# Patient Record
Sex: Female | Born: 1960 | Race: White | Hispanic: No | Marital: Married | Smoking: Never smoker
Health system: Southern US, Community
[De-identification: ages and names within clinical notes are randomized; demographics above are authoritative.]

## PROBLEM LIST (undated history)

## (undated) DIAGNOSIS — G8929 Other chronic pain: Secondary | ICD-10-CM

## (undated) DIAGNOSIS — J189 Pneumonia, unspecified organism: Secondary | ICD-10-CM

## (undated) DIAGNOSIS — R51 Headache: Secondary | ICD-10-CM

## (undated) DIAGNOSIS — M12812 Other specific arthropathies, not elsewhere classified, left shoulder: Secondary | ICD-10-CM

## (undated) DIAGNOSIS — R002 Palpitations: Secondary | ICD-10-CM

## (undated) DIAGNOSIS — J45909 Unspecified asthma, uncomplicated: Secondary | ICD-10-CM

## (undated) DIAGNOSIS — G35 Multiple sclerosis: Secondary | ICD-10-CM

## (undated) DIAGNOSIS — R519 Headache, unspecified: Secondary | ICD-10-CM

## (undated) DIAGNOSIS — F419 Anxiety disorder, unspecified: Secondary | ICD-10-CM

## (undated) DIAGNOSIS — K589 Irritable bowel syndrome without diarrhea: Secondary | ICD-10-CM

## (undated) HISTORY — PX: ROTATOR CUFF REPAIR: SHX139

## (undated) HISTORY — PX: CHOLECYSTECTOMY: SHX55

## (undated) HISTORY — PX: OTHER SURGICAL HISTORY: SHX169

## (undated) HISTORY — PX: ABDOMINAL HYSTERECTOMY: SHX81

## (undated) HISTORY — PX: APPENDECTOMY: SHX54

---

## 2001-03-21 ENCOUNTER — Ambulatory Visit (HOSPITAL_COMMUNITY): Admission: AD | Admit: 2001-03-21 | Discharge: 2001-03-21 | Payer: Self-pay | Admitting: *Deleted

## 2001-04-09 ENCOUNTER — Inpatient Hospital Stay (HOSPITAL_COMMUNITY): Admission: RE | Admit: 2001-04-09 | Discharge: 2001-04-12 | Payer: Self-pay | Admitting: Obstetrics and Gynecology

## 2001-10-01 ENCOUNTER — Emergency Department (HOSPITAL_COMMUNITY): Admission: EM | Admit: 2001-10-01 | Discharge: 2001-10-01 | Payer: Self-pay | Admitting: Emergency Medicine

## 2002-03-27 ENCOUNTER — Encounter: Payer: Self-pay | Admitting: Internal Medicine

## 2002-03-27 ENCOUNTER — Inpatient Hospital Stay (HOSPITAL_COMMUNITY): Admission: EM | Admit: 2002-03-27 | Discharge: 2002-04-01 | Payer: Self-pay | Admitting: Internal Medicine

## 2002-03-31 ENCOUNTER — Encounter: Payer: Self-pay | Admitting: Internal Medicine

## 2002-05-13 ENCOUNTER — Ambulatory Visit (HOSPITAL_COMMUNITY): Admission: RE | Admit: 2002-05-13 | Discharge: 2002-05-13 | Payer: Self-pay | Admitting: Internal Medicine

## 2002-05-16 ENCOUNTER — Emergency Department (HOSPITAL_COMMUNITY): Admission: EM | Admit: 2002-05-16 | Discharge: 2002-05-16 | Payer: Self-pay | Admitting: Emergency Medicine

## 2002-10-03 ENCOUNTER — Emergency Department (HOSPITAL_COMMUNITY): Admission: EM | Admit: 2002-10-03 | Discharge: 2002-10-04 | Payer: Self-pay | Admitting: Emergency Medicine

## 2002-10-03 ENCOUNTER — Encounter: Payer: Self-pay | Admitting: Emergency Medicine

## 2002-10-11 ENCOUNTER — Encounter: Payer: Self-pay | Admitting: Emergency Medicine

## 2002-10-11 ENCOUNTER — Emergency Department (HOSPITAL_COMMUNITY): Admission: EM | Admit: 2002-10-11 | Discharge: 2002-10-11 | Payer: Self-pay | Admitting: Emergency Medicine

## 2003-02-24 ENCOUNTER — Emergency Department (HOSPITAL_COMMUNITY): Admission: EM | Admit: 2003-02-24 | Discharge: 2003-02-24 | Payer: Self-pay | Admitting: Emergency Medicine

## 2003-02-24 ENCOUNTER — Encounter: Payer: Self-pay | Admitting: Emergency Medicine

## 2003-07-10 ENCOUNTER — Encounter: Payer: Self-pay | Admitting: Emergency Medicine

## 2003-07-10 ENCOUNTER — Emergency Department (HOSPITAL_COMMUNITY): Admission: EM | Admit: 2003-07-10 | Discharge: 2003-07-10 | Payer: Self-pay | Admitting: Emergency Medicine

## 2003-09-23 ENCOUNTER — Ambulatory Visit (HOSPITAL_COMMUNITY): Admission: RE | Admit: 2003-09-23 | Discharge: 2003-09-23 | Payer: Self-pay | Admitting: Obstetrics and Gynecology

## 2003-11-17 ENCOUNTER — Emergency Department (HOSPITAL_COMMUNITY): Admission: EM | Admit: 2003-11-17 | Discharge: 2003-11-17 | Payer: Self-pay | Admitting: Emergency Medicine

## 2004-03-14 ENCOUNTER — Emergency Department (HOSPITAL_COMMUNITY): Admission: EM | Admit: 2004-03-14 | Discharge: 2004-03-15 | Payer: Self-pay | Admitting: Emergency Medicine

## 2004-06-01 ENCOUNTER — Inpatient Hospital Stay (HOSPITAL_COMMUNITY): Admission: RE | Admit: 2004-06-01 | Discharge: 2004-06-04 | Payer: Self-pay | Admitting: Obstetrics & Gynecology

## 2005-05-23 ENCOUNTER — Emergency Department (HOSPITAL_COMMUNITY): Admission: EM | Admit: 2005-05-23 | Discharge: 2005-05-23 | Payer: Self-pay | Admitting: Emergency Medicine

## 2005-07-26 ENCOUNTER — Ambulatory Visit (HOSPITAL_COMMUNITY): Admission: RE | Admit: 2005-07-26 | Discharge: 2005-07-26 | Payer: Self-pay | Admitting: Family Medicine

## 2005-08-18 ENCOUNTER — Ambulatory Visit (HOSPITAL_COMMUNITY): Admission: RE | Admit: 2005-08-18 | Discharge: 2005-08-18 | Payer: Self-pay | Admitting: Family Medicine

## 2005-09-07 ENCOUNTER — Ambulatory Visit (HOSPITAL_COMMUNITY): Admission: RE | Admit: 2005-09-07 | Discharge: 2005-09-07 | Payer: Self-pay | Admitting: Obstetrics & Gynecology

## 2006-01-28 ENCOUNTER — Emergency Department (HOSPITAL_COMMUNITY): Admission: EM | Admit: 2006-01-28 | Discharge: 2006-01-28 | Payer: Self-pay | Admitting: Emergency Medicine

## 2006-03-29 ENCOUNTER — Emergency Department (HOSPITAL_COMMUNITY): Admission: EM | Admit: 2006-03-29 | Discharge: 2006-03-29 | Payer: Self-pay | Admitting: Emergency Medicine

## 2007-03-09 ENCOUNTER — Encounter: Admission: RE | Admit: 2007-03-09 | Discharge: 2007-03-09 | Payer: Self-pay | Admitting: Obstetrics and Gynecology

## 2007-09-24 ENCOUNTER — Emergency Department (HOSPITAL_COMMUNITY): Admission: EM | Admit: 2007-09-24 | Discharge: 2007-09-24 | Payer: Self-pay | Admitting: Emergency Medicine

## 2008-05-26 ENCOUNTER — Emergency Department (HOSPITAL_COMMUNITY): Admission: EM | Admit: 2008-05-26 | Discharge: 2008-05-26 | Payer: Self-pay | Admitting: Emergency Medicine

## 2008-11-15 ENCOUNTER — Emergency Department (HOSPITAL_COMMUNITY): Admission: EM | Admit: 2008-11-15 | Discharge: 2008-11-15 | Payer: Self-pay | Admitting: Emergency Medicine

## 2008-11-17 ENCOUNTER — Inpatient Hospital Stay (HOSPITAL_COMMUNITY): Admission: AD | Admit: 2008-11-17 | Discharge: 2008-11-23 | Payer: Self-pay | Admitting: Family Medicine

## 2009-08-11 ENCOUNTER — Emergency Department (HOSPITAL_COMMUNITY): Admission: EM | Admit: 2009-08-11 | Discharge: 2009-08-11 | Payer: Self-pay | Admitting: Emergency Medicine

## 2009-11-19 ENCOUNTER — Emergency Department (HOSPITAL_COMMUNITY): Admission: EM | Admit: 2009-11-19 | Discharge: 2009-11-19 | Payer: Self-pay | Admitting: Emergency Medicine

## 2010-04-30 ENCOUNTER — Emergency Department (HOSPITAL_COMMUNITY): Admission: EM | Admit: 2010-04-30 | Discharge: 2010-04-30 | Payer: Self-pay | Admitting: Emergency Medicine

## 2010-08-17 ENCOUNTER — Emergency Department (HOSPITAL_COMMUNITY)
Admission: EM | Admit: 2010-08-17 | Discharge: 2010-08-17 | Payer: Self-pay | Source: Home / Self Care | Admitting: Emergency Medicine

## 2010-09-08 ENCOUNTER — Encounter: Admission: RE | Admit: 2010-09-08 | Discharge: 2010-09-08 | Payer: Self-pay | Admitting: Specialist

## 2010-09-27 ENCOUNTER — Encounter
Admission: RE | Admit: 2010-09-27 | Discharge: 2010-10-27 | Payer: Self-pay | Source: Home / Self Care | Attending: Specialist | Admitting: Specialist

## 2010-12-05 ENCOUNTER — Encounter: Payer: Self-pay | Admitting: Obstetrics and Gynecology

## 2011-02-18 LAB — BASIC METABOLIC PANEL
BUN: 10 mg/dL (ref 6–23)
CO2: 27 mEq/L (ref 19–32)
Calcium: 9.7 mg/dL (ref 8.4–10.5)
Chloride: 105 mEq/L (ref 96–112)
Creatinine, Ser: 0.93 mg/dL (ref 0.4–1.2)
GFR calc Af Amer: >60 mL/min (ref >60)
GFR calc non Af Amer: >60 mL/min (ref >60)
Glucose, Bld: 98 mg/dL (ref 70–99)
Potassium: 4 mEq/L (ref 3.5–5.1)
Sodium: 141 mEq/L (ref 135–145)

## 2011-02-18 LAB — CBC
HCT: 43.9 % (ref 36.0–46.0)
Hemoglobin: 15.1 g/dL — ABNORMAL HIGH (ref 12.0–15.0)
MCHC: 34.3 g/dL (ref 30.0–36.0)
MCV: 89 fL (ref 78.0–100.0)
Platelets: 289 10*3/uL (ref 150–400)
RBC: 4.94 MIL/uL (ref 3.87–5.11)
RDW: 13.4 % (ref 11.5–15.5)
WBC: 8.8 10*3/uL (ref 4.0–10.5)

## 2011-02-18 LAB — POCT CARDIAC MARKERS
CKMB, poc: <1 ng/mL — ABNORMAL LOW (ref 1.0–8.0)
Myoglobin, poc: 68.9 ng/mL (ref 12–200)
Troponin i, poc: <0.05 ng/mL (ref 0.00–0.09)

## 2011-02-18 LAB — D-DIMER, QUANTITATIVE: D-Dimer, Quant: 0.57 ug/mL-FEU — ABNORMAL HIGH (ref 0.00–0.48)

## 2011-02-18 LAB — DIFFERENTIAL
Basophils Absolute: 0.1 10*3/uL (ref 0.0–0.1)
Basophils Relative: 1 % (ref 0–1)
Eosinophils Absolute: 0.1 10*3/uL (ref 0.0–0.7)
Eosinophils Relative: 2 % (ref 0–5)
Lymphocytes Relative: 30 % (ref 12–46)
Lymphs Abs: 2.7 10*3/uL (ref 0.7–4.0)
Monocytes Absolute: 0.5 10*3/uL (ref 0.1–1.0)
Monocytes Relative: 5 % (ref 3–12)
Neutro Abs: 5.4 10*3/uL (ref 1.7–7.7)
Neutrophils Relative %: 62 % (ref 43–77)

## 2011-02-18 LAB — HEPATIC FUNCTION PANEL
ALT: 17 U/L (ref 0–35)
Alkaline Phosphatase: 88 U/L (ref 39–117)
Bilirubin, Direct: 0.1 mg/dL (ref 0.0–0.3)
Total Bilirubin: 0.7 mg/dL (ref 0.3–1.2)

## 2011-02-18 LAB — URINALYSIS, ROUTINE W REFLEX MICROSCOPIC
Bilirubin Urine: NEGATIVE
Ketones, ur: NEGATIVE mg/dL
Nitrite: NEGATIVE
Urobilinogen, UA: 0.2 mg/dL (ref 0.0–1.0)

## 2011-02-28 LAB — CBC
HCT: 45.6 % (ref 36.0–46.0)
MCHC: 33.1 g/dL (ref 30.0–36.0)
MCV: 88.1 fL (ref 78.0–100.0)
MCV: 89.8 fL (ref 78.0–100.0)
Platelets: 251 10*3/uL (ref 150–400)
Platelets: 329 10*3/uL (ref 150–400)
RDW: 13.3 % (ref 11.5–15.5)
RDW: 13.6 % (ref 11.5–15.5)
WBC: 10.4 10*3/uL (ref 4.0–10.5)
WBC: 23.4 10*3/uL — ABNORMAL HIGH (ref 4.0–10.5)

## 2011-02-28 LAB — GLUCOSE, CAPILLARY
Glucose-Capillary: 133 mg/dL — ABNORMAL HIGH (ref 70–99)
Glucose-Capillary: 141 mg/dL — ABNORMAL HIGH (ref 70–99)
Glucose-Capillary: 142 mg/dL — ABNORMAL HIGH (ref 70–99)
Glucose-Capillary: 143 mg/dL — ABNORMAL HIGH (ref 70–99)
Glucose-Capillary: 149 mg/dL — ABNORMAL HIGH (ref 70–99)
Glucose-Capillary: 152 mg/dL — ABNORMAL HIGH (ref 70–99)
Glucose-Capillary: 154 mg/dL — ABNORMAL HIGH (ref 70–99)
Glucose-Capillary: 169 mg/dL — ABNORMAL HIGH (ref 70–99)
Glucose-Capillary: 170 mg/dL — ABNORMAL HIGH (ref 70–99)
Glucose-Capillary: 174 mg/dL — ABNORMAL HIGH (ref 70–99)
Glucose-Capillary: 179 mg/dL — ABNORMAL HIGH (ref 70–99)
Glucose-Capillary: 185 mg/dL — ABNORMAL HIGH (ref 70–99)
Glucose-Capillary: 188 mg/dL — ABNORMAL HIGH (ref 70–99)

## 2011-02-28 LAB — BLOOD GAS, ARTERIAL
Bicarbonate: 23.8 mEq/L (ref 20.0–24.0)
FIO2: 0.21 %
TCO2: 19.9 mmol/L (ref 0–100)
pCO2 arterial: 31.4 mmHg — ABNORMAL LOW (ref 35.0–45.0)
pH, Arterial: 7.492 — ABNORMAL HIGH (ref 7.350–7.400)

## 2011-02-28 LAB — DIFFERENTIAL
Basophils Absolute: 0 10*3/uL (ref 0.0–0.1)
Basophils Absolute: 0.4 10*3/uL — ABNORMAL HIGH (ref 0.0–0.1)
Basophils Relative: 0 % (ref 0–1)
Basophils Relative: 2 % — ABNORMAL HIGH (ref 0–1)
Eosinophils Absolute: 0 10*3/uL (ref 0.0–0.7)
Eosinophils Absolute: 0.1 10*3/uL (ref 0.0–0.7)
Eosinophils Relative: 0 % (ref 0–5)
Eosinophils Relative: 1 % (ref 0–5)
Lymphs Abs: 1.8 10*3/uL (ref 0.7–4.0)
Lymphs Abs: 2.8 10*3/uL (ref 0.7–4.0)
Neutrophils Relative %: 67 % (ref 43–77)
Neutrophils Relative %: 86 % — ABNORMAL HIGH (ref 43–77)

## 2011-02-28 LAB — BASIC METABOLIC PANEL
BUN: 11 mg/dL (ref 6–23)
BUN: 14 mg/dL (ref 6–23)
Chloride: 104 mEq/L (ref 96–112)
Chloride: 106 mEq/L (ref 96–112)
Creatinine, Ser: 0.91 mg/dL (ref 0.4–1.2)
Creatinine, Ser: 1 mg/dL (ref 0.4–1.2)
GFR calc Af Amer: >60 mL/min (ref >60)
GFR calc non Af Amer: 60 mL/min — ABNORMAL LOW (ref >60)
Glucose, Bld: 87 mg/dL (ref 70–99)
Potassium: 3.5 mEq/L (ref 3.5–5.1)

## 2011-02-28 LAB — CULTURE, BLOOD (ROUTINE X 2)
Culture: NO GROWTH
Report Status: 1092010

## 2011-03-29 NOTE — Group Therapy Note (Signed)
NAME:  Kristine Miller, Kristine Miller                ACCOUNT NO.:  [id number]   MEDICAL RECORD NO.:  [id number]          PATIENT TYPE:  INP   LOCATION:  A301                          FACILITY:  [facility]   PHYSICIAN:  Scott A. Gerda Diss, MD    DATE OF BIRTH:  19-Feb-1961   DATE OF PROCEDURE:  DATE OF DISCHARGE:                                 PROGRESS NOTE   Tia Hieronymus, she is actually improving today.  Her lungs sound better.  She does not have any wheezing.  Her sugars are a little high.  I really  think that is related to her steroids.  We are going to actually stop  the IV steroids and get a p.o. steroid.  Her sats initially was 91, when  she took a few deep breaths, it went on to 96.  Her lab work from  yesterday and x-ray actually showed improvement in the pneumonia.  I  think it is possible for her to go home later today, if her sats are  doing well.  We will see how things are going.  We will do a Humulin  regular insulin coverage; just in case, she needs it for a sliding scale  at meal times.  Now, albuterol neb treatments, she has a neb machine  that she can borrow.  She  b.i.d.  She will follow up with [place] later in  the weekend.      Scott A. Gerda Diss, MD  Electronically Signed     SAL/MEDQ  D:  11/22/2008  T:  11/22/2008  Job:  [id number]

## 2011-03-29 NOTE — H&P (Signed)
NAME:  Kristine Miller, Kristine Miller                ACCOUNT NO.:  [id number]   MEDICAL RECORD NO.:  [id number]          PATIENT TYPE:  INP   LOCATION:  A301                          FACILITY:  [facility]   PHYSICIAN:  Scott A. Gerda Diss, MD    DATE OF BIRTH:  December 09, 1960   DATE OF ADMISSION:  11/17/2008  DATE OF DISCHARGE:  LH                              HISTORY & PHYSICAL   CHIEF COMPLAINT:  Coughing, difficulty breathing.   HISTORY OF PRESENT ILLNESS:  This patient who is 50 years old presents  with coughing and congestion for several days.  She has had this going  on for at least 6 days with a runny nose, headache, sore throat,  coughing, congestion and low grade fever.  She went to the emergency  department 2 days ago, had an x-ray done that showed a right middle lobe  pneumonia and they gave her an inhaler along with antibiotics and  instructed her to follow up here in the office.  Over the weekend, she  has used nebulizer treatment.  In addition to this, she has also used  antibiotics and she feels worse.  She feels lightheaded today like she  cannot breathe.  She feels nauseous, and she also states that she gets  short winded with minimal activity.  She relates she is urinating okay.  She feels hot and cold, but she has not been taking her temperature, and  she denies any abdominal pain.  She relates a little bit of a headache  but nothing severe.  No neck pain.   PAST MEDICAL HISTORY:  Has had asthma for several years, does not smoke  or drink.   PAST SURGICAL HISTORY:  1. Hysterectomy.  2. Gallbladder surgery.  3. Appendectomy.  4. History of C-sections remotely.   FAMILY HISTORY:  Colon cancer, hypertension, diabetes, heart disease and  cholesterol.   ALLERGIES:  MORPHINE AND PENICILLIN CAUSING NAUSEA AND VOMITING.   MEDICATIONS:  She states currently has been taking a Z-pack as well as  albuterol p.r.n.   REVIEW OF SYSTEMS:  See per above.   PHYSICAL EXAMINATION:  GENERAL:  Looks to  feel ill.  Is tachypneic in  the office, breathing in the mid 20s.  Audible expiratory wheezes and  anxious look on her face.  HEENT:  TMs -NL.  T-NL.  NECK:  Supple.  CHEST:  Bilateral expiratory wheezes with some difficulty moving air.  HEART:  Regular.  ABDOMEN:  Soft.  EXTREMITIES:  No edema.  SKIN:  Warm, dry.  NEUROLOGIC:  Grossly normal.   STUDIES:  Chest x-ray from a couple days ago definitely showed right  middle lobe pneumonia.  Blood gas shows O2 sat 94, CO2 is a little low  pH is okay.  She was given a neb treatment in the office and continued  to have significant coughing, wheezing and shortness of breath.  She was  admitted because of failed outpatient management.   ASSESSMENT/PLAN:  Pneumonia with reactive airway.  Solu-Medrol IV as  well as Levaquin IV plus also frequent albuterol treatments.  Monitor  closely.  Xanax  as needed for nerves.  Lab work ordered, pending  currently.  Expect the patient to the in the hospital at least for the  next few days and we will go from there.      Scott A. Gerda Diss, MD  Electronically Signed     SAL/MEDQ  D:  11/17/2008  T:  11/18/2008  Job:  [id number]

## 2011-03-29 NOTE — Discharge Summary (Signed)
NAME:  Kristine Miller, Kristine Miller                ACCOUNT NO.:  [id number]   MEDICAL RECORD NO.:  [id number]          PATIENT TYPE:  INP   LOCATION:  A301                          FACILITY:  [facility]   PHYSICIAN:  Scott A. Gerda Diss, MD    DATE OF BIRTH:  Nov 05, 1961   DATE OF ADMISSION:  11/17/2008  DATE OF DISCHARGE:  01/10/2010LH                               DISCHARGE SUMMARY   DISCHARGE DIAGNOSES:  1. Pneumonia.  2. Reactive airway.  3. Hypoxia.   HOSPITAL COURSE:  The patient was admitted with cough, congestion,  wheezing, and difficulty breathing, was admitted in and placed on  antibiotics, steroids, and frequent neb treatments.  The patient did  have a hyperglycemic effect and required some insulin.  The patient  gradually improved over the course of the next few days.  She was  exceptionally tight for multiple days and then finally on Friday and  Saturday, the lungs started improving on Saturday.  We were able to  taper off the steroids and on Sunday, she was felt stable to go home,  went home on doxycycline b.i.d. for 7 days, sliding scale as needed, and  albuterol frequent treatments and follow up in our office.  She was  instructed to call us if any particular problems.  Otherwise, followup  in the office in midportion of the week.      Scott A. Gerda Diss, MD  Electronically Signed     SAL/MEDQ  D:  11/23/2008  T:  11/23/2008  Job:  [id number]

## 2011-04-01 NOTE — Discharge Summary (Signed)
[facility]  Patient:    Kristine Miller, Kristine Miller Visit Number: [id number] MRN: [id number]          Service Type: MED Location: 2A A219 01 Attending Physician:  Evlyn Courier Dictated by:   Mirna Mires, M.D. Admit Date:  03/27/2002 Discharge Date: 04/01/2002                             Discharge Summary  INCOMPLETE Dictated by:   Mirna Mires, M.D. Attending Physician:  Evlyn Courier DD:  04/03/02 TD:  04/05/02 Job: [id number]

## 2011-04-01 NOTE — H&P (Signed)
NAME:  MITCHELL, EPLING                ACCOUNT NO.:  [id number]   MEDICAL RECORD NO.:  [id number]          PATIENT TYPE:  AMB   LOCATION:  DAY                           FACILITY:  [facility]   PHYSICIAN:  Lazaro Arms, M.D.   DATE OF BIRTH:  [birth year]   DATE OF ADMISSION:  08/31/2005  DATE OF DISCHARGE:  LH                                HISTORY & PHYSICAL   Senaida is a 50 year old white female status post TAH and BSO in July 2005 who  presented sometime ago with complaint of pelvic pain.  She presented to  [facility] and had a negative workup.  They did a CT scan  which revealed a fluid collection in the left pelvis of 7.2 x 11.2 cm.  I am  suspicious at this point that this is a peritoneal occlusion cyst.  She had  some white cells in her urine treated with Cipro, and she really has gotten  no better.  CA-125 is normal.  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  The patient understands this is a result of adhesions and  fluid collection between the adhesions of bowel and pelvic sidewall and  peritoneal surfaces.  She also understands this may actually worsen the  condition long term, but she wants to try laparoscopic correction.   PAST MEDICAL HISTORY:  Negative.   PAST SURGICAL HISTORY:  1.  C section x2.  2.  Appendectomy.  3.  D&C x2.  4.  Laparoscopy for ectopic.  5.  Two arm surgeries.  6.  At the time of TAH-BSO, she did have quite a bit of adhesive disease.   ALLERGIES:  AMOXICILLIN, PENICILLIN, and MORPHINE.   REVIEW OF SYSTEMS:  As per HPI, otherwise negative.   PHYSICAL EXAMINATION:  HEENT: Unremarkable.  NECK:  Thyroid is normal.  LUNGS: Clear.  HEART:  Regular rate and rhythm without murmur, regurgitation, or gallop.  BREASTS:  Without masses, discharge, or skin changes.  ABDOMEN:  Benign without hepatosplenomegaly or masses.  PELVIC: She has normal external  genitalia.  Vagina clean, without discharge.  Vaginal cuff is intact but tender to palpation, and the mass is palpable on  examination.  It is sort of soft and pliable, consistent with a peritoneal  occlusion cyst.  EXTREMITIES:  Warm with no edema.  NEUROLOGIC: Exam is grossly intact.   IMPRESSION:  1.  Multiseptated pelvic mass status post total abdominal hysterectomy and      bilateral salpingo-oophorectomy.  2.  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  3.  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   PLAN:  The patient is admitted for laparoscopic evaluation and appropriate  surgical management of this.  She understands there are probably extensive  adhesions resulting in the fluid collection  She also knows I will try to  avoid doing a laparotomy, but that could become necessary should I be unable  to take care of it laparoscopically.      Omelia Blackwater  Lauretta Chester, M.D.  Electronically Signed     LHE/MEDQ  D:  09/06/2005  T:  09/06/2005  Job:  [id number]

## 2011-04-01 NOTE — Procedures (Signed)
[facility]  Patient:    Kristine Miller, Kristine Miller Visit Number: [id number] MRN: [id number]          Service Type: MED Location: 2A A219 01 Attending Physician:  Evlyn Courier Dictated by:   Kari Baars, M.D. Proc. Date: 03/27/02 Admit Date:  03/27/2002 Discharge Date: 04/01/2002                            EKG Interpretations  DATE:  Mar 27, 2002, at 1705 hours.  PROCEDURE:  Electrocardiogram  PHYSICIAN:  Dr. Kari Baars  DESCRIPTION OF PROCEDURE:  The rhythm is a sinus tachycardia with a rate of about 105.  There are Q-waves inferiorly which is possibly indicative of an inferior myocardial infarction of undetermined age.  There are also Q-waves laterally, which may be secondary to a lateral myocardial infarction, of undetermined age.  IMPRESSION:  Abnormal electrocardiogram. Dictated by:   Kari Baars, M.D. Attending Physician:  Evlyn Courier DD:  04/20/02 TD:  04/22/02 Job: [id number]

## 2011-04-01 NOTE — Op Note (Signed)
NAME:  Kristine Miller, Kristine Miller                            ACCOUNT NO.:  [id number]   MEDICAL RECORD NO.:  [id number]                  PATIENT TYPE:   LOCATION:                                       FACILITY:   PHYSICIAN:  Lazaro Arms, M.D.                DATE OF BIRTH:   DATE OF PROCEDURE:  06/01/2004  DATE OF DISCHARGE:                                 OPERATIVE REPORT   PREOPERATIVE DIAGNOSES:  1. Pelvic pain.  2. Menometrorrhagia.  3. Dysmenorrhea.  4. Unresponsive to nonsteroidal antiinflammatory drugs and oral     contraceptive pills.   POSTOPERATIVE DIAGNOSES:  1. Pelvic pain.  2. Menometrorrhagia.  3. Dysmenorrhea.  4. Unresponsive to nonsteroidal antiinflammatory drugs and oral     contraceptive pills.   PROCEDURE:  Abdominal hysterectomy and removal of tubes and ovaries  bilaterally.   SURGEON:  Lazaro Arms, M.D.   ANESTHESIA:  General endotracheal.   FINDINGS:  The patient had a minimal adhesions from her previous C-sections.  She had what appeared to be normal ovaries bilaterally.  She did have a  densely adherent bladder to the lower uterine segment from her 2 c-sections,  but there were no other abnormalities noted in the pelvis or peritoneal  cavity.   DESCRIPTION OF PROCEDURE:  The patient was taken to the operating room and  placed in the supine position where she underwent general endotracheal  anesthesia.  The vagina was prepped and the Foley catheter was placed.  The  abdomen was prepped and draped in the usual sterile fashion.  A Pfannenstiel  skin incision was made and carried down sharply through the rectus fascia  which was scored in the midline and extended laterally.  The fascia was  taken off the muscles superiorly and inferiorly.  The muscles were divided.  The peritoneal cavity was entered.   The upper abdomen was packed away and a Balfour self-retaining retractor was  placed.  The uterus was grasped with the thyroid tenaculum, and the fundus.  The gyrus forceps coagulation system was used and the infundibulopelvic  ligament was clamped, coagulated, and cut bilaterally.  The round ligaments  were clamped, coagulated, and cut bilaterally.  The vesicouterine serosal  flap was created both sharply and bluntly; and, again, there dense adhesions  in the midline of the bladder to the lower uterine segment.  The uterine  vessels were skeletonized.  They were clamped, coagulated, and cut; and  serial pedicles were taken down the cervix through the cardinal ligament.  Each pedicle was clamped, coagulated, and cut.  The vaginal angles were  clamped with Heaney forceps and vaginal angle sutures were placed.  The  vagina was closed with interrupted figure-of-eight sutures.  There was good  hemostasis.   The peritoneal cavity was irrigated vigorously.  All pedicles were found to  be hemostatic. The lap tapes and the Balfour self-retaining retractor was  removed.  INTERCEED was placed in the vaginal cuff to prevent postoperative  adhesions.  The muscles were reapproximated loosely.  The fascia was closed  using #0 Vicryl.  A subcu pain pump was placed for postoperative pain  management.  The subcu tissue was reapproximated loosely.  The skin was  closed using skin staples.  The patient tolerated the procedure well.  She  experienced 400 cc of blood loss and was taken to the recovery room in good  stable condition.  All counts were correct.      ___________________________________________                                            Lazaro Arms, M.D.   LHE/MEDQ  D:  06/01/2004  T:  06/01/2004  Job:  [id number]

## 2011-04-01 NOTE — Op Note (Signed)
NAME:  Kristine Miller, Kristine Miller                ACCOUNT NO.:  [id number]   MEDICAL RECORD NO.:  [id number]          PATIENT TYPE:  AMB   LOCATION:  DAY                           FACILITY:  [facility]   PHYSICIAN:  Lazaro Arms, M.D.   DATE OF BIRTH:  May 11, 1961   DATE OF PROCEDURE:  09/07/2005  DATE OF DISCHARGE:                                 OPERATIVE REPORT   PREOPERATIVE DIAGNOSES:  1.  Large, multiloculated cystic pelvic mass.  2.  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  3.  Probable pelvic abdominal adhesive disease.   PROCEDURE:  Operative laparoscopy with extensive lysis of adhesions and  excision of peritoneal inclusive cyst.   SURGEON:  Dr. Despina Hidden.   ANESTHESIA:  General endotracheal.   FINDINGS:  The patient had omentum adherent to the anterior abdominal wall.  She also had adhesions of the sigmoid colon and rectum to the bladder, the  left pelvic side wall and small bowel adherent to the right pelvic side  wall. It was indeed a peritoneal inclusion cyst. It was just loculated, thin  membrane by peritoneum and straw-colored serous fluid. It was opened up and  drained and adhesions taken down extensively.   DESCRIPTION OF OPERATION:  The patient was taken to the operating room and  placed in a supine position where she underwent general endotracheal  anesthesia. She was placed in the dorsal lithotomy position, prepped and  draped in the usual sterile fashion. A sterile sponge stick was placed in  the vagina for manipulation if needed. An incision was made in the  umbilicus, and an open laparoscopy was performed. Dissection was taken down  to the fascia, and the fascia was cut, and the peritoneum was entered  bluntly. The peritoneal cavity was insufflated, and two additional trocars  were placed two fingerbreadths above the pubis in the midline and in the  left lower quadrant, both under direct visualization. The above noted  findings were seen. Adhesions of the anterior abdominal wall  and the omentum  were taken down with the harmonic scalpel. There was adhesions of the ileum  on the right pelvic side wall, and there were taken down bluntly and  sharply. Sigmoid colon and rectum were densely adherent to the bladder, and  these were taken down bluntly and with use of harmonic scalpel, careful of  course to avoid the mucosa. The peritoneal inclusion cyst basically went  from the left adnexa all the way down to the cul-de-sac from bladder to  rectum. It was opened up, and all of the fluid was removed. Peritoneal  membrane was excised back. The sigmoid was attempted to be removed from the  left pelvic side wall, but the adhesions were two dense. If this becomes a  problem again in the future, she is going to need to have an open  laparotomy. A large amount of saline was left in place to try to prevent  postoperative adhesive disease from reoccurring and causing peritoneal  inclusive cyst once again. All the instruments were removed from the  peritoneal cavity. The umbilical fascia was closed with 0 Vicryl,  and a subcuticular incision of 3-0 Vicryl was placed in the umbilicus. Skin  was closed with skin staples. The patient tolerated the procedure well. She  experienced minimal to no blood loss and was taken to the recovery room in  good and stable condition. All counts were correct.      Lazaro Arms, M.D.  Electronically Signed     LHE/MEDQ  D:  09/07/2005  T:  09/07/2005  Job:  [id number]

## 2011-04-01 NOTE — Discharge Summary (Signed)
NAME:  Kristine Miller, Kristine Miller                          ACCOUNT NO.:  [id number]   MEDICAL RECORD NO.:  [id number]                   PATIENT TYPE:  INP   LOCATION:  A427                                 FACILITY:  [facility]   PHYSICIAN:  Lazaro Arms, M.D.                DATE OF BIRTH:  [birth year]   DATE OF ADMISSION:  06/01/2004  DATE OF DISCHARGE:  06/04/2004                                 DISCHARGE SUMMARY   DISCHARGE DIAGNOSES:  1. Status post total abdominal hysterectomy bilateral salpingo-oophorectomy.  2. Unremarkable course.  3. Pathology report reveals adenomyosis of the uterus and hemorrhagic corpus     luteum cyst right ovary.   Please refer to the transcribed operative report as well as the handwritten  history and physical for details of admission to the hospital.   HOSPITAL COURSE:  The patient was admitted postoperatively.  She did quite  well. She tolerated clear liquids and a regular diet.  She voided without  symptoms.  She had return of bowel function with flatus and bowel movement.  Her incision remained clean, dry, and intact.  She tolerated the transition  from IV to oral pain medicine.  On postoperative day #1 her hemoglobin 11.8,  hematocrit 33.6 with a white count of 12,400.  On postop day #3 it was 11.0  and 31.2 and 10,200 respectively.  The patient was discharged to home the  morning of postop day #3 in good stable condition to follow up in the office  on Monday the 25th to have her incision examined and staples removed.  She  was given Tylox and Motrin for pain and Cipro as an antibiotic as she has  had wound infections with her previous cesarean section.  She was given  instruction precautions prior to that time.     ___________________________________________                                         Lazaro Arms, M.D.   Loraine Maple  D:  06/04/2004  T:  06/04/2004  Job:  [id number]

## 2011-08-23 LAB — CBC
HCT: 42.3
Hemoglobin: 14.4
MCHC: 34.1
RBC: 4.79
RDW: 13.4

## 2011-08-23 LAB — DIFFERENTIAL
Basophils Absolute: 0.1
Eosinophils Relative: 1
Lymphocytes Relative: 24
Monocytes Absolute: 0.7
Monocytes Relative: 6
Neutro Abs: 9.2 — ABNORMAL HIGH

## 2011-08-23 LAB — POCT CARDIAC MARKERS
CKMB, poc: <1 — ABNORMAL LOW
Troponin i, poc: <0.05

## 2011-08-23 LAB — BASIC METABOLIC PANEL
CO2: 28
Calcium: 9
GFR calc Af Amer: >60
GFR calc non Af Amer: >60
Glucose, Bld: 104 — ABNORMAL HIGH
Potassium: 3.6
Sodium: 138

## 2011-08-26 ENCOUNTER — Other Ambulatory Visit: Payer: Self-pay | Admitting: Adult Health

## 2011-09-21 ENCOUNTER — Inpatient Hospital Stay (HOSPITAL_COMMUNITY): Admission: RE | Admit: 2011-09-21 | Payer: Self-pay | Source: Ambulatory Visit

## 2011-09-21 ENCOUNTER — Ambulatory Visit (HOSPITAL_COMMUNITY)
Admission: RE | Admit: 2011-09-21 | Discharge: 2011-09-21 | Disposition: A | Discharge status: Home / Self Care | Payer: Self-pay | Source: Ambulatory Visit | Attending: Adult Health | Admitting: Adult Health

## 2011-09-21 DIAGNOSIS — N63 Unspecified lump in unspecified breast: Secondary | ICD-10-CM | POA: Insufficient documentation

## 2012-02-23 ENCOUNTER — Emergency Department (HOSPITAL_COMMUNITY)
Admission: EM | Admit: 2012-02-23 | Discharge: 2012-02-23 | Disposition: A | Discharge status: Home / Self Care | Payer: Self-pay | Attending: Emergency Medicine | Admitting: Emergency Medicine

## 2012-02-23 ENCOUNTER — Encounter (HOSPITAL_COMMUNITY): Payer: Self-pay | Admitting: Emergency Medicine

## 2012-02-23 DIAGNOSIS — H5710 Ocular pain, unspecified eye: Secondary | ICD-10-CM | POA: Insufficient documentation

## 2012-02-23 DIAGNOSIS — H1130 Conjunctival hemorrhage, unspecified eye: Secondary | ICD-10-CM | POA: Insufficient documentation

## 2012-02-23 MED ORDER — KETOROLAC TROMETHAMINE 0.5 % OP SOLN
1.0000 [drp] | Freq: Once | OPHTHALMIC | Status: AC
  Administered 2012-02-23: 1 [drp] via OPHTHALMIC
  Filled 2012-02-23: qty 5

## 2012-02-23 NOTE — Discharge Instructions (Signed)
Subconjunctival Hemorrhage A subconjunctival hemorrhage is a bright red patch covering a portion of the white of the eye. The white part of the eye is called the sclera, and it is covered by a thin membrane called the conjunctiva. This membrane is clear, except for tiny blood vessels that you can see with the naked eye. When your eye is irritated or inflamed and becomes red, it is because the vessels in the conjunctiva are swollen. Sometimes, a blood vessel in the conjunctiva can break and bleed. When this occurs, the blood builds up between the conjunctiva and the sclera, and spreads out to create a red area. The red spot may be very small at first. It may then spread to cover a larger part of the surface of the eye, or even all of the visible white part of the eye. In almost all cases, the blood will go away and the eye will become white again. Before completely dissolving, however, the red area may spread. It may also become brownish-yellow in color, before going away. If a lot of blood collects under the conjunctiva, it may look like a bulge on the surface of the eye. This looks scary, but it will also eventually flatten out and go away. Subconjunctival hemorrhages do not cause pain, but if swollen, may cause a feeling of irritation. There is no effect on vision.  CAUSES   The most common cause is mild trauma (rubbing the eye, irritation).   Subconjunctival hemorrhages can happen because of coughing or straining (lifting heavy objects), vomiting, or sneezing.   In some cases, your doctor may want to check your blood pressure. High blood pressure can also cause a sunconjunctival hemorrhage.   Severe trauma or blunt injuries.   Diseases that affect blood clotting (hemophilia, leukemia).   Abnormalities of blood vessels behind the eye (carotid cavernous sinus fistula).   Tumors behind the eye.   Certain drugs (aspirin, coumadin, heparin).   Recent eye surgery.  HOME CARE INSTRUCTIONS   Do  not worry about the appearance of your eye. You may continue your usual activities.   Often, follow-up is not necessary.  SEEK MEDICAL CARE IF:   Your eye becomes painful.   The bleeding does not disappear within 3 weeks.   Bleeding occurs elsewhere, for example, under the skin, in the mouth, or in the other eye.   You have recurring subconjunctival hemorrhages.  SEEK IMMEDIATE MEDICAL CARE IF:   Your vision changes or you have difficulty seeing.   You develop severe headache, persistent vomiting, confusion, or abnormal drowsiness (lethargy).   Your eye seems to bulge or protrude from the eye socket.   You notice the sudden appearance of bruises, or have spontaneous bleeding elsewhere on your body.  Document Released: 10/31/2005 Document Revised: 10/20/2011 Document Reviewed: 09/28/2009 [facility] Patient Information 2012 [place], [place].   i spoke with dr. Lita Mains.  He suggests cool or warm compresses for comfort.  Insert 1 drop of acular 4 times daily if needed for pain.  Call his office on Monday and make an appt for further evaluation of the eye.

## 2012-02-23 NOTE — ED Notes (Signed)
Pt states she woke up this am and had increased eye redness and swelling.

## 2012-02-23 NOTE — ED Provider Notes (Signed)
Medical screening examination/treatment/procedure(s) were performed by non-physician practitioner and as supervising physician I was immediately available for consultation/collaboration.  Donnetta Hutching, MD 02/23/12 1735

## 2012-02-23 NOTE — ED Provider Notes (Signed)
History     CSN: [id number]  Arrival date & time 02/23/12  1344   First MD Initiated Contact with Patient 02/23/12 1611      Chief Complaint  Patient presents with  . Eye Pain    (Consider location/radiation/quality/duration/timing/severity/associated sxs/prior treatment) HPI Comments: Denies any eye trauma.  No FB sensation.  No visual changes.  Not  Taking anticoagulants.  She has mild discomfort with movement of the eye and blinking.  She has had this occur 3 times in the past few years.  Has not seen an ophthalmologist.  Patient is a 51 y.o. female presenting with eye pain. The history is provided by the patient. No language interpreter was used.  Eye Pain This is a new problem. Episode onset: 6 days ago. The problem has been gradually worsening. Pertinent negatives include no headaches, nausea, visual change or vomiting. Exacerbated by: blinking and eye movement.    History reviewed. No pertinent past medical history.  Past Surgical History  Procedure Date  . Abdominal hysterectomy     History reviewed. No pertinent family history.  History  Substance Use Topics  . Smoking status: Not on file  . Smokeless tobacco: Not on file  . Alcohol Use: No    OB History    Grav Para Term Preterm Abortions TAB SAB Ect Mult Living                  Review of Systems  Eyes: Positive for pain and redness. Negative for photophobia, discharge, itching and visual disturbance.  Gastrointestinal: Negative for nausea and vomiting.  Neurological: Negative for headaches.  All other systems reviewed and are negative.    Allergies  Morphine and related and Penicillins  Home Medications   Current Outpatient Rx  Name Route Sig Dispense Refill  . ESTRADIOL 1 MG PO TABS Oral Take 1 mg by mouth daily.      BP 150/87  Pulse 80  Temp(Src) 98.1 F (36.7 C) (Oral)  Resp 20  Wt 230 lb (104.327 kg)  SpO2 97%  Physical Exam  Nursing note and vitals reviewed. Constitutional: She  is oriented to person, place, and time. She appears well-developed and well-nourished. No distress.  HENT:  Head: Normocephalic and atraumatic.  Eyes: EOM are normal. Pupils are equal, round, and reactive to light. Left eye exhibits no discharge, no exudate and no hordeolum. No foreign body present in the left eye. Left conjunctiva has a hemorrhage. No scleral icterus.    Neck: Normal range of motion.  Cardiovascular: Normal rate, regular rhythm and normal heart sounds.   Pulmonary/Chest: Effort normal and breath sounds normal.  Abdominal: Soft. She exhibits no distension. There is no tenderness.  Musculoskeletal: Normal range of motion.  Neurological: She is alert and oriented to person, place, and time.  Skin: Skin is warm and dry.  Psychiatric: She has a normal mood and affect. Judgment normal.    ED Course  Procedures (including critical care time)  Labs Reviewed - No data to display No results found.   1. Subconjunctival hemorrhage       MDM  Acular, 1 gtt QID OS x 5 days prn discomfort.   i spoke with dr. Lita Mains. He states that she does not need anything in particular but that she can apply cool or compresses for comfort and to reassure her.  Call his office on Monday and make an appt for next week.        Worthy Rancher, PA 02/23/12 1640  Worthy Rancher, PA 02/23/12 1652  Worthy Rancher, PA 02/23/12 [phone]

## 2012-02-23 NOTE — ED Notes (Signed)
Pt DC to home with steady gait 

## 2012-04-06 ENCOUNTER — Emergency Department (HOSPITAL_COMMUNITY): Payer: Medicaid Other

## 2012-04-06 ENCOUNTER — Encounter (HOSPITAL_COMMUNITY): Payer: Self-pay

## 2012-04-06 ENCOUNTER — Inpatient Hospital Stay (HOSPITAL_COMMUNITY)
Admission: EM | Admit: 2012-04-06 | Discharge: 2012-04-07 | DRG: 313 | Disposition: A | Discharge status: Home / Self Care | Payer: Medicaid Other | Attending: Internal Medicine | Admitting: Internal Medicine

## 2012-04-06 DIAGNOSIS — R002 Palpitations: Secondary | ICD-10-CM

## 2012-04-06 DIAGNOSIS — E876 Hypokalemia: Secondary | ICD-10-CM

## 2012-04-06 DIAGNOSIS — F411 Generalized anxiety disorder: Secondary | ICD-10-CM | POA: Diagnosis present

## 2012-04-06 DIAGNOSIS — I369 Nonrheumatic tricuspid valve disorder, unspecified: Secondary | ICD-10-CM

## 2012-04-06 DIAGNOSIS — R079 Chest pain, unspecified: Principal | ICD-10-CM | POA: Diagnosis present

## 2012-04-06 DIAGNOSIS — Z6841 Body Mass Index (BMI) 40.0 and over, adult: Secondary | ICD-10-CM

## 2012-04-06 DIAGNOSIS — E039 Hypothyroidism, unspecified: Secondary | ICD-10-CM | POA: Diagnosis present

## 2012-04-06 DIAGNOSIS — E669 Obesity, unspecified: Secondary | ICD-10-CM

## 2012-04-06 DIAGNOSIS — M79609 Pain in unspecified limb: Secondary | ICD-10-CM | POA: Diagnosis present

## 2012-04-06 HISTORY — DX: Irritable bowel syndrome, unspecified: K58.9

## 2012-04-06 HISTORY — DX: Other specific arthropathies, not elsewhere classified, left shoulder: M12.812

## 2012-04-06 LAB — CBC
HCT: 40.3 % (ref 36.0–46.0)
Hemoglobin: 13.6 g/dL (ref 12.0–15.0)
RBC: 4.6 MIL/uL (ref 3.87–5.11)

## 2012-04-06 LAB — CARDIAC PANEL(CRET KIN+CKTOT+MB+TROPI)
CK, MB: 1.5 ng/mL (ref 0.3–4.0)
Relative Index: INVALID (ref 0.0–2.5)
Total CK: 59 U/L (ref 7–177)

## 2012-04-06 LAB — BASIC METABOLIC PANEL
Chloride: 104 mEq/L (ref 96–112)
GFR calc Af Amer: >90 mL/min (ref >90)
GFR calc non Af Amer: >90 mL/min (ref >90)
Potassium: 2.9 mEq/L — ABNORMAL LOW (ref 3.5–5.1)
Sodium: 141 mEq/L (ref 135–145)

## 2012-04-06 LAB — TROPONIN I: Troponin I: <0.3 ng/mL (ref <0.30)

## 2012-04-06 LAB — PRO B NATRIURETIC PEPTIDE: Pro B Natriuretic peptide (BNP): 44.5 pg/mL (ref 0–125)

## 2012-04-06 LAB — MAGNESIUM: Magnesium: 2 mg/dL (ref 1.5–2.5)

## 2012-04-06 LAB — D-DIMER, QUANTITATIVE: D-Dimer, Quant: 0.7 ug/mL-FEU — ABNORMAL HIGH (ref 0.00–0.48)

## 2012-04-06 MED ORDER — SODIUM CHLORIDE 0.9 % IJ SOLN
3.0000 mL | Freq: Two times a day (BID) | INTRAMUSCULAR | Status: DC
  Administered 2012-04-06: 3 mL via INTRAVENOUS
  Filled 2012-04-06 (×2): qty 3

## 2012-04-06 MED ORDER — ONDANSETRON HCL 4 MG/2ML IJ SOLN: 4.0000 mg | Freq: Four times a day (QID) | INTRAMUSCULAR | Status: DC | PRN

## 2012-04-06 MED ORDER — ENOXAPARIN SODIUM 40 MG/0.4ML ~~LOC~~ SOLN
40.0000 mg | SUBCUTANEOUS | Status: DC
  Administered 2012-04-06: 40 mg via SUBCUTANEOUS
  Filled 2012-04-06: qty 0.4

## 2012-04-06 MED ORDER — IOHEXOL 350 MG/ML SOLN
100.0000 mL | Freq: Once | INTRAVENOUS | Status: AC | PRN
  Administered 2012-04-06: 100 mL via INTRAVENOUS

## 2012-04-06 MED ORDER — ASPIRIN 81 MG PO CHEW
324.0000 mg | CHEWABLE_TABLET | Freq: Once | ORAL | Status: AC
  Administered 2012-04-06: 324 mg via ORAL
  Filled 2012-04-06: qty 4

## 2012-04-06 MED ORDER — SODIUM CHLORIDE 0.9 % IV SOLN
INTRAVENOUS | Status: DC
  Administered 2012-04-06: 12:00:00 via INTRAVENOUS

## 2012-04-06 MED ORDER — NITROGLYCERIN 0.4 MG SL SUBL
0.4000 mg | SUBLINGUAL_TABLET | SUBLINGUAL | Status: AC | PRN
  Administered 2012-04-06 (×3): 0.4 mg via SUBLINGUAL
  Filled 2012-04-06: qty 25

## 2012-04-06 MED ORDER — LORAZEPAM 2 MG/ML IJ SOLN
1.0000 mg | Freq: Once | INTRAMUSCULAR | Status: AC
  Administered 2012-04-06: 1 mg via INTRAVENOUS
  Filled 2012-04-06: qty 1

## 2012-04-06 MED ORDER — ASPIRIN EC 325 MG PO TBEC
325.0000 mg | DELAYED_RELEASE_TABLET | Freq: Every day | ORAL | Status: DC
  Administered 2012-04-06 – 2012-04-07 (×2): 325 mg via ORAL
  Filled 2012-04-06 (×2): qty 1

## 2012-04-06 MED ORDER — POTASSIUM CHLORIDE CRYS ER 20 MEQ PO TBCR
40.0000 meq | EXTENDED_RELEASE_TABLET | ORAL | Status: AC
  Administered 2012-04-06 (×2): 40 meq via ORAL
  Filled 2012-04-06 (×2): qty 2

## 2012-04-06 MED ORDER — FENTANYL CITRATE 0.05 MG/ML IJ SOLN
12.5000 ug | Freq: Once | INTRAMUSCULAR | Status: AC
  Administered 2012-04-06: 12.5 ug via INTRAVENOUS
  Filled 2012-04-06: qty 2

## 2012-04-06 MED ORDER — HYDROMORPHONE HCL PF 1 MG/ML IJ SOLN
0.5000 mg | INTRAMUSCULAR | Status: DC | PRN
  Administered 2012-04-07: 0.5 mg via INTRAVENOUS
  Filled 2012-04-06: qty 1

## 2012-04-06 MED ORDER — ONDANSETRON HCL 4 MG PO TABS: 4.0000 mg | ORAL_TABLET | Freq: Four times a day (QID) | ORAL | Status: DC | PRN

## 2012-04-06 MED ORDER — ACETAMINOPHEN 325 MG PO TABS
650.0000 mg | ORAL_TABLET | Freq: Four times a day (QID) | ORAL | Status: DC | PRN
  Administered 2012-04-06: 650 mg via ORAL
  Filled 2012-04-06: qty 2

## 2012-04-06 MED ORDER — ACETAMINOPHEN 650 MG RE SUPP: 650.0000 mg | Freq: Four times a day (QID) | RECTAL | Status: DC | PRN

## 2012-04-06 NOTE — ED Notes (Signed)
Patient denies cp after 2nd nitro

## 2012-04-06 NOTE — Progress Notes (Signed)
*  PRELIMINARY RESULTS* Echocardiogram 2D Echocardiogram has been performed.  Caswell Corwin 04/06/2012, 2:19 PM

## 2012-04-06 NOTE — Progress Notes (Signed)
PT HAD ONE EPISODE OF SHAKING. SHE DESCRIBED AS " SHAKING ON THE INSIDE BUT NO ONE CAN SEE IT ON THE OUTSIDE ". IT LASTED ABOUT 5 MINUTES.

## 2012-04-06 NOTE — ED Notes (Signed)
Dr Kerry Hough at bedside for patient evaluation.

## 2012-04-06 NOTE — Consult Note (Signed)
CARDIOLOGY CONSULT NOTE  Patient ID: Kristine Miller MRN: [id number] DOB/AGE: 04-15-1961 51 y.o.  Admit date: 04/06/2012 Referring Physician: PTH Primary PhysicianLUKING,SCOTT, MD, MD Reason for Consultation: Chest pain, palpitations  Principal Problem:  *Chest pain Active Problems:  Palpitations  Obesity  Hypokalemia  HPI: Kristine Miller is a 51 y/o women with no prior cardiac history who was in her usual state of health when she noticed her heart racing with frequent palpitiations. She had been up playing video games with her son until around 3:30am Went to bed and began to feel her heart race with associated shortness of breath and chest pain.  Earlier in the day she had left arm pain, on and off without exertion. She ignored this. She has a history of rotator cuff repair and has occasional shoulder pain. When she felt her heart racing the left arm began to bother her more with tingling in the hands bilaterally.   On arrival to ER, BP 119/70, HR 86. She was given NTG with relief of symptoms. She was found to be hypokalemic with potassium of 2.9. Mildly elevated WBC of 11.7. D-dimer was elevated at 0.70 with negative CT scan for PE. U/A negative for UTI. Troponin is negative with EKG showing nonspecific T-wave abnormalities without acute ischemia. She has had potassium repletion. Currently without complaint.  Of note, she has a history of frequent diarrhea after eating. Was told she may have IBS but has not had official evaluation by GI. She states that she has loose stools 2-3 times a day, chronically.  Review of systems complete and found to be negative unless listed above   Past Medical History  Diagnosis Date  . Rotator cuff arthropathy, left   . IBS (irritable bowel syndrome)     Family History  Problem Relation Age of Onset  . Cancer Mother   . Diabetes Mother     History   Social History  . Marital Status: Married    Spouse Name: N/A    Number of Children: N/A  . Years  of Education: N/A   Occupational History  . Not on file.   Social History Main Topics  . Smoking status: Never Smoker   . Smokeless tobacco: Not on file  . Alcohol Use: No  . Drug Use: No  . Sexually Active:    Other Topics Concern  . 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    Past Surgical History  Procedure Date  . Abdominal hysterectomy   . Rotator cuff repair   . Cholecystectomy   . Appendectomy       Physical Exam: Blood pressure 126/65, pulse 72, temperature 98.8 F (37.1 C), temperature source Oral, resp. rate 19, height 5' (1.524 m), weight 228 lb (103.42 kg), SpO2 100.00%.   General: Well developed, well nourished, in no acute distress Head: Eyes PERRLA, No xanthomas.   Normal cephalic and atramatic  Lungs: Clear bilaterally to auscultation and percussion. Heart: HRRR S1 S2, without MRG.  Pulses are 2+ & equal.            No carotid bruit. No JVD.  No abdominal bruits. No femoral bruits. Abdomen: Bowel sounds are positive, abdomen soft and non-tender without masses or                  Hernia's noted. Msk:  Back normal, normal gait. Normal strength and tone for age.Soreness of the left shoulder with movement. Extremities: No  clubbing, cyanosis or edema.  DP +1 Neuro: Alert and oriented X 3. Psych:  Good affect, responds appropriately   Labs:   Lab Results  Component Value Date   WBC 11.7* 04/06/2012   HGB 13.6 04/06/2012   HCT 40.3 04/06/2012   MCV 87.6 04/06/2012   PLT 287 04/06/2012     Lab 04/06/12 0419  NA 141  K 2.9*  CL 104  CO2 24  BUN 9  CREATININE 0.76  CALCIUM 9.4  PROT --  BILITOT --  ALKPHOS --  ALT --  AST --  GLUCOSE 102*   Lab Results  Component Value Date   TROPONINI <0.30 04/06/2012        Radiology: Ct Angio Chest W/cm &/or Wo Cm  04/06/2012  *RADIOLOGY REPORT*  Clinical Data: Short of breath.  Chest pain.  Clinical suspicion for pulmonary embolism.  CT ANGIOGRAPHY CHEST    IMPRESSION: Negative.  No evidence of pulmonary embolism or other active disease.  Original Report Authenticated By: Danae Orleans, M.D.   Chest Portable 1 View  04/06/2012  *RADIOLOGY REPORT*  Clinical Data: Shortness of breath, chest pain.  PORTABLE CHEST - 1 VIEW    IMPRESSION: No radiographic evidence of acute cardiopulmonary process.  Original Report Authenticated By: Waneta Martins, M.D.   ZOX:WRUEAV sinus rhythm HR 86 bpm Nonspecific ST abnormality  ASSESSMENT AND PLAN:   1. Chest pain: Typical and atypical features with associated "racing heart" and palpitations and some left arm pain.Marland Kitchen She was found to be hypokalemic with potassium of 2.9. Troponin is negative, EKG showing nonspecific T-wave abnormalities but no ischemic changes. Recommend potassium replacement and continued cycling of cardiac enzymes and EKG  to r/o MI. Family history is negative for heart disease, and she has no other risk factors. If negative work-up can go home with follow-up in our office for stress test should symptoms persist.  2. Hypokalemia:  She states that she has chronic diarrhea with loose stools after most meals. She has had a cholecystectomy in the past. GI work-up with potassium replacement at home is recommended. Addition of bile acid sequestrant should be considered.   3. Left shoulder and arm pain: Can be musculoskeletal with known history of rotator cuff repair, vs related to hypokalemia. Follow-up with PCP for further testing if necessary.  Signed: Bettey Mare. Lyman Bishop NP [facility] 04/06/2012, 9:41 AM Co-Sign MD  Attending note:  Patient seen and examined. Reviewed records and database as recorded by Kristine Miller. Kristine Miller is a 51 year old obese woman with reported history of IBS, previous left rotator cuff surgery, presenting with sudden onset of palpitations in the early morning hours associated with shortness of breath and chest discomfort. She denies any prior history of  prolonged palpitations, or any typical exertional chest pain. When assessed in the ER, she was noted to be hypokalemic as outlined, ECG showing nonspecific changes with no dysrhythmia by monitoring. She did report improvement in her chest pain symptoms with nitroglycerin, still indicates that she feels short of breath when walking around to get up to go to the bathroom.  On examination she denies any current chest pain, is afebrile, respirations 19, heart rate in the 70s and sinus rhythm, blood pressure 126/65. Lungs are clear, no carotid bruits noted, cardiac exam with regular rate and rhythm and no significant murmur, no peripheral edema.  Lab work reviewed finding potassium 2.9, BUN 69, creatinine 0.7, sodium 141, troponin I normal, pro BNP 44, WBC 11.7, hemoglobin 13.6,  d-dimer 0.7. CT angiogram of the chest showed no evidence of pulmonary embolus, also no significant coronary artery calcifications were described with normal heart size. ECG reviewed showing nonspecific ST changes.  Patient is being admitted to the hospitalist service for observation. Would cycle cardiac markers, follow telemetry, and also proceed with an echocardiogram to evaluate LV function. Replete potassium - patient describes loose stools presumably in association with IBS. She may require a potassium supplement at baseline. From a cardiac perspective, if no dysrhythmia is uncovered, cardiac markers are normal, and she is clinically stable with no major echocardiographic abnormalities, anticipate that she could be scheduled for a followup outpatient stress test next week. Whether additional monitoring needs to be considered can also be discussed.  Jonelle Sidle, M.D., F.A.C.C.

## 2012-04-06 NOTE — ED Notes (Signed)
Patient ambulatory to restroom with steady gait.

## 2012-04-06 NOTE — ED Notes (Signed)
Patient transported to CT 

## 2012-04-06 NOTE — H&P (Signed)
PCP:   Lilyan Punt, MD, MD   Chief Complaint:  Chest pain  HPI: This is a 51 year old female who presents to the emergency room today with complaints of substernal chest pain. She describes the pain as a squeezing type pain in her mid chest which radiates to her left arm. Onset of her symptoms occurred approximately 3 AM this morning she was going to bed. She felt her heart pounding and reports very feeling very tremulous. Her pain was also associated with shortness of breath. These symptoms appear to have gotten worse and she tried to get up and move around. She denies any diaphoresis, fever, cough. She has not feel this was acid reflux related. She denies any prior episode of similar symptoms. She's never had any prior cardiac testing. She is a nonsmoker. She was evaluated in the emergency room and was found to have a non acute, and initial troponin was found to be negative.  She has been referred for admission  Allergies:   Allergies  Allergen Reactions  . Morphine And Related Swelling  . Penicillins Nausea And Vomiting     History reviewed. No pertinent past medical history.  Past Surgical History  Procedure Date  . Abdominal hysterectomy     Prior to Admission medications   Medication Sig Start Date End Date Taking? Authorizing Provider  estradiol (ESTRACE) 1 MG tablet Take 1 mg by mouth daily.   Yes Historical Provider, MD    Social History:  reports that she has never smoked. She does not have any smokeless tobacco history on file. She reports that she does not drink alcohol or use illicit drugs.  No family history on file.  Review of Systems: Positives in bold Constitutional: Denies fever, chills, diaphoresis, appetite change and fatigue.  HEENT: Denies photophobia, eye pain, redness, hearing loss, ear pain, congestion, sore throat, rhinorrhea, sneezing, mouth sores, trouble swallowing, neck pain, neck stiffness and tinnitus.   Respiratory: Denies SOB, DOE, cough, chest  tightness,  and wheezing.   Cardiovascular: Denies chest pain, palpitations and leg swelling.  Gastrointestinal: Denies nausea, vomiting, abdominal pain, diarrhea, constipation, blood in stool and abdominal distention.  Genitourinary: Denies dysuria, urgency, frequency, hematuria, flank pain and difficulty urinating.  Musculoskeletal: Denies myalgias, back pain, joint swelling, arthralgias and gait problem. left arm pain Skin: Denies pallor, rash and wound.  Neurological: Denies dizziness, seizures, syncope, weakness, light-headedness, numbness and headaches.  Hematological: Denies adenopathy. Easy bruising, personal or family bleeding history  Psychiatric/Behavioral: Denies suicidal ideation, mood changes, confusion, nervousness, sleep disturbance and agitation   Physical Exam: Blood pressure 126/65, pulse 72, temperature 98.8 F (37.1 C), temperature source Oral, resp. rate 19, height 5' (1.524 m), weight 103.42 kg (228 lb), SpO2 100.00%. General: Patient is sitting up in bed, does not appear in any acute distress, alert and oriented x3 HEENT: Normocephalic, atraumatic, pupils are equal round react to light Neck: Supple Chest: Clear to auscultation bilaterally Cardiac: S1, S2, regular rate and rhythm, no palpable chest pain Abdomen: Soft, nontender, nondistended, bowel sounds are active Extremities: No cyanosis, clubbing, edema Neurologic: Grossly intact, nonfocal Skin: Intact, no visible rashes  Labs on Admission:  Results for orders placed during the hospital encounter of 04/06/12 (from the past 48 hour(s))  CBC     Status: Abnormal   Collection Time   04/06/12  4:19 AM      Component Value Range Comment   WBC 11.7 (*) 4.0 - 10.5 (K/uL)    RBC 4.60  3.87 - 5.11 (MIL/uL)  Hemoglobin 13.6  12.0 - 15.0 (g/dL)    HCT 16.1  09.6 - 04.5 (%)    MCV 87.6  78.0 - 100.0 (fL)    MCH 29.6  26.0 - 34.0 (pg)    MCHC 33.7  30.0 - 36.0 (g/dL)    RDW 40.9  81.1 - 91.4 (%)    Platelets 287   150 - 400 (K/uL)   BASIC METABOLIC PANEL     Status: Abnormal   Collection Time   04/06/12  4:19 AM      Component Value Range Comment   Sodium 141  135 - 145 (mEq/L)    Potassium 2.9 (*) 3.5 - 5.1 (mEq/L)    Chloride 104  96 - 112 (mEq/L)    CO2 24  19 - 32 (mEq/L)    Glucose, Bld 102 (*) 70 - 99 (mg/dL)    BUN 9  6 - 23 (mg/dL)    Creatinine, Ser 7.82  0.50 - 1.10 (mg/dL)    Calcium 9.4  8.4 - 10.5 (mg/dL)    GFR calc non Af Amer >90  >90 (mL/min)    GFR calc Af Amer >90  >90 (mL/min)   PRO B NATRIURETIC PEPTIDE     Status: Normal   Collection Time   04/06/12  4:19 AM      Component Value Range Comment   Pro B Natriuretic peptide (BNP) 44.5  0 - 125 (pg/mL)   TROPONIN I     Status: Normal   Collection Time   04/06/12  4:19 AM      Component Value Range Comment   Troponin I <0.30  <0.30 (ng/mL)   D-DIMER, QUANTITATIVE     Status: Abnormal   Collection Time   04/06/12  4:19 AM      Component Value Range Comment   D-Dimer, Quant 0.70 (*) 0.00 - 0.48 (ug/mL-FEU)   POCT I-STAT TROPONIN I     Status: Normal   Collection Time   04/06/12  7:13 AM      Component Value Range Comment   Troponin i, poc 0.00  0.00 - 0.08 (ng/mL)    Comment 3              Radiological Exams on Admission: Ct Angio Chest W/cm &/or Wo Cm  04/06/2012  *RADIOLOGY REPORT*  Clinical Data: Short of breath.  Chest pain.  Clinical suspicion for pulmonary embolism.  CT ANGIOGRAPHY CHEST  Technique:  Multidetector CT imaging of the chest using the standard protocol during bolus administration of intravenous contrast. Multiplanar reconstructed images including MIPs were obtained and reviewed to evaluate the vascular anatomy.  Contrast: OMNIPAQUE IOHEXOL 350 MG/ML SOLN  Comparison: 08/11/2009  Findings: Satisfactory opacification of the pulmonary arteries noted, and there is no evidence of pulmonary emboli.  No evidence of thoracic aortic aneurysm or dissection.  No evidence of mediastinal hematoma.  No mediastinal or  hilar masses are identified. No significant coronary artery calcification noted. Heart size is normal.  No evidence of pleural or pericardial effusion.  No evidence of pulmonary infiltrate or mass.  Central tracheobronchial airways are patent.  No adenopathy seen within the thorax.  IMPRESSION: Negative.  No evidence of pulmonary embolism or other active disease.  Original Report Authenticated By: Danae Orleans, M.D.   Chest Portable 1 View  04/06/2012  *RADIOLOGY REPORT*  Clinical Data: Shortness of breath, chest pain.  PORTABLE CHEST - 1 VIEW  Comparison: 08/11/2009 CT  Findings: Mild hypoaeration without focal consolidation, pleural  effusion, or pneumothorax.  Cardiomediastinal contours are within normal limits.  No acute osseous abnormality.  Multilevel degenerative changes.  IMPRESSION: No radiographic evidence of acute cardiopulmonary process.  Original Report Authenticated By: Waneta Martins, M.D.    Assessment/Plan Principal Problem:  *Chest pain Active Problems:  Palpitations  Obesity  Hypokalemia  Plan:  Patient will be admitted to the hospital under observation We will monitor her on telemetry Although her EKG appears non acute and her CT chest was negative for PE, her symptoms are concerning for angina.  We will cycle her cardiac markers, and request a cardiology consultation.  Will check 2D echo and start her on aspirin. Her potassium will be replaced and will check magnesium We will also check TSH since she is having palpitations Further disposition pending results.  Time Spent on Admission:  Lenya Sterne [facility] Pager: [phone] 04/06/2012, 8:42 AM

## 2012-04-06 NOTE — ED Notes (Signed)
Report given to Etta, RN unit 300. Ready to receive patient to room 322.

## 2012-04-06 NOTE — ED Notes (Signed)
PA from [facility] asked RN to give potassium per Dr Kerry Hough orders. Order released.

## 2012-04-06 NOTE — ED Provider Notes (Signed)
History     CSN: [id number]  Arrival date & time 04/06/12  0409   First MD Initiated Contact with Patient 04/06/12 0440      Chief Complaint  Patient presents with  . Chest Pain    (Consider location/radiation/quality/duration/timing/severity/associated sxs/prior treatment) HPI History provided by patient. We'll around 3 AM with left-sided chest pain radiates to her left arm. Squeezing in nature. Has associated shortness of breath. No history of similar symptoms. No diaphoresis. No nausea. She does feel anxious. No trauma. No rash. No leg pain or leg swelling. No history of DVT or PE.pain 8-9/10. No known aggravating or alleviating factors. History reviewed. No pertinent past medical history.  Past Surgical History  Procedure Date  . Abdominal hysterectomy     No family history on file.  History  Substance Use Topics  . Smoking status: Never Smoker   . Smokeless tobacco: Not on file  . Alcohol Use: No    OB History    Grav Para Term Preterm Abortions TAB SAB Ect Mult Living                  Review of Systems  Constitutional: Negative for fever and chills.  HENT: Negative for neck pain and neck stiffness.   Eyes: Negative for pain.  Respiratory: Positive for shortness of breath.   Cardiovascular: Positive for chest pain.  Gastrointestinal: Negative for abdominal pain.  Genitourinary: Negative for dysuria.  Musculoskeletal: Negative for back pain.  Skin: Negative for rash.  Neurological: Negative for headaches.  All other systems reviewed and are negative.    Allergies  Morphine and related and Penicillins  Home Medications   Current Outpatient Rx  Name Route Sig Dispense Refill  . ESTRADIOL 1 MG PO TABS Oral Take 1 mg by mouth daily.      BP 147/71  Pulse 82  Temp(Src) 98.8 F (37.1 C) (Oral)  Resp 19  Ht 5' (1.524 m)  Wt 228 lb (103.42 kg)  BMI 44.53 kg/m2  SpO2 99%  Physical Exam  Constitutional: She is oriented to person, place, and time.  She appears well-developed and well-nourished.  HENT:  Head: Normocephalic and atraumatic.  Eyes: Conjunctivae and EOM are normal. Pupils are equal, round, and reactive to light.  Neck: Trachea normal. Neck supple. No thyromegaly present.  Cardiovascular: Normal rate, regular rhythm, S1 normal, S2 normal and normal pulses.     No systolic murmur is present   No diastolic murmur is present  Pulses:      Radial pulses are 2+ on the right side, and 2+ on the left side.  Pulmonary/Chest: Effort normal and breath sounds normal. She has no wheezes. She has no rhonchi. She has no rales. She exhibits no tenderness.  Abdominal: Soft. Normal appearance and bowel sounds are normal. There is no tenderness. There is no CVA tenderness and negative Murphy's sign.  Musculoskeletal:       BLE:s Calves nontender, no cords or erythema, negative Homans sign  Neurological: She is alert and oriented to person, place, and time. She has normal strength. No cranial nerve deficit or sensory deficit. GCS eye subscore is 4. GCS verbal subscore is 5. GCS motor subscore is 6.  Skin: Skin is warm and dry. No rash noted. She is not diaphoretic.  Psychiatric: Her speech is normal.       Cooperative and appropriate    ED Course  Procedures (including critical care time)  Labs Reviewed  CBC - Abnormal; Notable for the following:  WBC 11.7 (*)    All other components within normal limits  BASIC METABOLIC PANEL - Abnormal; Notable for the following:    Potassium 2.9 (*)    Glucose, Bld 102 (*)    All other components within normal limits  PRO B NATRIURETIC PEPTIDE  TROPONIN I   Chest Portable 1 View  04/06/2012  *RADIOLOGY REPORT*  Clinical Data: Shortness of breath, chest pain.  PORTABLE CHEST - 1 VIEW  Comparison: 08/11/2009 CT  Findings: Mild hypoaeration without focal consolidation, pleural effusion, or pneumothorax.  Cardiomediastinal contours are within normal limits.  No acute osseous abnormality.   Multilevel degenerative changes.  IMPRESSION: No radiographic evidence of acute cardiopulmonary process.  Original Report Authenticated By: Waneta Martins, M.D.      Date: 04/06/2012  Rate: 83  Rhythm: normal sinus rhythm  QRS Axis: normal  Intervals: normal  ST/T Wave abnormalities: nonspecific ST changes  Conduction Disutrbances:none  Narrative Interpretation:   Old EKG Reviewed: none available  Stat EKG as above. Aspirin. Nitroglycerin. Pain improved and labs reviewed as above. Chest x-ray reviewed as above. Medicine consult in case discussed with Dr. Onalee Hua who agrees to admission.   MDM   Chest pain in a 51 year old female with character and quality of pain concerning for possible ACS. Initial troponin negative. Plan medical admission for further evaluation.        Sunnie Nielsen, MD 04/06/12 [phone]

## 2012-04-06 NOTE — ED Notes (Signed)
Pain in left arm last night, tonight with "squeezing pain"  To left chest that started approx 10 pta

## 2012-04-07 DIAGNOSIS — E876 Hypokalemia: Secondary | ICD-10-CM

## 2012-04-07 DIAGNOSIS — R079 Chest pain, unspecified: Secondary | ICD-10-CM

## 2012-04-07 LAB — CARDIAC PANEL(CRET KIN+CKTOT+MB+TROPI)
Relative Index: INVALID (ref 0.0–2.5)
Relative Index: INVALID (ref 0.0–2.5)
Troponin I: <0.3 ng/mL (ref <0.30)

## 2012-04-07 LAB — BASIC METABOLIC PANEL
BUN: 7 mg/dL (ref 6–23)
CO2: 27 mEq/L (ref 19–32)
Chloride: 109 mEq/L (ref 96–112)
Creatinine, Ser: 0.84 mg/dL (ref 0.50–1.10)
Potassium: 4.6 mEq/L (ref 3.5–5.1)

## 2012-04-07 LAB — CBC
HCT: 39.5 % (ref 36.0–46.0)
Hemoglobin: 13.2 g/dL (ref 12.0–15.0)
MCV: 87.8 fL (ref 78.0–100.0)
RBC: 4.5 MIL/uL (ref 3.87–5.11)
WBC: 7.6 10*3/uL (ref 4.0–10.5)

## 2012-04-07 LAB — GLUCOSE, CAPILLARY: Glucose-Capillary: 90 mg/dL (ref 70–99)

## 2012-04-07 NOTE — Progress Notes (Signed)
Pt d/c home. Discharge instructions given, pt verbalized understanding. Pt in stable condition upon discharge. Pt left via private vehicle.

## 2012-04-07 NOTE — Progress Notes (Signed)
PT REPORTED HAVING CHEST PRESSURE. SHE VERBALIZED IT AS IT IS NOT PAIN, BUT PRESSURE. MID CHEST, NOT RADIATING. DR. DAVID NOTIFIED, ORDER FOR ONE SET OF CARDIAC ENZIMES HAVE BEEN RECEIVED.

## 2012-04-07 NOTE — Discharge Summary (Signed)
Physician Discharge Summary  Patient ID: Kristine Miller MRN: [id number] DOB/AGE: 03/22/1961 51 y.o. Primary Care Physician:LUKING,SCOTT, MD, MD Admit date: 04/06/2012 Discharge date: 04/07/2012    Discharge Diagnoses:  1. Musculoskeletal chest pain, no evidence of myocardial ischemia or infarction. 2. Hypokalemia, resolved. 3. Anxiety. 4. Obesity. 5. Hypothyroidism, newly diagnosed.   Medication List  As of 04/07/2012 10:46 AM   TAKE these medications         estradiol 1 MG tablet   Commonly known as: ESTRACE   Take 1 mg by mouth daily.            Discharged Condition: Stable and improved.    Consults: None.  Significant Diagnostic Studies: Ct Angio Chest W/cm &/or Wo Cm  04/06/2012  *RADIOLOGY REPORT*  Clinical Data: Short of breath.  Chest pain.  Clinical suspicion for pulmonary embolism.  CT ANGIOGRAPHY CHEST  Technique:  Multidetector CT imaging of the chest using the standard protocol during bolus administration of intravenous contrast. Multiplanar reconstructed images including MIPs were obtained and reviewed to evaluate the vascular anatomy.  Contrast: OMNIPAQUE IOHEXOL 350 MG/ML SOLN  Comparison: 08/11/2009  Findings: Satisfactory opacification of the pulmonary arteries noted, and there is no evidence of pulmonary emboli.  No evidence of thoracic aortic aneurysm or dissection.  No evidence of mediastinal hematoma.  No mediastinal or hilar masses are identified. No significant coronary artery calcification noted. Heart size is normal.  No evidence of pleural or pericardial effusion.  No evidence of pulmonary infiltrate or mass.  Central tracheobronchial airways are patent.  No adenopathy seen within the thorax.  IMPRESSION: Negative.  No evidence of pulmonary embolism or other active disease.  Original Report Authenticated By: Danae Orleans, M.D.   Chest Portable 1 View  04/06/2012  *RADIOLOGY REPORT*  Clinical Data: Shortness of breath, chest pain.  PORTABLE CHEST  - 1 VIEW  Comparison: 08/11/2009 CT  Findings: Mild hypoaeration without focal consolidation, pleural effusion, or pneumothorax.  Cardiomediastinal contours are within normal limits.  No acute osseous abnormality.  Multilevel degenerative changes.  IMPRESSION: No radiographic evidence of acute cardiopulmonary process.  Original Report Authenticated By: Waneta Martins, M.D.    Lab Results: Basic Metabolic Panel:  Basename 04/07/12 0333 04/06/12 1152 04/06/12 0419  NA 142 -- 141  K 4.6 -- 2.9*  CL 109 -- 104  CO2 27 -- 24  GLUCOSE 93 -- 102*  BUN 7 -- 9  CREATININE 0.84 -- 0.76  CALCIUM 9.1 -- 9.4  MG -- 2.0 --  PHOS -- -- --       CBC:  Basename 04/07/12 0333 04/06/12 0419  WBC 7.6 11.7*  NEUTROABS -- --  HGB 13.2 13.6  HCT 39.5 40.3  MCV 87.8 87.6  PLT 269 287       Hospital Course: This 51 year old lady was admitted with symptoms of chest pain. The pain was described as a sharp substernal chest pain radiating to the left arm. She felt her heart pounding and felt tremulous. She was admitted and serial cardiac enzymes were negative. Echocardiogram did not show any wall motion abnormalities. She was hypokalemic which was repleted. Her TSH is elevated to 5.289. On closer questioning she does have symptoms of hypothyroidism. Cardiology saw the patient in the hospital and recommended an outpatient stress test.  Discharge Exam: Blood pressure 116/67, pulse 66, temperature 97.4 F (36.3 C), temperature source Oral, resp. rate 18, height 5' (1.524 m), weight 103.42 kg (228 lb), SpO2 98.00%. She looks systemically well.  Heart sounds are present and normal without murmurs. There is no gallop rhythm. Lung fields are clear. She is tender in the mid lower chest area, reproducing her pain. She is alert and orientated without any focal neurological signs.  Disposition: Home. She will contact Dr. Diona Browner to schedule an outpatient stress test. She will followup with her primary care  physician regarding the hypothyroidism.  Discharge Orders    Future Orders Please Complete By Expires   Diet - low sodium heart healthy      Increase activity slowly         Follow-up Information    Follow up with Nona Dell, MD. Call in 3 days.   Contact information:   [address] [place] [address] [phone]          Signed: Wilson Singer Pager [phone]  04/07/2012, 10:46 AM

## 2012-04-12 ENCOUNTER — Encounter: Payer: Self-pay | Admitting: Adult Health

## 2012-04-13 ENCOUNTER — Encounter: Payer: Self-pay | Admitting: Adult Health

## 2012-04-13 ENCOUNTER — Ambulatory Visit (INDEPENDENT_AMBULATORY_CARE_PROVIDER_SITE_OTHER): Payer: Self-pay | Admitting: Adult Health

## 2012-04-13 VITALS — BP 129/73 | HR 64 | Resp 18 | Ht 61.0 in | Wt 226.0 lb

## 2012-04-13 DIAGNOSIS — R079 Chest pain, unspecified: Secondary | ICD-10-CM

## 2012-04-13 DIAGNOSIS — R002 Palpitations: Secondary | ICD-10-CM

## 2012-04-13 NOTE — Assessment & Plan Note (Signed)
She has had two episodes of palpitations at rest since discharge. She ate a banana and drank water during those times, with resolution of symptoms but fatigue afterwards for the rest of the day. I have suggested a cardiac monitor to assess frequency and morphology of palpitations. We are waiting on clearance from the monitor company to do this as she has no insurance at this time. Will not draw more labs as these have been drawn per PCP yesterday.

## 2012-04-13 NOTE — Patient Instructions (Signed)
**Note De-identified  Obfuscation** Your physician recommends that you continue on your current medications as directed. Please refer to the Current Medication list given to you today. Your physician has recommended that you wear an event monitor. Event monitors are medical devices that record the heart's electrical activity. Doctors most often us these monitors to diagnose arrhythmias. Arrhythmias are problems with the speed or rhythm of the heartbeat. The monitor is a small, portable device. You can wear one while you do your normal daily activities. This is usually used to diagnose what is causing palpitations/syncope (passing out). Your physician recommends that you schedule a follow-up appointment in: 1 month. 

## 2012-04-13 NOTE — Assessment & Plan Note (Signed)
Will plan stress echo on next visit once all the labs have been completed and monitor tracings are evaluated if able to wear this. No new medications will be ordered at this time.

## 2012-04-13 NOTE — Progress Notes (Signed)
   HPI: Mrs. Looney is a pleasant 51 y/o obese patient of Dr. Dietrich Pates we are following s/p hospitalization after admission for chest pain, and palpitations. She was found to be hypokalemic. She admitted to frequent loose stools daily. Potassium was repleted but not prescribed on discharge, leaving follow up labs to PCP       She was ruled out for ACS. She was found to have high TSH per labs and is following up with her PCP for labs and treatment should this be necessary. She has had more occurences of rapid HR and palpitations at rest since discharge, usually going away after several minutes. These have occurred at rest. She has had no associated chest pain.  Allergies  Allergen Reactions  . Morphine And Related Swelling  . Penicillins Nausea And Vomiting    Current Outpatient Prescriptions  Medication Sig Dispense Refill  . estradiol (ESTRACE) 1 MG tablet Take 1 mg by mouth daily.        Past Medical History  Diagnosis Date  . Rotator cuff arthropathy, left   . IBS (irritable bowel syndrome)     Past Surgical History  Procedure Date  . Abdominal hysterectomy   . Rotator cuff repair   . Cholecystectomy   . Appendectomy    Echocardiogram: 04/03/2012 Left ventricle: The cavity size was normal. Wall thickness was normal. Systolic function was normal. The estimated ejection fraction was in the range of 55% to 60%. Although no diagnostic regional wall motion abnormality was identified, this possibility cannot be completely excluded on the basis of this study. Doppler parameters are consistent with abnormal left ventricular relaxation (grade 1 diastolic dysfunction). - Mitral valve: Trivial regurgitation. - Atrial septum: There was increased thickness of the septum, consistent with lipomatous hypertrophy. - Tricuspid valve: Mild regurgitation. - Pericardium, extracardiac: There was no pericardial effusion.   WUJ:WJXBJY of systems complete and found to be negative unless listed  above  PHYSICAL EXAM BP 129/73  Pulse 64  Resp 18  Ht 5\' 1"  (1.549 m)  Wt 226 lb (102.513 kg)  BMI 42.70 kg/m2  General: Well developed, well nourished, in no acute distress Obese.  Head: Eyes PERRLA, No xanthomas.   Normal cephalic and atramatic  Lungs: Clear bilaterally to auscultation and percussion. Heart: HRRR S1 S2, without MRG.  Pulses are 2+ & equal.            No carotid bruit. No JVD.  No abdominal bruits. No femoral bruits. Abdomen: Bowel sounds are positive, abdomen soft and non-tender without masses or                  Hernia's noted. Msk:  Back normal, normal gait. Normal strength and tone for age. Extremities: No clubbing, cyanosis or edema.  DP +1 Neuro: Alert and oriented X 3. Psych:  Good affect, responds appropriately  EKG:NSR with T-wave flattening in anterior/lateral leads.   ASSESSMENT AND PLAN

## 2012-05-14 ENCOUNTER — Ambulatory Visit: Payer: Self-pay | Admitting: Adult Health

## 2012-05-21 ENCOUNTER — Ambulatory Visit: Payer: Self-pay

## 2012-05-22 ENCOUNTER — Ambulatory Visit: Payer: Self-pay

## 2012-06-01 ENCOUNTER — Telehealth: Payer: Self-pay | Admitting: Adult Health

## 2012-06-01 NOTE — Telephone Encounter (Signed)
PT IS WEARING A HEART MONITOR, BUT THINKS IT HAS STOPPED WORKING. IT DOESN'T BEEP WHEN LEAD COMES OFF. SHE HAS CHANGED BATTERIES AND STILL THE SAME.

## 2012-06-01 NOTE — Telephone Encounter (Signed)
Advised patient that company would contact her if they werent getting correct readings

## 2012-06-25 ENCOUNTER — Other Ambulatory Visit: Payer: Self-pay | Admitting: Adult Health

## 2012-06-25 ENCOUNTER — Ambulatory Visit: Payer: Self-pay | Admitting: Adult Health

## 2012-06-25 ENCOUNTER — Ambulatory Visit (INDEPENDENT_AMBULATORY_CARE_PROVIDER_SITE_OTHER): Payer: Medicaid Other | Admitting: Adult Health

## 2012-06-25 ENCOUNTER — Encounter: Payer: Self-pay | Admitting: Adult Health

## 2012-06-25 VITALS — BP 120/88 | HR 82 | Wt 223.0 lb

## 2012-06-25 DIAGNOSIS — R002 Palpitations: Secondary | ICD-10-CM

## 2012-06-25 DIAGNOSIS — E876 Hypokalemia: Secondary | ICD-10-CM

## 2012-06-25 DIAGNOSIS — R079 Chest pain, unspecified: Secondary | ICD-10-CM

## 2012-06-25 NOTE — Progress Notes (Signed)
   HPI: Kristine Miller is a 51 year old obese patient of Dr. Dietrich Pates, we are following for complaints of palpitations after being seen in the hospital in May of 2013 for chest pain. At that time she had an echocardiogram is completed revealing normal LV function, she did have grade 1 diastolic dysfunction. On last visit she was evaluated by a cardiac monitor secondary to complaints of frequent palpitations. TSH was completed during hospitalization and found to be 5.289. She is following her primary care physician Dr. looking for continued evaluation and testing concerning thyroid levels. She continues to have complaints of palpitation and numbness and tingling in her left arm.  Allergies  Allergen Reactions  . Morphine And Related Swelling  . Penicillins Nausea And Vomiting    Current Outpatient Prescriptions  Medication Sig Dispense Refill  . estradiol (ESTRACE) 1 MG tablet Take 1 mg by mouth daily.        Past Medical History  Diagnosis Date  . Rotator cuff arthropathy, left   . IBS (irritable bowel syndrome)     Past Surgical History  Procedure Date  . Abdominal hysterectomy   . Rotator cuff repair   . Cholecystectomy   . Appendectomy     ZOX:WRUEAV of systems complete and found to be negative unless listed above  PHYSICAL EXAM BP 120/88  Pulse 82  Wt 223 lb (101.152 kg)  SpO2 95%  General: Well developed, well nourished, in no acute distress Head: Eyes PERRLA, No xanthomas.   Normal cephalic and atramatic  Lungs: Clear bilaterally to auscultation and percussion. Heart: HRRR S1 S2, without MRG.  Pulses are 2+ & equal.            No carotid bruit. No JVD.  No abdominal bruits. No femoral bruits. Abdomen: Bowel sounds are positive, abdomen soft and non-tender without masses or                  Hernia's noted. Msk:  Back normal, normal gait. Normal strength and tone for age. Extremities: No clubbing, cyanosis or edema.  DP +1 Neuro: Alert and oriented X 3. Psych:  Good  affect, responds appropriately  WUJ:WJXBJYNWG monitor: NSR without evidence of arrhythmia or pauses. NSR was seen with complaints of shortness of breath and heart racing. Rates averages in the 70's.  ASSESSMENT AND PLAN

## 2012-06-25 NOTE — Assessment & Plan Note (Signed)
She is requested a list of high potassium foods. She states she feels better when she is a take sweet potato or a banana when she is feeling her heart racing. Most recent labs in May show a potassium of 4.6. She is not on any medications that would deplete potassium. She is to follow with her primary care physician for ongoing labs and assessment.

## 2012-06-25 NOTE — Patient Instructions (Addendum)
Your physician recommends that you schedule a follow-up appointment in: as needed  High potassium diet - information enclosed

## 2012-06-25 NOTE — Assessment & Plan Note (Signed)
I have reviewed her [employer] monitor. I discussed with her the results. There is no indication for further cardiac testing at this time. Blood pressure is well-controlled. Cardiac monitor revealed only normal sinus rhythm without evidence of arrhythmia, WPW, or tachycardia despite patient's symptom diary stating her heart was racing or she was feeling palpitations. I have given her reassurance. We will see her on a when necessary basis.

## 2012-06-25 NOTE — Assessment & Plan Note (Signed)
She has no complaints of recurrent chest pain. She is having some left arm numbness and tingling. She has had rotator cuff repair and arthroscopy on the left shoulder. This may be related to the repair. She is to continue with primary care physician for further assessment in need to refer back to orthopedics should this be necessary.

## 2012-11-14 DIAGNOSIS — G35 Multiple sclerosis: Secondary | ICD-10-CM

## 2012-11-14 HISTORY — DX: Multiple sclerosis: G35

## 2013-02-17 ENCOUNTER — Emergency Department (HOSPITAL_COMMUNITY)
Admission: EM | Admit: 2013-02-17 | Discharge: 2013-02-17 | Disposition: A | Discharge status: Home / Self Care | Payer: Self-pay | Attending: Emergency Medicine | Admitting: Emergency Medicine

## 2013-02-17 ENCOUNTER — Emergency Department (HOSPITAL_COMMUNITY): Payer: Self-pay

## 2013-02-17 ENCOUNTER — Encounter (HOSPITAL_COMMUNITY): Payer: Self-pay | Admitting: Emergency Medicine

## 2013-02-17 DIAGNOSIS — R059 Cough, unspecified: Secondary | ICD-10-CM | POA: Insufficient documentation

## 2013-02-17 DIAGNOSIS — J029 Acute pharyngitis, unspecified: Secondary | ICD-10-CM | POA: Insufficient documentation

## 2013-02-17 DIAGNOSIS — R05 Cough: Secondary | ICD-10-CM | POA: Insufficient documentation

## 2013-02-17 DIAGNOSIS — Z8679 Personal history of other diseases of the circulatory system: Secondary | ICD-10-CM | POA: Insufficient documentation

## 2013-02-17 DIAGNOSIS — Z8719 Personal history of other diseases of the digestive system: Secondary | ICD-10-CM | POA: Insufficient documentation

## 2013-02-17 DIAGNOSIS — J069 Acute upper respiratory infection, unspecified: Secondary | ICD-10-CM | POA: Insufficient documentation

## 2013-02-17 DIAGNOSIS — Z8659 Personal history of other mental and behavioral disorders: Secondary | ICD-10-CM | POA: Insufficient documentation

## 2013-02-17 DIAGNOSIS — J3489 Other specified disorders of nose and nasal sinuses: Secondary | ICD-10-CM | POA: Insufficient documentation

## 2013-02-17 DIAGNOSIS — R093 Abnormal sputum: Secondary | ICD-10-CM | POA: Insufficient documentation

## 2013-02-17 DIAGNOSIS — Z8739 Personal history of other diseases of the musculoskeletal system and connective tissue: Secondary | ICD-10-CM | POA: Insufficient documentation

## 2013-02-17 DIAGNOSIS — J45901 Unspecified asthma with (acute) exacerbation: Secondary | ICD-10-CM | POA: Insufficient documentation

## 2013-02-17 DIAGNOSIS — Z8701 Personal history of pneumonia (recurrent): Secondary | ICD-10-CM | POA: Insufficient documentation

## 2013-02-17 HISTORY — DX: Anxiety disorder, unspecified: F41.9

## 2013-02-17 HISTORY — DX: Pneumonia, unspecified organism: J18.9

## 2013-02-17 HISTORY — DX: Unspecified asthma, uncomplicated: J45.909

## 2013-02-17 HISTORY — DX: Palpitations: R00.2

## 2013-02-17 MED ORDER — IPRATROPIUM BROMIDE 0.02 % IN SOLN
0.5000 mg | Freq: Once | RESPIRATORY_TRACT | Status: AC
  Administered 2013-02-17: 0.5 mg via RESPIRATORY_TRACT
  Filled 2013-02-17: qty 2.5

## 2013-02-17 MED ORDER — PREDNISONE 20 MG PO TABS: 40.0000 mg | ORAL_TABLET | Freq: Every day | ORAL | Status: DC

## 2013-02-17 MED ORDER — ALBUTEROL SULFATE (5 MG/ML) 0.5% IN NEBU
5.0000 mg | INHALATION_SOLUTION | Freq: Once | RESPIRATORY_TRACT | Status: AC
  Administered 2013-02-17: 5 mg via RESPIRATORY_TRACT
  Filled 2013-02-17: qty 1

## 2013-02-17 MED ORDER — PREDNISONE 50 MG PO TABS
60.0000 mg | ORAL_TABLET | Freq: Once | ORAL | Status: AC
  Administered 2013-02-17: 60 mg via ORAL
  Filled 2013-02-17: qty 1

## 2013-02-17 NOTE — ED Notes (Signed)
Patient with no complaints at this time. Respirations even and unlabored. Skin warm/dry. Discharge instructions reviewed with patient at this time. Patient given opportunity to voice concerns/ask questions. Patient discharged at this time and left Emergency Department with steady gait.   

## 2013-02-17 NOTE — ED Provider Notes (Signed)
History     CSN: [id number]  Arrival date & time 02/17/13  1420   First MD Initiated Contact with Patient 02/17/13 1534      Chief Complaint  Patient presents with  . Shortness of Breath     HPI Pt was seen at 1540.   Per pt, c/o gradual onset and worsening of persistent cough, wheezing and SOB for the past 3 days.  Describes her symptoms as "my asthma is acting up."  Has been using home MDI with transient relief.  States her "chest hurts when I cough." Describes the cough as productive of "yellow" sputum.  Has been associated with runny/stuffy nose, sinus congestion and sore throat.  Otherwise denies CP/palpitations, no back pain, no abd pain, no N/V/D, no fevers, no rash.     Past Medical History  Diagnosis Date  . Rotator cuff arthropathy, left   . IBS (irritable bowel syndrome)   . Pneumonia   . Asthma   . Palpitations   . Anxiety     Past Surgical History  Procedure Laterality Date  . Abdominal hysterectomy    . Rotator cuff repair    . Cholecystectomy    . Appendectomy    . Cesarean section      Family History  Problem Relation Age of Onset  . Cancer Mother   . Diabetes Mother     History  Substance Use Topics  . Smoking status: Never Smoker   . Smokeless tobacco: Never Used  . Alcohol Use: No    OB History   Grav Para Term Preterm Abortions TAB SAB Ect Mult Living   5 2 2  3  3   2       Review of Systems ROS: Statement: All systems negative except as marked or noted in the HPI; Constitutional: Negative for fever and chills. ; ; Eyes: Negative for eye pain, redness and discharge. ; ; ENMT: Negative for ear pain, hoarseness, +nasal congestion, sinus pressure and sore throat. ; ; Cardiovascular: Negative for palpitations, diaphoresis, and peripheral edema. ; ; Respiratory: +cough, wheezing, SOB. Negative for stridor. ; ; Gastrointestinal: Negative for nausea, vomiting, diarrhea, abdominal pain, blood in stool, hematemesis, jaundice and rectal bleeding. . ;  ; Genitourinary: Negative for dysuria, flank pain and hematuria. ; ; Musculoskeletal: Negative for back pain and neck pain. Negative for swelling and trauma.; ; Skin: Negative for pruritus, rash, abrasions, blisters, bruising and skin lesion.; ; Neuro: Negative for headache, lightheadedness and neck stiffness. Negative for weakness, altered level of consciousness , altered mental status, extremity weakness, paresthesias, involuntary movement, seizure and syncope.       Allergies  Morphine and related and Penicillins  Home Medications   Current Outpatient Rx  Name  Route  Sig  Dispense  Refill  . PE-DM-APAP & Doxylamin-DM-APAP (VICKS DAYQUIL/NYQUIL CLD & FLU) (LIQUID) MISC   Oral   Take 15 mLs by mouth at bedtime as needed (for cold symptoms).         . Phenir-PE-Sod Sal-Caff Cit (COUGH/COLD MEDICINE PO)   Oral   Take 2 capsules by mouth as needed (for cold symptoms).           BP 123/76  Pulse 93  Temp(Src) 98 F (36.7 C) (Oral)  Resp 16  Ht 5\' 1"  (1.549 m)  Wt 213 lb (96.616 kg)  BMI 40.27 kg/m2  SpO2 100%  Physical Exam 1545: Physical examination:  Nursing notes reviewed; Vital signs and O2 SAT reviewed;  Constitutional: Well developed, Well  nourished, Well hydrated, In no acute distress; Head:  Normocephalic, atraumatic; Eyes: EOMI, PERRL, No scleral icterus; ENMT: TM's clear bilat. +edemetous nasal turbinates bilat with clear rhinorrhea. Mouth and pharynx without lesions. No tonsillar exudates. No intra-oral edema. No submandibular or sublingual edema. No hoarse voice, no drooling, no stridor. No pain with manipulation of larynx. Mouth and pharynx normal, Mucous membranes moist; Neck: Supple, Full range of motion, No lymphadenopathy; Cardiovascular: Regular rate and rhythm, No murmur, rub, or gallop; Respiratory: Breath sounds diminished & equal bilaterally, scattered faint wheeze. No audible wheezing. Speaking full sentences with ease, Normal respiratory effort/excursion;  Chest: Nontender, Movement normal; Abdomen: Soft, Nontender, Nondistended, Normal bowel sounds; Genitourinary: No CVA tenderness; Extremities: Pulses normal, No tenderness, No edema, No calf edema or asymmetry.; Neuro: AA&Ox3, Major CN grossly intact.  Speech clear. No gross focal motor or sensory deficits in extremities.; Skin: Color normal, Warm, Dry.   ED Course  Procedures    MDM  MDM Reviewed: previous chart, nursing note and vitals Reviewed previous: ECG Interpretation: labs, ECG and x-ray    Date: 02/17/2013  Rate: 65  Rhythm: normal sinus rhythm  QRS Axis: normal  Intervals: normal  ST/T Wave abnormalities: normal  Conduction Disutrbances:none  Narrative Interpretation:   Old EKG Reviewed: unchanged; no significant changes from previous EKG dated 04/07/2012.   Date: 02/17/2013   Rate: 82  Rhythm: normal sinus rhythm  QRS Axis: normal  Intervals: normal  ST/T Wave abnormalities: nonspecific ST/T changes  Conduction Disutrbances:none  Narrative Interpretation:   Old EKG Reviewed: unchanged; no significant changes from previous EKG dated 04/13/2012.    Results for orders placed during the hospital encounter of 02/17/13  RAPID STREP SCREEN      Result Value Range   Streptococcus, Group A Screen (Direct) NEGATIVE  NEGATIVE   Dg Chest 2 View 02/17/2013  *RADIOLOGY REPORT*  Clinical Data:  Chest pain, cough and congestion.  CHEST - 2 VIEW  Comparison: 04/06/2012  Findings: The heart size and mediastinal contours are within normal limits.  Both lungs are clear.  The visualized skeletal structures are unremarkable.  IMPRESSION: No active disease.   Original Report Authenticated By: Irish Lack, M.D.      1800:  EKG repeated due to being initially unable to locate/access 1st EKG; both without acute abnl.  Has been eval previously by Cards with negative workup for palpitations, CP. Pt denies both today. Pt states she "feels better" after neb and steroid.  NAD, lungs CTA  bilat, no wheezing, resps easy, speaking full sentences, Sats 100% R/A. Wants to go home now.  States she has enough MDI at home.  Will rx prednisone for asthma exacerbation.  Doubt PE as cause for symptoms given low risk Well's. Dx and testing d/w pt and family.  Questions answered.  Verb understanding, agreeable to d/c home with outpt f/u.        Laray Anger, DO 02/19/13 1424

## 2013-02-17 NOTE — ED Notes (Addendum)
Patient c/o shortness of breath with cough since Thursday. Per patient cough occasional productive. Patient reports sputum as thick yellow/ green sputum. Patient c/o sore throat and chest pain. Patient states pain is mostly when she coughs.

## 2013-02-17 NOTE — ED Notes (Signed)
Patient does not need anything at this time. 

## 2013-02-17 NOTE — ED Notes (Signed)
RN at bedside

## 2013-06-22 ENCOUNTER — Other Ambulatory Visit: Payer: Self-pay | Admitting: *Deleted

## 2013-06-22 ENCOUNTER — Encounter: Payer: Self-pay | Admitting: *Deleted

## 2013-08-16 ENCOUNTER — Encounter (HOSPITAL_COMMUNITY): Payer: Self-pay | Admitting: *Deleted

## 2013-08-16 ENCOUNTER — Emergency Department (HOSPITAL_COMMUNITY): Payer: Medicaid Other

## 2013-08-16 ENCOUNTER — Emergency Department (HOSPITAL_COMMUNITY)
Admission: EM | Admit: 2013-08-16 | Discharge: 2013-08-16 | Disposition: A | Discharge status: Home / Self Care | Payer: Medicaid Other | Attending: Emergency Medicine | Admitting: Emergency Medicine

## 2013-08-16 DIAGNOSIS — M79609 Pain in unspecified limb: Secondary | ICD-10-CM | POA: Insufficient documentation

## 2013-08-16 DIAGNOSIS — R079 Chest pain, unspecified: Secondary | ICD-10-CM | POA: Insufficient documentation

## 2013-08-16 DIAGNOSIS — Z880 Allergy status to penicillin: Secondary | ICD-10-CM | POA: Insufficient documentation

## 2013-08-16 DIAGNOSIS — J45909 Unspecified asthma, uncomplicated: Secondary | ICD-10-CM | POA: Insufficient documentation

## 2013-08-16 DIAGNOSIS — Z8719 Personal history of other diseases of the digestive system: Secondary | ICD-10-CM | POA: Insufficient documentation

## 2013-08-16 DIAGNOSIS — Z8739 Personal history of other diseases of the musculoskeletal system and connective tissue: Secondary | ICD-10-CM | POA: Insufficient documentation

## 2013-08-16 DIAGNOSIS — Z8701 Personal history of pneumonia (recurrent): Secondary | ICD-10-CM | POA: Insufficient documentation

## 2013-08-16 DIAGNOSIS — Z8659 Personal history of other mental and behavioral disorders: Secondary | ICD-10-CM | POA: Insufficient documentation

## 2013-08-16 LAB — TROPONIN I: Troponin I: <0.3 ng/mL (ref <0.30)

## 2013-08-16 LAB — BASIC METABOLIC PANEL
BUN: 11 mg/dL (ref 6–23)
CO2: 28 mEq/L (ref 19–32)
Calcium: 10.4 mg/dL (ref 8.4–10.5)
Chloride: 102 mEq/L (ref 96–112)
Creatinine, Ser: 0.87 mg/dL (ref 0.50–1.10)
GFR calc Af Amer: 89 mL/min — ABNORMAL LOW (ref >90)
GFR calc non Af Amer: 76 mL/min — ABNORMAL LOW (ref >90)
Glucose, Bld: 107 mg/dL — ABNORMAL HIGH (ref 70–99)
Potassium: 4.2 mEq/L (ref 3.5–5.1)
Sodium: 140 mEq/L (ref 135–145)

## 2013-08-16 LAB — CBC WITH DIFFERENTIAL/PLATELET
Basophils Absolute: 0 10*3/uL (ref 0.0–0.1)
Basophils Relative: 0 % (ref 0–1)
Eosinophils Absolute: 0.1 10*3/uL (ref 0.0–0.7)
Eosinophils Relative: 1 % (ref 0–5)
HCT: 43.1 % (ref 36.0–46.0)
Hemoglobin: 14.8 g/dL (ref 12.0–15.0)
Lymphocytes Relative: 35 % (ref 12–46)
Lymphs Abs: 3.4 10*3/uL (ref 0.7–4.0)
MCH: 30.5 pg (ref 26.0–34.0)
MCHC: 34.3 g/dL (ref 30.0–36.0)
MCV: 88.9 fL (ref 78.0–100.0)
Monocytes Absolute: 0.6 10*3/uL (ref 0.1–1.0)
Monocytes Relative: 6 % (ref 3–12)
Neutro Abs: 5.7 10*3/uL (ref 1.7–7.7)
Neutrophils Relative %: 58 % (ref 43–77)
Platelets: 307 10*3/uL (ref 150–400)
RBC: 4.85 MIL/uL (ref 3.87–5.11)
RDW: 12.9 % (ref 11.5–15.5)
WBC: 9.9 10*3/uL (ref 4.0–10.5)

## 2013-08-16 MED ORDER — KETOROLAC TROMETHAMINE 30 MG/ML IJ SOLN
30.0000 mg | Freq: Once | INTRAMUSCULAR | Status: AC
  Administered 2013-08-16: 30 mg via INTRAVENOUS
  Filled 2013-08-16: qty 1

## 2013-08-16 MED ORDER — ONDANSETRON HCL 4 MG/2ML IJ SOLN
4.0000 mg | Freq: Once | INTRAMUSCULAR | Status: AC
  Administered 2013-08-16: 4 mg via INTRAVENOUS
  Filled 2013-08-16: qty 2

## 2013-08-16 MED ORDER — ASPIRIN 81 MG PO CHEW
324.0000 mg | CHEWABLE_TABLET | Freq: Once | ORAL | Status: DC
  Filled 2013-08-16: qty 4

## 2013-08-16 MED ORDER — MORPHINE SULFATE 4 MG/ML IJ SOLN
4.0000 mg | Freq: Once | INTRAMUSCULAR | Status: AC
  Administered 2013-08-16: 4 mg via INTRAVENOUS
  Filled 2013-08-16 (×2): qty 1

## 2013-08-16 NOTE — ED Notes (Signed)
Lt chest pain, with pain in lt arm, has felt "light headed".  Skin warm and dry,

## 2013-08-16 NOTE — ED Provider Notes (Signed)
CSN: [id number]     Arrival date & time 08/16/13  1836 History   First MD Initiated Contact with Patient 08/16/13 1852     Chief Complaint  Patient presents with  . Chest Pain   (Consider location/radiation/quality/duration/timing/severity/associated sxs/prior Treatment) HPI  52 year old female with left chest pain. Onset around 2:00 this morning while she was laying in bed. Pain has been pretty constant since onset. She also has some pain in her left upper extremity and that it feels "heavy." Her chest pain is worse with deep inspiration. She denies any shortness of breath. NO change in symptoms with exertion. No cough. No fevers or chills. She did have some cramping in her bilateral lower extremities last night. No known history of coronary artery disease. She does report history of similar pain which he reports was attributed to hypokalemia. Took two 325mg  ASA earlier today.   Past Medical History  Diagnosis Date  . Rotator cuff arthropathy, left   . IBS (irritable bowel syndrome)   . Pneumonia   . Asthma   . Palpitations   . Anxiety    Past Surgical History  Procedure Laterality Date  . Abdominal hysterectomy    . Rotator cuff repair    . Cholecystectomy    . Appendectomy    . Cesarean section     Family History  Problem Relation Age of Onset  . Cancer Mother   . Diabetes Mother    History  Substance Use Topics  . Smoking status: Never Smoker   . Smokeless tobacco: Never Used  . Alcohol Use: No   OB History   Grav Para Term Preterm Abortions TAB SAB Ect Mult Living   5 2 2  3  3   2      Review of Systems  All systems reviewed and negative, other than as noted in HPI.   Allergies  Morphine and related and Penicillins  Home Medications  No current outpatient prescriptions on file. BP 150/62  Pulse 70  Temp(Src) 98.1 F (36.7 C) (Oral)  Resp 20  Ht 5' (1.524 m)  Wt 208 lb (94.348 kg)  BMI 40.62 kg/m2  SpO2 100% Physical Exam  Nursing note and vitals  reviewed. Constitutional: She appears well-developed and well-nourished. No distress.  HENT:  Head: Normocephalic and atraumatic.  Eyes: Conjunctivae are normal. Right eye exhibits no discharge. Left eye exhibits no discharge.  Neck: Neck supple.  Cardiovascular: Normal rate, regular rhythm and normal heart sounds.  Exam reveals no gallop and no friction rub.   No murmur heard. Pulmonary/Chest: Effort normal and breath sounds normal. No respiratory distress. She exhibits no tenderness.  Abdominal: Soft. She exhibits no distension. There is no tenderness.  Musculoskeletal: She exhibits no edema and no tenderness.  Lower extremities symmetric as compared to each other. No calf tenderness. Negative Homan's. No palpable cords.   Neurological: She is alert. She exhibits normal muscle tone.  Skin: Skin is warm and dry. She is not diaphoretic.  Psychiatric: She has a normal mood and affect. Her behavior is normal. Thought content normal.    ED Course  Procedures (including critical care time) Labs Review Labs Reviewed  CBC WITH DIFFERENTIAL  BASIC METABOLIC PANEL  TROPONIN I   EKG:  Rhythm: normal sinus Vent. rate 65 BPM PR interval 160 ms QRS duration 102 ms QT/QTc 390/405 ms ST segments: NS ST changes Comparison: stable from 02/2013  Imaging Review No results found. Dg Chest 2 View  08/16/2013   CLINICAL DATA:  Chest pain  EXAM: CHEST  2 VIEW  COMPARISON:  02/17/2013  FINDINGS: The heart size and mediastinal contours are within normal limits. Both lungs are clear. The visualized skeletal structures are unremarkable.  IMPRESSION: No active cardiopulmonary disease.   Electronically Signed   By: Alcide Clever M.D.   On: 08/16/2013 19:40     MDM   1. Chest pain     51yF with CP. LUE symptoms somewhat concerning, but constant duration of symptoms atypical for ACS. No change with exertion. EKG very similar in comparison to April of this year. Consider pulmonary process with  pleuritic component. No dyspnea. No identifiable risk factors for PE. No tachycardic or hypoxic. Took ASA earlier. Will check CXR and labs. Declining pain medication.     Raeford Razor, MD 08/26/13 [phone]

## 2013-08-17 NOTE — ED Notes (Signed)
Patient reported not wanting to take morphine that is ordered. Patient states, "I don't know if I am allergic or not." Reports remembering incident where she received morphine and nurses had to remove IV and told her to not take it again. Dr. Juleen China notified. Stated to hold off on administering morphine.

## 2013-08-17 NOTE — ED Notes (Signed)
Patient requesting something else for pain. Dr. Juleen China notified and stated patient can receive 4 mg of morphine IV. Patient informed of this and patient agreed to take morphine.

## 2013-08-17 NOTE — ED Notes (Signed)
No symptoms of allergic reaction observed in patient. Patient denies shortness of breath, throat swelling, itching, or hives. No acute distress noted.

## 2013-08-18 ENCOUNTER — Emergency Department (HOSPITAL_COMMUNITY): Payer: Medicaid Other

## 2013-08-18 ENCOUNTER — Inpatient Hospital Stay (HOSPITAL_COMMUNITY)
Admission: EM | Admit: 2013-08-18 | Discharge: 2013-08-21 | DRG: 556 | Disposition: A | Discharge status: Home / Self Care | Payer: Medicaid Other | Attending: Internal Medicine | Admitting: Internal Medicine

## 2013-08-18 ENCOUNTER — Encounter (HOSPITAL_COMMUNITY): Payer: Self-pay

## 2013-08-18 DIAGNOSIS — E669 Obesity, unspecified: Secondary | ICD-10-CM | POA: Diagnosis present

## 2013-08-18 DIAGNOSIS — G35 Multiple sclerosis: Secondary | ICD-10-CM

## 2013-08-18 DIAGNOSIS — R9089 Other abnormal findings on diagnostic imaging of central nervous system: Secondary | ICD-10-CM

## 2013-08-18 DIAGNOSIS — D72829 Elevated white blood cell count, unspecified: Secondary | ICD-10-CM | POA: Diagnosis not present

## 2013-08-18 DIAGNOSIS — R290 Tetany: Secondary | ICD-10-CM | POA: Diagnosis present

## 2013-08-18 DIAGNOSIS — F411 Generalized anxiety disorder: Secondary | ICD-10-CM | POA: Diagnosis present

## 2013-08-18 DIAGNOSIS — R29898 Other symptoms and signs involving the musculoskeletal system: Principal | ICD-10-CM | POA: Diagnosis present

## 2013-08-18 DIAGNOSIS — R9082 White matter disease, unspecified: Secondary | ICD-10-CM | POA: Diagnosis present

## 2013-08-18 DIAGNOSIS — E876 Hypokalemia: Secondary | ICD-10-CM

## 2013-08-18 DIAGNOSIS — J45909 Unspecified asthma, uncomplicated: Secondary | ICD-10-CM | POA: Diagnosis present

## 2013-08-18 DIAGNOSIS — Z833 Family history of diabetes mellitus: Secondary | ICD-10-CM

## 2013-08-18 DIAGNOSIS — R079 Chest pain, unspecified: Secondary | ICD-10-CM

## 2013-08-18 DIAGNOSIS — Z9089 Acquired absence of other organs: Secondary | ICD-10-CM

## 2013-08-18 DIAGNOSIS — R002 Palpitations: Secondary | ICD-10-CM

## 2013-08-18 DIAGNOSIS — Z6841 Body Mass Index (BMI) 40.0 and over, adult: Secondary | ICD-10-CM

## 2013-08-18 DIAGNOSIS — M19019 Primary osteoarthritis, unspecified shoulder: Secondary | ICD-10-CM | POA: Diagnosis present

## 2013-08-18 DIAGNOSIS — F444 Conversion disorder with motor symptom or deficit: Secondary | ICD-10-CM

## 2013-08-18 DIAGNOSIS — K589 Irritable bowel syndrome without diarrhea: Secondary | ICD-10-CM | POA: Diagnosis present

## 2013-08-18 DIAGNOSIS — I639 Cerebral infarction, unspecified: Secondary | ICD-10-CM

## 2013-08-18 LAB — CBC WITH DIFFERENTIAL/PLATELET
Basophils Relative: 0 % (ref 0–1)
Eosinophils Absolute: 0.1 10*3/uL (ref 0.0–0.7)
HCT: 43.6 % (ref 36.0–46.0)
Hemoglobin: 15.1 g/dL — ABNORMAL HIGH (ref 12.0–15.0)
Lymphs Abs: 2.6 10*3/uL (ref 0.7–4.0)
MCH: 30.4 pg (ref 26.0–34.0)
MCHC: 34.6 g/dL (ref 30.0–36.0)
Monocytes Absolute: 0.7 10*3/uL (ref 0.1–1.0)
Monocytes Relative: 6 % (ref 3–12)
Neutrophils Relative %: 71 % (ref 43–77)
RBC: 4.97 MIL/uL (ref 3.87–5.11)

## 2013-08-18 LAB — BASIC METABOLIC PANEL
BUN: 12 mg/dL (ref 6–23)
CO2: 24 mEq/L (ref 19–32)
Chloride: 103 mEq/L (ref 96–112)
GFR calc non Af Amer: 81 mL/min — ABNORMAL LOW (ref >90)
Glucose, Bld: 102 mg/dL — ABNORMAL HIGH (ref 70–99)
Potassium: 3.6 mEq/L (ref 3.5–5.1)
Sodium: 142 mEq/L (ref 135–145)

## 2013-08-18 LAB — D-DIMER, QUANTITATIVE: D-Dimer, Quant: 0.74 ug/mL-FEU — ABNORMAL HIGH (ref 0.00–0.48)

## 2013-08-18 MED ORDER — KETOROLAC TROMETHAMINE 30 MG/ML IJ SOLN
30.0000 mg | Freq: Once | INTRAMUSCULAR | Status: AC
  Administered 2013-08-18: 30 mg via INTRAVENOUS
  Filled 2013-08-18: qty 1

## 2013-08-18 MED ORDER — IOHEXOL 350 MG/ML SOLN: 100.0000 mL | Freq: Once | INTRAVENOUS | Status: AC | PRN

## 2013-08-18 MED ORDER — ONDANSETRON HCL 4 MG/2ML IJ SOLN
4.0000 mg | Freq: Once | INTRAMUSCULAR | Status: AC
  Administered 2013-08-18: 4 mg via INTRAVENOUS
  Filled 2013-08-18: qty 2

## 2013-08-18 MED ORDER — LORAZEPAM 2 MG/ML IJ SOLN
1.0000 mg | Freq: Once | INTRAMUSCULAR | Status: AC
  Administered 2013-08-18: 1 mg via INTRAVENOUS
  Filled 2013-08-18: qty 1

## 2013-08-18 NOTE — ED Notes (Signed)
Patient placed on continuous cardiac monitoring, continuous pulse 0x monitoring 

## 2013-08-18 NOTE — ED Provider Notes (Signed)
CSN: [id number]     Arrival date & time 08/18/13  1929 History  This chart was scribed for Donnetta Hutching, MD by Carl Best, ED Scribe. This patient was seen in room APA19/APA19 and the patient's care was started at 9:12 PM.     Chief Complaint  Patient presents with  . Chest Pain    Patient is a 52 y.o. female presenting with chest pain. The history is provided by the patient. No language interpreter was used.  Chest Pain Associated symptoms: numbness (legs, hands, mouth upon arrival to the ED.) and shortness of breath    HPI Comments: Kristine Miller is a 52 y.o. Female brought in by her sister and daughter who presents to the Emergency Department complaining of worsening chest pain located centrally over the sternum that radiates to the back of her neck.  She describes the pain as intermittent and sharp at its worst.  The patient states that she came to the ED on Friday complaining of chest pain and was discharged after being given morphine to alleviate the pain.  The patient states that upon discharge the pain subsided and she slept all day Friday.  She states that 30 minutes after eating a sandwich she started shaking and "could not breathe".  She states that upon arrival to the ED today her legs, mouth, and arms went numb and the chest pain returned.  She lists shortness of breath as an associated symptom.  She states that movement aggravates the pain.  The patient denies experiencing these symptoms in the past.  The patient denies a personal history of health problems and tobacco use.   The patient does not work.  The patient states that her mother had a pacemaker placement this year.  The patient states that her BP was elevated when she was admitted to the ED on Friday but has otherwise never been high.  The patient does not know her cholesterol level.    The patient's PCP is Dr. Gerda Diss.      Past Medical History  Diagnosis Date  . Rotator cuff arthropathy, left   . IBS (irritable bowel  syndrome)   . Pneumonia   . Asthma   . Palpitations   . Anxiety    Past Surgical History  Procedure Laterality Date  . Abdominal hysterectomy    . Rotator cuff repair    . Cholecystectomy    . Appendectomy    . Cesarean section     Family History  Problem Relation Age of Onset  . Cancer Mother   . Diabetes Mother    History  Substance Use Topics  . Smoking status: Never Smoker   . Smokeless tobacco: Never Used  . Alcohol Use: No   OB History   Grav Para Term Preterm Abortions TAB SAB Ect Mult Living   5 2 2  3  3   2      Review of Systems  Respiratory: Positive for shortness of breath.   Cardiovascular: Positive for chest pain (located centrally over the sternum).  Neurological: Positive for numbness (legs, hands, mouth upon arrival to the ED.).  All other systems reviewed and are negative.    Allergies  Morphine and related and Penicillins  Home Medications   Current Outpatient Rx  Name  Route  Sig  Dispense  Refill  . ibuprofen (ADVIL,MOTRIN) 200 MG tablet   Oral   Take 200 mg by mouth every 6 (six) hours as needed for pain.  Triage Vitals: BP 149/88  Pulse 82  Temp(Src) 98.1 F (36.7 C) (Oral)  Resp 40  Ht 5' (1.524 m)  Wt 208 lb (94.348 kg)  BMI 40.62 kg/m2  SpO2 95%  Physical Exam  Nursing note and vitals reviewed. Constitutional: She is oriented to person, place, and time. She appears well-developed and well-nourished.  HENT:  Head: Normocephalic and atraumatic.  Eyes: Conjunctivae and EOM are normal. Pupils are equal, round, and reactive to light.  Neck: Normal range of motion. Neck supple.  Cardiovascular: Normal rate, regular rhythm and normal heart sounds.   Pulmonary/Chest: Effort normal and breath sounds normal.  Abdominal: Soft. Bowel sounds are normal.  Musculoskeletal: Normal range of motion.  Neurological: She is alert and oriented to person, place, and time.  Skin: Skin is warm and dry.  Psychiatric: She has a normal  mood and affect.    ED Course  Procedures (including critical care time)  DIAGNOSTIC STUDIES: Oxygen Saturation is 95% on room air, adequate by my interpretation.    COORDINATION OF CARE: 9:15 PM- Discussed administering blood work and medication to alleviate the chest pain.     Labs Review Labs Reviewed  BASIC METABOLIC PANEL - Abnormal; Notable for the following:    Glucose, Bld 102 (*)    GFR calc non Af Amer 81 (*)    All other components within normal limits  CBC WITH DIFFERENTIAL - Abnormal; Notable for the following:    WBC 11.6 (*)    Hemoglobin 15.1 (*)    Neutro Abs 8.2 (*)    All other components within normal limits  D-DIMER, QUANTITATIVE - Abnormal; Notable for the following:    D-Dimer, Quant 0.74 (*)    All other components within normal limits  TROPONIN I   Imaging Review Dg Chest Port 1 View  08/18/2013   CLINICAL DATA:  Chest pain  EXAM: PORTABLE CHEST - 1 VIEW  COMPARISON:  08/16/2013  FINDINGS: The heart size and mediastinal contours are within normal limits. Both lungs are clear. The visualized skeletal structures are unremarkable.  IMPRESSION: No active disease.   Electronically Signed   By: Alcide Clever M.D.   On: 08/18/2013 21:39    Date: 08/19/2013  Rate: 75  Rhythm: normal sinus rhythm  QRS Axis: normal  Intervals: normal  ST/T Wave abnormalities: normal  Conduction Disutrbances: none  Narrative Interpretation: unremarkable    MDM  No diagnosis found. Patient is low risk for acute coronary syndrome or pulmonary embolism.   However, d-dimer was slightly elevated at 0.74.    CT angiogram of chest pending.   Discussed with Dr. Deretha Emory  I personally performed the services described in this documentation, which was scribed in my presence. The recorded information has been reviewed and is accurate.    Donnetta Hutching, MD 08/19/13 [phone]

## 2013-08-18 NOTE — ED Notes (Signed)
Pt states after eating today, developed chest pain, numbness and tingling in hands and feet, pt is tearful, shaky, denies cough.

## 2013-08-18 NOTE — ED Notes (Signed)
Pt seen here Friday, states she was told she had pleurisy at that time and it is worse now.

## 2013-08-19 ENCOUNTER — Observation Stay (HOSPITAL_COMMUNITY)
Admit: 2013-08-19 | Discharge: 2013-08-19 | Disposition: A | Discharge status: Home / Self Care | Payer: Medicaid Other | Attending: Neurology | Admitting: Neurology

## 2013-08-19 ENCOUNTER — Encounter (HOSPITAL_COMMUNITY): Payer: Self-pay | Admitting: *Deleted

## 2013-08-19 ENCOUNTER — Emergency Department (HOSPITAL_COMMUNITY): Payer: Medicaid Other

## 2013-08-19 ENCOUNTER — Observation Stay (HOSPITAL_COMMUNITY): Payer: Medicaid Other

## 2013-08-19 DIAGNOSIS — F444 Conversion disorder with motor symptom or deficit: Secondary | ICD-10-CM | POA: Diagnosis present

## 2013-08-19 DIAGNOSIS — E669 Obesity, unspecified: Secondary | ICD-10-CM

## 2013-08-19 DIAGNOSIS — F411 Generalized anxiety disorder: Secondary | ICD-10-CM | POA: Diagnosis present

## 2013-08-19 DIAGNOSIS — R290 Tetany: Secondary | ICD-10-CM | POA: Diagnosis present

## 2013-08-19 DIAGNOSIS — D72829 Elevated white blood cell count, unspecified: Secondary | ICD-10-CM | POA: Diagnosis not present

## 2013-08-19 DIAGNOSIS — R29898 Other symptoms and signs involving the musculoskeletal system: Principal | ICD-10-CM

## 2013-08-19 LAB — MAGNESIUM: Magnesium: 1.8 mg/dL (ref 1.5–2.5)

## 2013-08-19 LAB — URINALYSIS, ROUTINE W REFLEX MICROSCOPIC
Glucose, UA: NEGATIVE mg/dL
Nitrite: NEGATIVE
Protein, ur: NEGATIVE mg/dL
Specific Gravity, Urine: <1.005 — ABNORMAL LOW (ref 1.005–1.030)
Urobilinogen, UA: 0.2 mg/dL (ref 0.0–1.0)

## 2013-08-19 LAB — URINE MICROSCOPIC-ADD ON

## 2013-08-19 LAB — VITAMIN B12: Vitamin B-12: 563 pg/mL (ref 211–911)

## 2013-08-19 LAB — HEMOGLOBIN A1C: Hgb A1c MFr Bld: 5.8 % — ABNORMAL HIGH (ref <5.7)

## 2013-08-19 LAB — HOMOCYSTEINE: Homocysteine: 6.3 umol/L (ref 4.0–15.4)

## 2013-08-19 MED ORDER — CALCIUM CARBONATE-VITAMIN D 500-200 MG-UNIT PO TABS
2.0000 | ORAL_TABLET | Freq: Three times a day (TID) | ORAL | Status: DC
  Administered 2013-08-19 – 2013-08-21 (×8): 2 via ORAL
  Filled 2013-08-19 (×8): qty 2

## 2013-08-19 MED ORDER — ASPIRIN EC 81 MG PO TBEC
81.0000 mg | DELAYED_RELEASE_TABLET | Freq: Every day | ORAL | Status: DC
  Administered 2013-08-19 – 2013-08-21 (×3): 81 mg via ORAL
  Filled 2013-08-19 (×3): qty 1

## 2013-08-19 MED ORDER — IOHEXOL 350 MG/ML SOLN
100.0000 mL | Freq: Once | INTRAVENOUS | Status: AC | PRN
  Administered 2013-08-19: 100 mL via INTRAVENOUS

## 2013-08-19 MED ORDER — POLYETHYLENE GLYCOL 3350 17 G PO PACK: 17.0000 g | PACK | Freq: Every day | ORAL | Status: DC | PRN

## 2013-08-19 MED ORDER — FLEET ENEMA 7-19 GM/118ML RE ENEM: 1.0000 | ENEMA | Freq: Once | RECTAL | Status: AC | PRN

## 2013-08-19 MED ORDER — GADOBENATE DIMEGLUMINE 529 MG/ML IV SOLN
20.0000 mL | Freq: Once | INTRAVENOUS | Status: AC | PRN
  Administered 2013-08-19: 20 mL via INTRAVENOUS

## 2013-08-19 MED ORDER — NAPROXEN 500 MG PO TABS: 500.0000 mg | ORAL_TABLET | Freq: Two times a day (BID) | ORAL | Status: DC

## 2013-08-19 MED ORDER — SODIUM CHLORIDE 0.9 % IJ SOLN
3.0000 mL | Freq: Two times a day (BID) | INTRAMUSCULAR | Status: DC
  Administered 2013-08-21: 3 mL via INTRAVENOUS

## 2013-08-19 MED ORDER — ACETAMINOPHEN 325 MG PO TABS
650.0000 mg | ORAL_TABLET | ORAL | Status: DC | PRN
  Administered 2013-08-19 – 2013-08-21 (×5): 650 mg via ORAL
  Filled 2013-08-19 (×5): qty 2

## 2013-08-19 MED ORDER — POTASSIUM CHLORIDE IN NACL 40-0.9 MEQ/L-% IV SOLN
INTRAVENOUS | Status: DC
  Administered 2013-08-19 – 2013-08-21 (×4): via INTRAVENOUS

## 2013-08-19 MED ORDER — CYCLOBENZAPRINE HCL 10 MG PO TABS: 10.0000 mg | ORAL_TABLET | Freq: Two times a day (BID) | ORAL | Status: DC | PRN

## 2013-08-19 MED ORDER — ONDANSETRON HCL 4 MG/2ML IJ SOLN: 4.0000 mg | INTRAMUSCULAR | Status: DC | PRN

## 2013-08-19 MED ORDER — CALCIUM CITRATE-VITAMIN D 500-400 MG-UNIT PO CHEW: 2.0000 | CHEWABLE_TABLET | Freq: Three times a day (TID) | ORAL | Status: DC

## 2013-08-19 MED ORDER — LORAZEPAM 2 MG/ML IJ SOLN
1.0000 mg | Freq: Once | INTRAMUSCULAR | Status: AC
  Administered 2013-08-19: 1 mg via INTRAVENOUS
  Filled 2013-08-19: qty 1

## 2013-08-19 MED ORDER — BISACODYL 10 MG RE SUPP: 10.0000 mg | Freq: Every day | RECTAL | Status: DC | PRN

## 2013-08-19 NOTE — ED Notes (Signed)
Pt states she has been having headache x 3 weeks

## 2013-08-19 NOTE — Evaluation (Signed)
Physical Therapy Evaluation Patient Details Name: Kristine Miller MRN: [id number] DOB: October 28, 1961 Today's Date: 08/19/2013 Time: 0940-1006 PT Time Calculation (min): 26 min  PT Assessment / Plan / Recommendation History of Present Illness   Pt is a 52 yo female who states she just lost the strength in her legs and could not walk.  Her husband brought her to the ER and she is being admitted for further testing.  Clinical Impression  Pt has global weakness of B LE of generally 3/5.  Pt is able to ambulate short distances with rolling walker.  Pt will need skilled therapy to return her to prior functional level.    PT Assessment  Patient needs continued PT services    Follow Up Recommendations   outpatient    Does the patient have the potential to tolerate intense rehabilitation    N/A  Barriers to Discharge  none      Equipment Recommendations    rolling walker   Recommendations for Other Services   none  Frequency Min 5X/week    Precautions / Restrictions Precautions Precautions: Fall Restrictions Weight Bearing Restrictions: No   Pertinent Vitals/Pain 0/10      Mobility  Bed Mobility Bed Mobility: Supine to Sit;Sit to Supine Supine to Sit: 6: Modified independent (Device/Increase time) Sit to Supine: 6: Modified independent (Device/Increase time) Transfers Transfers: Sit to Stand Sit to Stand: 5: Supervision Ambulation/Gait Ambulation/Gait Assistance: 6: Modified independent (Device/Increase time) Ambulation Distance (Feet): 25 Feet Assistive device: Rolling walker Gait Pattern: Decreased step length - right;Decreased step length - left Gait velocity: slow    Exercises General Exercises - Lower Extremity Long Arc Quad: AROM;Both;5 reps Hip ABduction/ADduction: AROM;Both;5 reps;Sidelying Straight Leg Raises: AROM;Both;5 reps   PT Diagnosis: Difficulty walking;Generalized weakness  PT Problem List: Decreased strength;Decreased activity tolerance PT Treatment  Interventions: Gait training;Stair training;Therapeutic activities;Therapeutic exercise;DME instruction     PT Goals(Current goals can be found in the care plan section) Acute Rehab PT Goals Patient Stated Goal: To have her strength back and be able to walk PT Goal Formulation: With patient/family Time For Goal Achievement: 08/21/13 Potential to Achieve Goals: Good  Visit Information  Last PT Received On: 08/19/13       Prior Functioning  Home Living Family/patient expects to be discharged to:: Private residence Living Arrangements: Spouse/significant other Available Help at Discharge: Family Type of Home: House Home Access: Stairs to enter Secretary/administrator of Steps: 4 Home Layout: One level Home Equipment: None Prior Function Level of Independence: Independent Communication Communication: No difficulties Dominant Hand: Right    Cognition  Cognition Arousal/Alertness: Awake/alert Overall Cognitive Status: Within Functional Limits for tasks assessed    Extremity/Trunk Assessment Lower Extremity Assessment Lower Extremity Assessment: Overall WFL for tasks assessed (general strength 3/5) Cervical / Trunk Assessment Cervical / Trunk Assessment: Normal   Balance    End of Session PT - End of Session Equipment Utilized During Treatment: Gait belt Activity Tolerance: Patient tolerated treatment well Patient left: in bed (Pt has not slept all night)  GP     Grady Lucci,CINDY 08/19/2013, 10:09 AM

## 2013-08-19 NOTE — Progress Notes (Signed)
Nutrition Brief Note  Patient identified on the Malnutrition Screening Tool (MST) Report  Wt Readings from Last 15 Encounters:  08/19/13 210 lb 3.2 oz (95.346 kg)  08/16/13 208 lb (94.348 kg)  02/17/13 213 lb (96.616 kg)  06/25/12 223 lb (101.152 kg)  04/13/12 226 lb (102.513 kg)  04/06/12 228 lb (103.42 kg)  02/23/12 230 lb (104.327 kg)   Weight loss 13#,6% noted over past 14 months which is not significant and is desirable given pt obese state.  Body mass index is 41.05 kg/(m^2). 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   Current diet order is Heart Healthy, patient is consuming approximately 50% of meals at this time. Labs and medications reviewed.   Royann Shivers MS,RD,LDN,CSG Office: [phone] Pager: [phone]

## 2013-08-19 NOTE — H&P (Signed)
[facility] History and Physical  Kristine Miller  WUJ:[id number]  DOB: 07/01/1961   DOA: 08/19/2013   PCP:   Lilyan Punt, MD   Chief Complaint:  Difficulty walking since yesterday  HPI: Kristine Miller is a 52 y.o. female.   Pleasant Obese middle-aged Caucasian lady who presents with her husband and they both give a history.  She reports being in good health and does not take medications; she has been diagnosed with anxiety but does not take medications for it; she considers herself strong because she has to deal with everybody else's problems; she is a stay-at-home mother.  She had sudden onset of tingling of the left upper extremity associated chest pains and difficulty breathing 2 days ago, Friday, and came to the emergency room to be evaluated for chest pain. No acute problems were noted and she was diagnosed as having possible pleuritic chest pain.  She felt very fatigued all of Friday evening and rested most of Saturday, but then Sunday she felt great and had a normal day until after eating a snack at about 3 PM, husband reports she developed shaking of her entire body and she reports also tingling of both upper extremities, chest pain, difficulty breathing, and then could not walk because her legs felt like jelly when she tried to stand. Despite this she says she was lying down she could not her legs but if she tried to stand they were collapsed like jelly.  Her husband helped her into the car and brought her to the emergency room; while waiting in the emergency room she had sudden onset of shortness of breath, sweating, clammy palms, violent shaking all over, and contractures of her hands and feet. Donn Pierini EDP tells me that the nurses report that she was hyperventilating] She reportedly complained of chest pain and had an extensive evaluation for that including a CT angiogram. After spending about 8 hours in the emergency room being evaluated, she was about to be discharged home when  it was noted that she couldn't walk after getting from the stretcher. She reports that this is the main reason why should come to the hospital, over the triage nurses and physicians had gotten the impression that she came for chest pain. The hospitalist service was called to admit for evaluation of gait abnormality.    She reports that recently she has been having headaches and tightness in the back of her neck. She denies fever or chills.  She denies having any stress problems; she has never been treated for anxiety although she has been diagnosed with it. She does not like taking medications of any kind especially medications with sedate. She is requesting that we not give her any more morphine which she got to the emergency room.     Rewiew of Systems:   All systems negative except as marked bold or noted in the HPI;  Constitutional:    malaise, fever and chills. ;  Eyes:   eye pain, redness and discharge. ;  ENMT:   ear pain, hoarseness, nasal congestion, sinus pressure and sore throat. ;  Cardiovascular:    chest pain, palpitations, diaphoresis, dyspnea and peripheral edema.  Respiratory:   cough, hemoptysis, wheezing and stridor. ;  Gastrointestinal:  nausea, vomiting, diarrhea, constipation, abdominal pain, melena, blood in stool, hematemesis, jaundice and rectal bleeding. unusual weight loss..   Genitourinary:    frequency, dysuria, incontinence,flank pain and hematuria; Musculoskeletal:   back pain and neck pain.  swelling and trauma.;  Skin: .  pruritus, rash, abrasions, bruising and skin lesion.; ulcerations Neuro:    headache, lightheadedness and neck stiffness.  weakness, altered level of consciousness, altered mental status, extremity weakness, burning feet, involuntary movement, seizure and syncope.  Psych:    anxiety, depression, insomnia, tearfulness, panic attacks, hallucinations, paranoia, suicidal or homicidal ideation    Past Medical History  Diagnosis Date  . Rotator  cuff arthropathy, left   . IBS (irritable bowel syndrome)   . Pneumonia   . Asthma   . Palpitations   . Anxiety     Past Surgical History  Procedure Laterality Date  . Abdominal hysterectomy    . Rotator cuff repair    . Cholecystectomy    . Appendectomy    . Cesarean section      Medications:  HOME MEDS: Prior to Admission medications   Medication Sig Start Date End Date Taking? Authorizing Provider  ibuprofen (ADVIL,MOTRIN) 200 MG tablet Take 200 mg by mouth every 6 (six) hours as needed for pain.   Yes Historical Provider, MD  cyclobenzaprine (FLEXERIL) 10 MG tablet Take 1 tablet (10 mg total) by mouth 2 (two) times daily as needed for muscle spasms. 08/19/13   Donnetta Hutching, MD  naproxen (NAPROSYN) 500 MG tablet Take 1 tablet (500 mg total) by mouth 2 (two) times daily. 08/19/13   Donnetta Hutching, MD     Allergies:  Allergies  Allergen Reactions  . Morphine And Related Swelling  . Penicillins Nausea And Vomiting    Social History:   reports that she has never smoked. She has never used smokeless tobacco. She reports that she does not drink alcohol or use illicit drugs.  Family History: Family History  Problem Relation Age of Onset  . Cancer Mother   . Diabetes Mother      Physical Exam: Filed Vitals:   08/18/13 2154 08/18/13 2304 08/19/13 0103 08/19/13 0200  BP: 134/67 141/68 116/61 127/69  Pulse: 68 77 76 72  Temp:      TempSrc:      Resp:  18  22  Height:      Weight:      SpO2: 96% 95% 93% 93%   Blood pressure 127/69, pulse 72, temperature 98.1 F (36.7 C), temperature source Oral, resp. rate 22, height 5' (1.524 m), weight 94.348 kg (208 lb), SpO2 93.00%. Body mass index is 40.62 kg/(m^2).   GEN:  Pleasant obese Caucasian lady lying bed in no acute distress; looks young for her age cooperative with exam PSYCH:  alert and oriented x4;  neither anxious nor depressed; affect is appropriate. HEENT: Mucous membranes pink and anicteric; PERRLA; EOM intact; no  cervical lymphadenopathy nor thyromegaly or carotid bruit; no JVD; Breasts:: Not examined CHEST WALL: No tenderness CHEST: Normal respiration, clear to auscultation bilaterally HEART: Regular rate and rhythm; no murmurs rubs or gallops BACK: No kyphosis no scoliosis; no CVA tenderness ABDOMEN: Obese, soft non-tender; no masses, no organomegaly, normal abdominal bowel sounds; no pannus; no intertriginous candida. Rectal Exam: Not done EXTREMITIES: No bone or joint deformity; no edema; no ulcerations. Genitalia: not examined PULSES: 2+ and symmetric SKIN: Normal hydration no rash or ulceration CNS: Cranial nerves 2-12 grossly intact no focal lateralizing neurologic deficit; she clearly uses suboptimal effort when power is being assessed in the lower extremities; she does not anchor of the contralateral heel in the bed when attempting straight leg raising, but surprisingly is able to exert significant downward pressure when asked to push her legs downward against my hand.  Labs on Admission:  Basic Metabolic Panel:  Recent Labs Lab 08/16/13 1917 08/18/13 2124  NA 140 142  K 4.2 3.6  CL 102 103  CO2 28 24  GLUCOSE 107* 102*  BUN 11 12  CREATININE 0.87 0.83  CALCIUM 10.4 10.0   Liver Function Tests: No results found for this basename: AST, ALT, ALKPHOS, BILITOT, PROT, ALBUMIN,  in the last 168 hours No results found for this basename: LIPASE, AMYLASE,  in the last 168 hours No results found for this basename: AMMONIA,  in the last 168 hours CBC:  Recent Labs Lab 08/16/13 1917 08/18/13 2124  WBC 9.9 11.6*  NEUTROABS 5.7 8.2*  HGB 14.8 15.1*  HCT 43.1 43.6  MCV 88.9 87.7  PLT 307 301   Cardiac Enzymes:  Recent Labs Lab 08/16/13 1917 08/18/13 2124  TROPONINI <0.30 <0.30   BNP: No components found with this basename: POCBNP,  D-dimer: No components found with this basename: D-DIMER,  CBG: No results found for this basename: GLUCAP,  in the last 168  hours  Radiological Exams on Admission: Ct Head Wo Contrast  08/19/2013   *RADIOLOGY REPORT*  Clinical Data: Numbness and weakness in both legs.  CT HEAD WITHOUT CONTRAST  Technique:  Contiguous axial images were obtained from the base of the skull through the vertex without contrast.  Comparison: None.  Findings: There is no evidence of acute infarction, mass lesion, or intra- or extra-axial hemorrhage on CT.  The posterior fossa, including the cerebellum, brainstem and fourth ventricle, is within normal limits.  The third and lateral ventricles, and basal ganglia are unremarkable in appearance.  The cerebral hemispheres are symmetric in appearance, with normal gray- white differentiation.  No mass effect or midline shift is seen.  There is no evidence of fracture; visualized osseous structures are unremarkable in appearance.  The visualized portions of the orbits are within normal limits.  The paranasal sinuses and mastoid air cells are well-aerated.  No significant soft tissue abnormalities are seen.  IMPRESSION: Unremarkable noncontrast CT of the head.   Original Report Authenticated By: Tonia Ghent, M.D.   Ct Angio Chest Pe W/cm &/or Wo Cm  08/19/2013   *RADIOLOGY REPORT*  Clinical Data: Chest pain, numbness and shortness of breath.  CT ANGIOGRAPHY CHEST  Technique:  Multidetector CT imaging of the chest using the standard protocol during bolus administration of intravenous contrast. Multiplanar reconstructed images including MIPs were obtained and reviewed to evaluate the vascular anatomy.  Contrast: OMNIPAQUE IOHEXOL 350 MG/ML SOLN  Comparison: Chest radiograph performed 11/18/2012, and CTA of the chest performed 04/06/2012  Findings: There is no evidence of pulmonary embolus.  The lungs are clear bilaterally.  There is no evidence of significant focal consolidation, pleural effusion or pneumothorax. No masses are identified; no abnormal focal contrast enhancement is seen.  The mediastinum is  unremarkable in appearance.  No mediastinal lymphadenopathy is seen.  No pericardial effusion identified.  The great vessels are grossly unremarkable in appearance.  No axillary lymphadenopathy is seen.  The visualized portions of the thyroid gland are unremarkable in appearance.  The visualized portions of the liver and spleen are unremarkable. The patient is status post cholecystectomy, with clips noted at the gallbladder fossa.  No acute osseous abnormalities are seen.  IMPRESSION: No evidence of pulmonary embolus; unremarkable CTA of the chest.   Original Report Authenticated By: Tonia Ghent, M.D.   Dg Chest Port 1 View  08/18/2013   CLINICAL DATA:  Chest pain  EXAM: PORTABLE  CHEST - 1 VIEW  COMPARISON:  08/16/2013  FINDINGS: The heart size and mediastinal contours are within normal limits. Both lungs are clear. The visualized skeletal structures are unremarkable.  IMPRESSION: No active disease.   Electronically Signed   By: Alcide Clever M.D.   On: 08/18/2013 21:39     Assessment/Plan  Active Problems:   Obesity   Anxiety state, unspecified   Carpopedal spasm   Psychogenic gait   PLAN: Did discuss with her that the cause of her problems are likely anxiety related; explained that hyperventilation changes body chemistry and the flexibility of nerves and muscles to function. She does accept this explanation but is surprised because she says she has no overt distress.  Discuss the possibility of getting MRI, but recommend a neurology evaluation to see if an MRI is necessary and which specific areas should be subjected to MRI. She mentions that she cannot do an MRI because she is claustrophobic.  Will give "potassium supplements while in hospital, and give IV fluids with potassium.   Other plans as per orders.  Code Status: Full Family Communication:  Plans discuss with patient and husband at bedside    Arriyah Madej Nocturnist [facility] Pager [phone]  08/19/2013,  5:10 AM

## 2013-08-19 NOTE — Progress Notes (Signed)
52 yo female presented to ED 3 days ago with CP and numbness/tingling of left upper extremity. Extensive work up unrevealing and pt discharged and presented again with similar symptoms. Second workup was unrevealing.   This presentation she has numbness of upper and lower extremities associate with weakness. In addition she reports intermittent headaches for last 3-4 weeks. These headaches are associated with nausea no vomiting, photophobia and phoniphobia and usually last several hours.   She was admitted for further work up of LE weakness. Neuro consult requested.   This am continues with complaints LE weakness but denies CP.   Exam: Gen: sitting up watching TV. NAD CV: RRR No MGR trace LE edema Resp: normal effort BS clear bilaterally no wheeze Abd: obese, soft +BS non-tender  A/P LE weakness: appreciate neuro assistance. Await MRI and EEG results. Request PT evaluation.   CP: resolved this am. CT angio neg for PE, chest xray without abnormality, trop neg. No events on tele. TSH, A1C results pending. Will check lipid panel as well. Continue asa at low dose. May benefit OP follow up.   Obesity: BMI 41.1. Nutritional consult  Leukocytosis: mild. Likely reactive. Pt is afebrile and non-toxic appearing will monitor.     Clydie Braun m. Black, NP   Patient seen and examined; awaiting MRI, EEG; agree with above plan of care;  Loranda Mastel N

## 2013-08-19 NOTE — ED Provider Notes (Signed)
Patient was turned over to me for discharge CT angios chest was negative for pulmonary embolus. That was negative the patient was put up for discharge discharge orders are written.by Dr. Adriana Simas. Patient unable to walk claims that since 5:30 in the evening that her right leg and weak and she couldn't stand properly according to triage none of this was passed on to time. However patient clinically may have had a CVA clinically suspicious that this may be psychosomatic the patient does have weakness in the right leg compared to the left we get up on her feet she is very wobbly. CT of the head was negative for any hemorrhage or evidence of acute infarct but patient will need admission and MRI. Patient will be admitted to option it. Discussed with hospitalist.   Results for orders placed during the hospital encounter of 08/18/13  BASIC METABOLIC PANEL      Result Value Range   Sodium 142  135 - 145 mEq/L   Potassium 3.6  3.5 - 5.1 mEq/L   Chloride 103  96 - 112 mEq/L   CO2 24  19 - 32 mEq/L   Glucose, Bld 102 (*) 70 - 99 mg/dL   BUN 12  6 - 23 mg/dL   Creatinine, Ser 4.09  0.50 - 1.10 mg/dL   Calcium 81.1  8.4 - 91.4 mg/dL   GFR calc non Af Amer 81 (*) >90 mL/min   GFR calc Af Amer >90  >90 mL/min  CBC WITH DIFFERENTIAL      Result Value Range   WBC 11.6 (*) 4.0 - 10.5 K/uL   RBC 4.97  3.87 - 5.11 MIL/uL   Hemoglobin 15.1 (*) 12.0 - 15.0 g/dL   HCT 78.2  95.6 - 21.3 %   MCV 87.7  78.0 - 100.0 fL   MCH 30.4  26.0 - 34.0 pg   MCHC 34.6  30.0 - 36.0 g/dL   RDW 08.6  57.8 - 46.9 %   Platelets 301  150 - 400 K/uL   Neutrophils Relative % 71  43 - 77 %   Neutro Abs 8.2 (*) 1.7 - 7.7 K/uL   Lymphocytes Relative 22  12 - 46 %   Lymphs Abs 2.6  0.7 - 4.0 K/uL   Monocytes Relative 6  3 - 12 %   Monocytes Absolute 0.7  0.1 - 1.0 K/uL   Eosinophils Relative 1  0 - 5 %   Eosinophils Absolute 0.1  0.0 - 0.7 K/uL   Basophils Relative 0  0 - 1 %   Basophils Absolute 0.0  0.0 - 0.1 K/uL  TROPONIN I       Result Value Range   Troponin I <0.30  <0.30 ng/mL  D-DIMER, QUANTITATIVE      Result Value Range   D-Dimer, Quant 0.74 (*) 0.00 - 0.48 ug/mL-FEU   Dg Chest 2 View  08/16/2013   CLINICAL DATA:  Chest pain  EXAM: CHEST  2 VIEW  COMPARISON:  02/17/2013  FINDINGS: The heart size and mediastinal contours are within normal limits. Both lungs are clear. The visualized skeletal structures are unremarkable.  IMPRESSION: No active cardiopulmonary disease.   Electronically Signed   By: Alcide Clever M.D.   On: 08/16/2013 19:40   Ct Head Wo Contrast  08/19/2013   *RADIOLOGY REPORT*  Clinical Data: Numbness and weakness in both legs.  CT HEAD WITHOUT CONTRAST  Technique:  Contiguous axial images were obtained from the base of the skull through the vertex  without contrast.  Comparison: None.  Findings: There is no evidence of acute infarction, mass lesion, or intra- or extra-axial hemorrhage on CT.  The posterior fossa, including the cerebellum, brainstem and fourth ventricle, is within normal limits.  The third and lateral ventricles, and basal ganglia are unremarkable in appearance.  The cerebral hemispheres are symmetric in appearance, with normal gray- white differentiation.  No mass effect or midline shift is seen.  There is no evidence of fracture; visualized osseous structures are unremarkable in appearance.  The visualized portions of the orbits are within normal limits.  The paranasal sinuses and mastoid air cells are well-aerated.  No significant soft tissue abnormalities are seen.  IMPRESSION: Unremarkable noncontrast CT of the head.   Original Report Authenticated By: Tonia Ghent, M.D.   Ct Angio Chest Pe W/cm &/or Wo Cm  08/19/2013   *RADIOLOGY REPORT*  Clinical Data: Chest pain, numbness and shortness of breath.  CT ANGIOGRAPHY CHEST  Technique:  Multidetector CT imaging of the chest using the standard protocol during bolus administration of intravenous contrast. Multiplanar reconstructed images  including MIPs were obtained and reviewed to evaluate the vascular anatomy.  Contrast: OMNIPAQUE IOHEXOL 350 MG/ML SOLN  Comparison: Chest radiograph performed 11/18/2012, and CTA of the chest performed 04/06/2012  Findings: There is no evidence of pulmonary embolus.  The lungs are clear bilaterally.  There is no evidence of significant focal consolidation, pleural effusion or pneumothorax. No masses are identified; no abnormal focal contrast enhancement is seen.  The mediastinum is unremarkable in appearance.  No mediastinal lymphadenopathy is seen.  No pericardial effusion identified.  The great vessels are grossly unremarkable in appearance.  No axillary lymphadenopathy is seen.  The visualized portions of the thyroid gland are unremarkable in appearance.  The visualized portions of the liver and spleen are unremarkable. The patient is status post cholecystectomy, with clips noted at the gallbladder fossa.  No acute osseous abnormalities are seen.  IMPRESSION: No evidence of pulmonary embolus; unremarkable CTA of the chest.   Original Report Authenticated By: Tonia Ghent, M.D.   Dg Chest Port 1 View  08/18/2013   CLINICAL DATA:  Chest pain  EXAM: PORTABLE CHEST - 1 VIEW  COMPARISON:  08/16/2013  FINDINGS: The heart size and mediastinal contours are within normal limits. Both lungs are clear. The visualized skeletal structures are unremarkable.  IMPRESSION: No active disease.   Electronically Signed   By: Alcide Clever M.D.   On: 08/18/2013 21:39    Results for orders placed during the hospital encounter of 08/18/13  BASIC METABOLIC PANEL      Result Value Range   Sodium 142  135 - 145 mEq/L   Potassium 3.6  3.5 - 5.1 mEq/L   Chloride 103  96 - 112 mEq/L   CO2 24  19 - 32 mEq/L   Glucose, Bld 102 (*) 70 - 99 mg/dL   BUN 12  6 - 23 mg/dL   Creatinine, Ser 4.09  0.50 - 1.10 mg/dL   Calcium 81.1  8.4 - 91.4 mg/dL   GFR calc non Af Amer 81 (*) >90 mL/min   GFR calc Af Amer >90  >90 mL/min   CBC WITH DIFFERENTIAL      Result Value Range   WBC 11.6 (*) 4.0 - 10.5 K/uL   RBC 4.97  3.87 - 5.11 MIL/uL   Hemoglobin 15.1 (*) 12.0 - 15.0 g/dL   HCT 78.2  95.6 - 21.3 %   MCV 87.7  78.0 -  100.0 fL   MCH 30.4  26.0 - 34.0 pg   MCHC 34.6  30.0 - 36.0 g/dL   RDW 16.1  09.6 - 04.5 %   Platelets 301  150 - 400 K/uL   Neutrophils Relative % 71  43 - 77 %   Neutro Abs 8.2 (*) 1.7 - 7.7 K/uL   Lymphocytes Relative 22  12 - 46 %   Lymphs Abs 2.6  0.7 - 4.0 K/uL   Monocytes Relative 6  3 - 12 %   Monocytes Absolute 0.7  0.1 - 1.0 K/uL   Eosinophils Relative 1  0 - 5 %   Eosinophils Absolute 0.1  0.0 - 0.7 K/uL   Basophils Relative 0  0 - 1 %   Basophils Absolute 0.0  0.0 - 0.1 K/uL  TROPONIN I      Result Value Range   Troponin I <0.30  <0.30 ng/mL  D-DIMER, QUANTITATIVE      Result Value Range   D-Dimer, Quant 0.74 (*) 0.00 - 0.48 ug/mL-FEU     Shelda Jakes, MD 08/19/13 [phone]

## 2013-08-19 NOTE — Care Management Note (Signed)
    Page 1 of 1   08/21/2013     2:42:41 PM   CARE MANAGEMENT NOTE 08/21/2013  Patient:  Kristine Miller, Kristine Miller   Account Number:  [id number]  Date Initiated:  08/19/2013  Documentation initiated by:  Sharrie Rothman  Subjective/Objective Assessment:   Pt admitted from home with CP and weakness. Pt lives with her husband and will return home at discharge. Pt is independent with ADL's.     Action/Plan:   Artist notified. PT recommended outpt PT and rolling walker. PT stated that she has a RW at home. Will continue to follow for medication assistance needs.   Anticipated DC Date:  08/20/2013   Anticipated DC Plan:  HOME/SELF CARE  In-house referral  Financial Counselor      DC Planning Services  CM consult      Choice offered to / List presented to:             Status of service:  Completed, signed off Medicare Important Message given?   (If response is "NO", the following Medicare IM given date fields will be blank) Date Medicare IM given:   Date Additional Medicare IM given:    Discharge Disposition:  HOME/SELF CARE  Per UR Regulation:    If discussed at Long Length of Stay Meetings, dates discussed:    Comments:  08/21/13 1440 Arlyss Queen, RN BSN CM Pt discharged home today. Outpt PT followup appt made and documented on AVS. Pt also made aware of PT appt. No other CM needs noted.  08/19/13 1200 Arlyss Queen, RN BSN CM

## 2013-08-19 NOTE — Progress Notes (Signed)
EEG Completed; Results Pending  

## 2013-08-19 NOTE — ED Notes (Addendum)
Pt up for discharge, pt states she does not feel like she can walk. Tech assisted to get pt out of bed, pt  has weakness bilaterally lower extremities,states"legs feels like jelly"

## 2013-08-19 NOTE — Consult Note (Signed)
[facility] Donny Heffern A. Gerilyn Pilgrim, MD     [email or web address]          Kristine Miller is an 52 y.o. female.   ASSESSMENT/PLAN: This patient has unexplained symptoms which are multiple. There is no clear clinical syndrome. Potential concerns include the following: Demyelinating processes such as multiple sclerosis, pseudotumor cerebri/idiopathic intracranial hypertension, infectious and inflammatory processes and psychosomatic causes. A brain MRI will be obtained. An EEG will also be done.     This is a 52 year old right-handed white female who presented to the hospital several days ago with numbness and tingling of the left upper extremity associated with chest pain and discomfort. The patient had an extensive evaluation at that time the workup was unrevealing. She was discharged with presented with similar symptoms again and apparently had another workup which was unrevealing. She presented overnight with again similar symptoms of chest pain. This time she had numbness of the upper and lower extremities associated with weakness. She particularly has weakness of the legs with difficulties ambulating. Additionally, she had flexor spasms and the shaking of the upper and lower extremities with the more recent spells. The patient does not report any unusual psychosocial stressors. She is present today with her husband. The husband tells me that she has had episodic headaches for the last 3-4 weeks. The headaches are located in the bifrontal region and the occipital region. She told me that she is typically not a headache person. The patient tends her headaches last several hours. It appears that the patient also has significant nausea with the headaches but no vomiting. The headaches are also associated with photophobia and phonophobia. The patient has had a workup again for her symptoms including more recently a head CT scan and CT edge exam of the chest. Both are essentially unremarkable. There  is no dysarthria, dysphasia, dizziness or visual obscurations. The review of systems is otherwise negative other than stated above.  GENERAL: This is a pleasant obese lady in no acute distress.  HEENT: Unremarkable.  ABDOMEN: soft  EXTREMITIES: No edema   BACK: Unremarkable  SKIN: Normal by inspection.    MENTAL STATUS: Alert and oriented. Speech, language and cognition are generally intact. Judgment and insight normal.   CRANIAL NERVES: Pupils are equal, round and reactive to light and accommodation; extra ocular movements are full, there is no significant nystagmus; visual fields are full; upper and lower facial muscles are normal in strength and symmetric, there is no flattening of the nasolabial folds; tongue is midline; uvula is midline; shoulder elevation is normal.  MOTOR: Normal tone, bulk and strength; no pronator drift.  COORDINATION: Left finger to nose is normal, right finger to nose is normal, No rest tremor; no intention tremor; no postural tremor; no bradykinesia.  REFLEXES: Deep tendon reflexes are symmetrical and normal. Plantar responses are flexor bilaterally.   SENSATION: Normal to light touch, temperature, and pinprick.         Past Medical History  Diagnosis Date  . Rotator cuff arthropathy, left   . IBS (irritable bowel syndrome)   . Pneumonia   . Asthma   . Palpitations   . Anxiety     Past Surgical History  Procedure Laterality Date  . Abdominal hysterectomy    . Rotator cuff repair    . Cholecystectomy    . Appendectomy    . Cesarean section      Family History  Problem Relation Age of Onset  . Cancer Mother   . Diabetes Mother  Social History:  reports that she has never smoked. She has never used smokeless tobacco. She reports that she does not drink alcohol or use illicit drugs.  Allergies:  Allergies  Allergen Reactions  . Morphine And Related Swelling  . Penicillins Nausea And Vomiting    Medications:  Prior to  Admission medications   Medication Sig Start Date End Date Taking? Authorizing Provider  ibuprofen (ADVIL,MOTRIN) 200 MG tablet Take 200 mg by mouth every 6 (six) hours as needed for pain.   Yes Historical Provider, MD  cyclobenzaprine (FLEXERIL) 10 MG tablet Take 1 tablet (10 mg total) by mouth 2 (two) times daily as needed for muscle spasms. 08/19/13   Donnetta Hutching, MD  naproxen (NAPROSYN) 500 MG tablet Take 1 tablet (500 mg total) by mouth 2 (two) times daily. 08/19/13   Donnetta Hutching, MD     Scheduled Meds: . aspirin EC  81 mg Oral Daily  . calcium-vitamin D  2 tablet Oral TID  . sodium chloride  3 mL Intravenous Q12H   Continuous Infusions: . 0.9 % NaCl with KCl 40 mEq / L 100 mL/hr at 08/19/13 0545   PRN Meds:.acetaminophen, bisacodyl, ondansetron (ZOFRAN) IV, polyethylene glycol, sodium phosphate   Blood pressure 112/73, pulse 64, temperature 97.9 F (36.6 C), temperature source Oral, resp. rate 20, height 5' (1.524 m), weight 95.346 kg (210 lb 3.2 oz), SpO2 92.00%.   Results for orders placed during the hospital encounter of 08/18/13 (from the past 48 hour(s))  BASIC METABOLIC PANEL     Status: Abnormal   Collection Time    08/18/13  9:24 PM      Result Value Range   Sodium 142  135 - 145 mEq/L   Potassium 3.6  3.5 - 5.1 mEq/L   Chloride 103  96 - 112 mEq/L   CO2 24  19 - 32 mEq/L   Glucose, Bld 102 (*) 70 - 99 mg/dL   BUN 12  6 - 23 mg/dL   Creatinine, Ser 1.61  0.50 - 1.10 mg/dL   Calcium 09.6  8.4 - 04.5 mg/dL   GFR calc non Af Amer 81 (*) >90 mL/min   GFR calc Af Amer >90  >90 mL/min   Comment: (NOTE)     The eGFR has been calculated using the CKD EPI equation.     This calculation has not been validated in all clinical situations.     eGFR's persistently <90 mL/min signify possible Chronic Kidney     Disease.  CBC WITH DIFFERENTIAL     Status: Abnormal   Collection Time    08/18/13  9:24 PM      Result Value Range   WBC 11.6 (*) 4.0 - 10.5 K/uL   RBC 4.97  3.87 -  5.11 MIL/uL   Hemoglobin 15.1 (*) 12.0 - 15.0 g/dL   HCT 40.9  81.1 - 91.4 %   MCV 87.7  78.0 - 100.0 fL   MCH 30.4  26.0 - 34.0 pg   MCHC 34.6  30.0 - 36.0 g/dL   RDW 78.2  95.6 - 21.3 %   Platelets 301  150 - 400 K/uL   Neutrophils Relative % 71  43 - 77 %   Neutro Abs 8.2 (*) 1.7 - 7.7 K/uL   Lymphocytes Relative 22  12 - 46 %   Lymphs Abs 2.6  0.7 - 4.0 K/uL   Monocytes Relative 6  3 - 12 %   Monocytes Absolute 0.7  0.1 - 1.0  K/uL   Eosinophils Relative 1  0 - 5 %   Eosinophils Absolute 0.1  0.0 - 0.7 K/uL   Basophils Relative 0  0 - 1 %   Basophils Absolute 0.0  0.0 - 0.1 K/uL  TROPONIN I     Status: None   Collection Time    08/18/13  9:24 PM      Result Value Range   Troponin I <0.30  <0.30 ng/mL   Comment:            Due to the release kinetics of cTnI,     a negative result within the first hours     of the onset of symptoms does not rule out     myocardial infarction with certainty.     If myocardial infarction is still suspected,     repeat the test at appropriate intervals.  D-DIMER, QUANTITATIVE     Status: Abnormal   Collection Time    08/18/13  9:24 PM      Result Value Range   D-Dimer, Quant 0.74 (*) 0.00 - 0.48 ug/mL-FEU   Comment:            AT THE INHOUSE ESTABLISHED CUTOFF     VALUE OF 0.48 ug/mL FEU,     THIS ASSAY HAS BEEN DOCUMENTED     IN THE LITERATURE TO HAVE     A SENSITIVITY AND NEGATIVE     PREDICTIVE VALUE OF AT LEAST     98 TO 99%.  THE TEST RESULT     SHOULD BE CORRELATED WITH     AN ASSESSMENT OF THE CLINICAL     PROBABILITY OF DVT / VTE.  MAGNESIUM     Status: None   Collection Time    08/19/13  5:39 AM      Result Value Range   Magnesium 1.8  1.5 - 2.5 mg/dL    Ct Head Wo Contrast  08/19/2013   *RADIOLOGY REPORT*  Clinical Data: Numbness and weakness in both legs.  CT HEAD WITHOUT CONTRAST  Technique:  Contiguous axial images were obtained from the base of the skull through the vertex without contrast.  Comparison: None.   Findings: There is no evidence of acute infarction, mass lesion, or intra- or extra-axial hemorrhage on CT.  The posterior fossa, including the cerebellum, brainstem and fourth ventricle, is within normal limits.  The third and lateral ventricles, and basal ganglia are unremarkable in appearance.  The cerebral hemispheres are symmetric in appearance, with normal gray- white differentiation.  No mass effect or midline shift is seen.  There is no evidence of fracture; visualized osseous structures are unremarkable in appearance.  The visualized portions of the orbits are within normal limits.  The paranasal sinuses and mastoid air cells are well-aerated.  No significant soft tissue abnormalities are seen.  IMPRESSION: Unremarkable noncontrast CT of the head.   Original Report Authenticated By: Tonia Ghent, M.D.   Ct Angio Chest Pe W/cm &/or Wo Cm  08/19/2013   *RADIOLOGY REPORT*  Clinical Data: Chest pain, numbness and shortness of breath.  CT ANGIOGRAPHY CHEST  Technique:  Multidetector CT imaging of the chest using the standard protocol during bolus administration of intravenous contrast. Multiplanar reconstructed images including MIPs were obtained and reviewed to evaluate the vascular anatomy.  Contrast: OMNIPAQUE IOHEXOL 350 MG/ML SOLN  Comparison: Chest radiograph performed 11/18/2012, and CTA of the chest performed 04/06/2012  Findings: There is no evidence of pulmonary embolus.  The lungs are clear  bilaterally.  There is no evidence of significant focal consolidation, pleural effusion or pneumothorax. No masses are identified; no abnormal focal contrast enhancement is seen.  The mediastinum is unremarkable in appearance.  No mediastinal lymphadenopathy is seen.  No pericardial effusion identified.  The great vessels are grossly unremarkable in appearance.  No axillary lymphadenopathy is seen.  The visualized portions of the thyroid gland are unremarkable in appearance.  The visualized portions of  the liver and spleen are unremarkable. The patient is status post cholecystectomy, with clips noted at the gallbladder fossa.  No acute osseous abnormalities are seen.  IMPRESSION: No evidence of pulmonary embolus; unremarkable CTA of the chest.   Original Report Authenticated By: Tonia Ghent, M.D.   Dg Chest Port 1 View  08/18/2013   CLINICAL DATA:  Chest pain  EXAM: PORTABLE CHEST - 1 VIEW  COMPARISON:  08/16/2013  FINDINGS: The heart size and mediastinal contours are within normal limits. Both lungs are clear. The visualized skeletal structures are unremarkable.  IMPRESSION: No active disease.   Electronically Signed   By: Alcide Clever M.D.   On: 08/18/2013 21:39        Taneil Lazarus A. Gerilyn Pilgrim, M.D.  Diplomate, Biomedical engineer of Psychiatry and Neurology ( Neurology). 08/19/2013, 8:22 AM

## 2013-08-19 NOTE — Progress Notes (Signed)
UR chart review completed.  

## 2013-08-19 NOTE — ED Notes (Signed)
Pt states she has intermittent sharp chest pains, states her leg are weak and hard to lift.

## 2013-08-20 ENCOUNTER — Observation Stay (HOSPITAL_COMMUNITY): Payer: Medicaid Other

## 2013-08-20 DIAGNOSIS — R9089 Other abnormal findings on diagnostic imaging of central nervous system: Secondary | ICD-10-CM | POA: Diagnosis present

## 2013-08-20 DIAGNOSIS — R9082 White matter disease, unspecified: Secondary | ICD-10-CM | POA: Diagnosis present

## 2013-08-20 DIAGNOSIS — R930 Abnormal findings on diagnostic imaging of skull and head, not elsewhere classified: Secondary | ICD-10-CM

## 2013-08-20 LAB — BASIC METABOLIC PANEL
BUN: 12 mg/dL (ref 6–23)
Chloride: 105 mEq/L (ref 96–112)
Creatinine, Ser: 0.9 mg/dL (ref 0.50–1.10)
GFR calc Af Amer: 85 mL/min — ABNORMAL LOW (ref >90)
GFR calc non Af Amer: 73 mL/min — ABNORMAL LOW (ref >90)
Glucose, Bld: 88 mg/dL (ref 70–99)

## 2013-08-20 LAB — CSF CELL COUNT WITH DIFFERENTIAL
RBC Count, CSF: 3 /mm3 — ABNORMAL HIGH
Tube #: 4
WBC, CSF: 0 /mm3 (ref 0–5)

## 2013-08-20 LAB — CBC
HCT: 39.8 % (ref 36.0–46.0)
MCH: 30.4 pg (ref 26.0–34.0)
MCHC: 34.2 g/dL (ref 30.0–36.0)
Platelets: 296 10*3/uL (ref 150–400)
RDW: 13.1 % (ref 11.5–15.5)

## 2013-08-20 MED ORDER — LORAZEPAM 2 MG/ML IJ SOLN
1.0000 mg | Freq: Once | INTRAMUSCULAR | Status: AC
  Administered 2013-08-20: 1 mg via INTRAVENOUS
  Filled 2013-08-20: qty 1

## 2013-08-20 NOTE — Procedures (Signed)
[facility] Wade Sigala A. Gerilyn Pilgrim, MD     [email or web address]         NAME:  SAJE, GALLOP                ACCOUNT NO.:  [id number]  MEDICAL RECORD NO.:  [id number]  LOCATION:  EE                           FACILITY:  [facility]  PHYSICIAN:  Rowin Bayron A. Gerilyn Pilgrim, M.D. DATE OF BIRTH:  Jan 29, 1961  DATE OF PROCEDURE: DATE OF DISCHARGE:  08/19/2013                             EEG INTERPRETATION   INDICATION:  A 52 year old lady who presents with spells of shaking involving the upper and lower extremities suspicious for possible seizures.  MEDICATIONS:  Acetaminophen, aspirin, Zofran, MiraLax.  ANALYSIS:  A 16-channel recording using standard October 2020 measurements is conducted for 21 minutes.  There is a well-formed posterior dominant rhythm at 10.5 hertz which attenuates with eye opening.  There is beta activity observed in the frontal areas.  Awake and drowsy activities are observed.  Photic stimulation and hyperventilation are carried out without abnormal changes in the background activity.  There is no focal or lateralized slowing.  There is no epileptiform activity observed.  IMPRESSION:  Normal recording of awake and drowsy states.     Tinley Rought A. Gerilyn Pilgrim, M.D.     KAD/MEDQ  D:  08/20/2013  T:  08/20/2013  Job:  [id number]

## 2013-08-20 NOTE — Progress Notes (Signed)
PT Cancellation Note  Patient Details Name: Kristine Miller MRN: [id number] DOB: 1960-12-14   Cancelled Treatment:    Reason Eval/Treat Not Completed: Patient at procedure or test/unavailable At MRI, then to have LP.  No PT will be done today.  Myrlene Broker L 08/20/2013, 12:56 PM

## 2013-08-20 NOTE — Progress Notes (Signed)
Patient ID: AYMAR WHITFILL, female   DOB: 1960-11-28, 52 y.o.   MRN: [id number]  [facility] NEUROLOGY Trev Boley A. Gerilyn Pilgrim, MD     [email or web address]          ALLONA GONDEK is an 52 y.o. female.   Assessment/Plan: 1. Extensive demyelinating lesions involving the white matter bilaterally more in the left side. The concern is that the patient could have multiple sclerosis. Consequently, a spinal tap will be obtained. Additionally, additional labs obtained. Furthermore, a cervical spine MRI will be carried out.  The patient reports that she felt fairly well today. However, on going to the bathroom the left leg gave out.    GENERAL: She is in no acute distress.  HEENT: Unremarkable.  ABDOMEN: soft  EXTREMITIES: No edema   SKIN: Normal by inspection.    MENTAL STATUS: Alert and oriented. Speech, language and cognition are generally intact. Judgment and insight normal.   CRANIAL NERVES: Pupils are equal, round and reactive to light and accommodation; extra ocular movements are full, there is no significant nystagmus; visual fields are full; upper and lower facial muscles are normal in strength and symmetric, there is no flattening of the nasolabial folds; tongue is midline; uvula is midline; shoulder elevation is normal.  MOTOR: Today she has weakness involving the left upper extremity 4+/5 and left lower external 4+/5. The bulk and tone are normal. The right side shows normal tone bulk and strength.  COORDINATION: Left finger to nose is normal, right finger to nose is normal, No rest tremor; no intention tremor; no postural tremor; no bradykinesia.  The patient's brain MRI is reviewed in person. There is nothing seen on diffusion imaging indicating acute infarcts. However, she does have significantly increased white matter lesions on FLAIR imaging. She has about 8 seen bilaterally especially involving the left frontal area. She has a particularly moderate large lesion left frontal area  which is perpendicular to the ventricle. There is also mild periventricular increased signal seen in the frontal and posterior horns of the lateral ventricle. Nothing is seen in the corpus callosum. The findings are concerning for multiple sclerosis.      Objective: Vital signs in last 24 hours: Temp:  [97.5 F (36.4 C)-98.5 F (36.9 C)] 97.5 F (36.4 C) (10/07 0501) Pulse Rate:  [63-71] 64 (10/07 0501) Resp:  [18-20] 20 (10/07 0501) BP: (107-126)/(67-80) 114/74 mmHg (10/07 0501) SpO2:  [91 %-97 %] 96 % (10/07 0501) Weight:  [101.152 kg (223 lb)] 101.152 kg (223 lb) (10/07 0501)  Intake/Output from previous day: 10/06 0701 - 10/07 0700 In: 480 [P.O.:480] Out: -  Intake/Output this shift:   Nutritional status: Cardiac   Lab Results: Results for orders placed during the hospital encounter of 08/18/13 (from the past 48 hour(s))  BASIC METABOLIC PANEL     Status: Abnormal   Collection Time    08/18/13  9:24 PM      Result Value Range   Sodium 142  135 - 145 mEq/L   Potassium 3.6  3.5 - 5.1 mEq/L   Chloride 103  96 - 112 mEq/L   CO2 24  19 - 32 mEq/L   Glucose, Bld 102 (*) 70 - 99 mg/dL   BUN 12  6 - 23 mg/dL   Creatinine, Ser 4.09  0.50 - 1.10 mg/dL   Calcium 81.1  8.4 - 91.4 mg/dL   GFR calc non Af Amer 81 (*) >90 mL/min   GFR calc Af Amer >90  >90 mL/min  Comment: (NOTE)     The eGFR has been calculated using the CKD EPI equation.     This calculation has not been validated in all clinical situations.     eGFR's persistently <90 mL/min signify possible Chronic Kidney     Disease.  CBC WITH DIFFERENTIAL     Status: Abnormal   Collection Time    08/18/13  9:24 PM      Result Value Range   WBC 11.6 (*) 4.0 - 10.5 K/uL   RBC 4.97  3.87 - 5.11 MIL/uL   Hemoglobin 15.1 (*) 12.0 - 15.0 g/dL   HCT 16.1  09.6 - 04.5 %   MCV 87.7  78.0 - 100.0 fL   MCH 30.4  26.0 - 34.0 pg   MCHC 34.6  30.0 - 36.0 g/dL   RDW 40.9  81.1 - 91.4 %   Platelets 301  150 - 400 K/uL    Neutrophils Relative % 71  43 - 77 %   Neutro Abs 8.2 (*) 1.7 - 7.7 K/uL   Lymphocytes Relative 22  12 - 46 %   Lymphs Abs 2.6  0.7 - 4.0 K/uL   Monocytes Relative 6  3 - 12 %   Monocytes Absolute 0.7  0.1 - 1.0 K/uL   Eosinophils Relative 1  0 - 5 %   Eosinophils Absolute 0.1  0.0 - 0.7 K/uL   Basophils Relative 0  0 - 1 %   Basophils Absolute 0.0  0.0 - 0.1 K/uL  TROPONIN I     Status: None   Collection Time    08/18/13  9:24 PM      Result Value Range   Troponin I <0.30  <0.30 ng/mL   Comment:            Due to the release kinetics of cTnI,     a negative result within the first hours     of the onset of symptoms does not rule out     myocardial infarction with certainty.     If myocardial infarction is still suspected,     repeat the test at appropriate intervals.  D-DIMER, QUANTITATIVE     Status: Abnormal   Collection Time    08/18/13  9:24 PM      Result Value Range   D-Dimer, Quant 0.74 (*) 0.00 - 0.48 ug/mL-FEU   Comment:            AT THE INHOUSE ESTABLISHED CUTOFF     VALUE OF 0.48 ug/mL FEU,     THIS ASSAY HAS BEEN DOCUMENTED     IN THE LITERATURE TO HAVE     A SENSITIVITY AND NEGATIVE     PREDICTIVE VALUE OF AT LEAST     98 TO 99%.  THE TEST RESULT     SHOULD BE CORRELATED WITH     AN ASSESSMENT OF THE CLINICAL     PROBABILITY OF DVT / VTE.  MAGNESIUM     Status: None   Collection Time    08/19/13  5:39 AM      Result Value Range   Magnesium 1.8  1.5 - 2.5 mg/dL  TSH     Status: None   Collection Time    08/19/13  5:39 AM      Result Value Range   TSH 3.154  0.350 - 4.500 uIU/mL   Comment: Performed at [facility]  HEMOGLOBIN A1C     Status: Abnormal   Collection Time  08/19/13  5:39 AM      Result Value Range   Hemoglobin A1C 5.8 (*) <5.7 %   Comment: (NOTE)                                                                               According to the ADA Clinical Practice Recommendations for 2011, when     HbA1c is used as a screening  test:      >=6.5%   Diagnostic of Diabetes Mellitus               (if abnormal result is confirmed)     5.7-6.4%   Increased risk of developing Diabetes Mellitus     References:Diagnosis and Classification of Diabetes Mellitus,Diabetes     Care,2011,34(Suppl 1):S62-S69 and Standards of Medical Care in             Diabetes - 2011,Diabetes Care,2011,34 (Suppl 1):S11-S61.   Mean Plasma Glucose 120 (*) <117 mg/dL   Comment: Performed at [facility]  VITAMIN B12     Status: None   Collection Time    08/19/13 11:17 AM      Result Value Range   Vitamin B-12 563  211 - 911 pg/mL   Comment: Performed at [facility]  HOMOCYSTEINE     Status: None   Collection Time    08/19/13 11:17 AM      Result Value Range   Homocysteine 6.3  4.0 - 15.4 umol/L   Comment: Performed at [facility], ROUTINE W REFLEX MICROSCOPIC     Status: Abnormal   Collection Time    08/19/13  2:28 PM      Result Value Range   Color, Urine YELLOW  YELLOW   APPearance CLEAR  CLEAR   Specific Gravity, Urine <1.005 (*) 1.005 - 1.030   pH 5.5  5.0 - 8.0   Glucose, UA NEGATIVE  NEGATIVE mg/dL   Hgb urine dipstick TRACE (*) NEGATIVE   Bilirubin Urine NEGATIVE  NEGATIVE   Ketones, ur NEGATIVE  NEGATIVE mg/dL   Protein, ur NEGATIVE  NEGATIVE mg/dL   Urobilinogen, UA 0.2  0.0 - 1.0 mg/dL   Nitrite NEGATIVE  NEGATIVE   Leukocytes, UA NEGATIVE  NEGATIVE  URINE MICROSCOPIC-ADD ON     Status: None   Collection Time    08/19/13  2:28 PM      Result Value Range   Squamous Epithelial / LPF RARE  RARE   WBC, UA 0-2  <3 WBC/hpf   RBC / HPF 0-2  <3 RBC/hpf   Bacteria, UA RARE  RARE  BASIC METABOLIC PANEL     Status: Abnormal   Collection Time    08/20/13  5:08 AM      Result Value Range   Sodium 141  135 - 145 mEq/L   Potassium 4.3  3.5 - 5.1 mEq/L   Chloride 105  96 - 112 mEq/L   CO2 26  19 - 32 mEq/L   Glucose, Bld 88  70 - 99 mg/dL   BUN 12  6 - 23 mg/dL   Creatinine, Ser 1.61   0.50 - 1.10 mg/dL   Calcium 09.6  8.4 -  10.5 mg/dL   GFR calc non Af Amer 73 (*) >90 mL/min   GFR calc Af Amer 85 (*) >90 mL/min   Comment: (NOTE)     The eGFR has been calculated using the CKD EPI equation.     This calculation has not been validated in all clinical situations.     eGFR's persistently <90 mL/min signify possible Chronic Kidney     Disease.  CBC     Status: None   Collection Time    08/20/13  5:08 AM      Result Value Range   WBC 9.2  4.0 - 10.5 K/uL   RBC 4.47  3.87 - 5.11 MIL/uL   Hemoglobin 13.6  12.0 - 15.0 g/dL   HCT 40.9  81.1 - 91.4 %   MCV 89.0  78.0 - 100.0 fL   MCH 30.4  26.0 - 34.0 pg   MCHC 34.2  30.0 - 36.0 g/dL   RDW 78.2  95.6 - 21.3 %   Platelets 296  150 - 400 K/uL    Lipid Panel No results found for this basename: CHOL, TRIG, HDL, CHOLHDL, VLDL, LDLCALC,  in the last 72 hours  Studies/Results: Ct Head Wo Contrast  08/19/2013   *RADIOLOGY REPORT*  Clinical Data: Numbness and weakness in both legs.  CT HEAD WITHOUT CONTRAST  Technique:  Contiguous axial images were obtained from the base of the skull through the vertex without contrast.  Comparison: None.  Findings: There is no evidence of acute infarction, mass lesion, or intra- or extra-axial hemorrhage on CT.  The posterior fossa, including the cerebellum, brainstem and fourth ventricle, is within normal limits.  The third and lateral ventricles, and basal ganglia are unremarkable in appearance.  The cerebral hemispheres are symmetric in appearance, with normal gray- white differentiation.  No mass effect or midline shift is seen.  There is no evidence of fracture; visualized osseous structures are unremarkable in appearance.  The visualized portions of the orbits are within normal limits.  The paranasal sinuses and mastoid air cells are well-aerated.  No significant soft tissue abnormalities are seen.  IMPRESSION: Unremarkable noncontrast CT of the head.   Original Report Authenticated By: Tonia Ghent, M.D.   Ct Angio Chest Pe W/cm &/or Wo Cm  08/19/2013   *RADIOLOGY REPORT*  Clinical Data: Chest pain, numbness and shortness of breath.  CT ANGIOGRAPHY CHEST  Technique:  Multidetector CT imaging of the chest using the standard protocol during bolus administration of intravenous contrast. Multiplanar reconstructed images including MIPs were obtained and reviewed to evaluate the vascular anatomy.  Contrast: OMNIPAQUE IOHEXOL 350 MG/ML SOLN  Comparison: Chest radiograph performed 11/18/2012, and CTA of the chest performed 04/06/2012  Findings: There is no evidence of pulmonary embolus.  The lungs are clear bilaterally.  There is no evidence of significant focal consolidation, pleural effusion or pneumothorax. No masses are identified; no abnormal focal contrast enhancement is seen.  The mediastinum is unremarkable in appearance.  No mediastinal lymphadenopathy is seen.  No pericardial effusion identified.  The great vessels are grossly unremarkable in appearance.  No axillary lymphadenopathy is seen.  The visualized portions of the thyroid gland are unremarkable in appearance.  The visualized portions of the liver and spleen are unremarkable. The patient is status post cholecystectomy, with clips noted at the gallbladder fossa.  No acute osseous abnormalities are seen.  IMPRESSION: No evidence of pulmonary embolus; unremarkable CTA of the chest.   Original Report Authenticated By: Tonia Ghent, M.D.  Mr Laqueta Jean Wo Contrast  08/19/2013   CLINICAL DATA:  Headaches and bilateral leg weakness.  EXAM: MRI HEAD WITHOUT AND WITH CONTRAST  TECHNIQUE: Multiplanar, multiecho pulse sequences of the brain and surrounding structures were obtained according to standard protocol without and with intravenous contrast  CONTRAST:  20mL MULTIHANCE GADOBENATE DIMEGLUMINE 529 MG/ML IV SOLN  COMPARISON:  08/19/2013 head CT. No comparison brain MR.  FINDINGS: No acute infarct.  No intracranial hemorrhage.  Scattered  nonspecific white matter changes most notable left frontal operculum region. In a patient of this age and gender, demyelinating process needs to be considered. Other considerations include that secondary to ; small vessel disease, vasculitis, inflammatory process or migraine headaches.  None of these areas demonstrate enhancement or restricted motion as may be seen with areas of active demyelination.  No intracranial mass or abnormal enhancement separate from above described findings.  No hydrocephalus.  Major intracranial vascular structures are patent. Right vertebral artery is small in caliber.  Nonspecific 7 mm subcutaneous lesion right frontal region.  IMPRESSION: Nonspecific white matter type changes as detailed above.   Electronically Signed   By: Bridgett Larsson M.D.   On: 08/19/2013 14:12   Dg Chest Port 1 View  08/18/2013   CLINICAL DATA:  Chest pain  EXAM: PORTABLE CHEST - 1 VIEW  COMPARISON:  08/16/2013  FINDINGS: The heart size and mediastinal contours are within normal limits. Both lungs are clear. The visualized skeletal structures are unremarkable.  IMPRESSION: No active disease.   Electronically Signed   By: Alcide Clever M.D.   On: 08/18/2013 21:39    Medications:  Scheduled Meds: . aspirin EC  81 mg Oral Daily  . calcium-vitamin D  2 tablet Oral TID  . sodium chloride  3 mL Intravenous Q12H   Continuous Infusions: . 0.9 % NaCl with KCl 40 mEq / L 100 mL/hr at 08/20/13 0515   PRN Meds:.acetaminophen, bisacodyl, ondansetron (ZOFRAN) IV, polyethylene glycol     LOS: 2 days   Sarath Privott A. Gerilyn Pilgrim, M.D.  Diplomate, Biomedical engineer of Psychiatry and Neurology ( Neurology).

## 2013-08-20 NOTE — Progress Notes (Signed)
UR chart review completed.  

## 2013-08-20 NOTE — Progress Notes (Addendum)
[facility] PROGRESS NOTE  JAKIA KENNEBREW YNW:[id number] DOB: 1960-12-12 DOA: 08/18/2013 PCP: Lilyan Punt, MD  Assessment/Plan: Abnormal brain MRI. Scattered nonspecific white matter changes most notable left frontal operculum region. In a patient of this age and gender, demyelinating process needs to be considered.Nonspecific 7 mm subcutaneous lesion right frontal region. Appreciate neuro assistance. LP requested per Dr. Kathline Magic. EEG within normal limits.   LE weakness: may be related to above.  appreciate neuro assistance. LP for today. PT recommending OP PT. Continue with walker.   CP: resolved. CT angio neg for PE, chest xray without abnormality, trop neg. No events on tele. TSH within normal limits, A1C  5.8. Continue asa at low dose. May benefit OP follow up.   Obesity: BMI 41.1. Nutritional consult   Leukocytosis: mild. Likely reactive. Resolved.  Pt remains afebrile and non-toxic appearing will monitor.    Code Status: full Family Communication: husband at bedside Disposition Plan: home when ready   Consultants:  Neuro Dr. Kathline Magic  Procedures:  LP 08/30/13  Antibiotics:  none  HPI/Subjective: Ambulating in room with walker. Gait steady.   Objective: Filed Vitals:   08/20/13 0501  BP: 114/74  Pulse: 64  Temp: 97.5 F (36.4 C)  Resp: 20    Intake/Output Summary (Last 24 hours) at 08/20/13 1019 Last data filed at 08/20/13 0900  Gross per 24 hour  Intake    600 ml  Output      0 ml  Net    600 ml   Filed Weights   08/18/13 1935 08/19/13 0405 08/20/13 0501  Weight: 94.348 kg (208 lb) 95.346 kg (210 lb 3.2 oz) 101.152 kg (223 lb)    Exam:   General:  Obese NAD  Cardiovascular: RRR No LE edema PPP  Respiratory: normal effort BS clear bilaterally no wheeze no rhonchi  Abdomen: obese soft +BS non-tender to palpation  Musculoskeletal: no joint swelling/erythema   Data Reviewed: Basic Metabolic Panel:  Recent Labs Lab 08/16/13 1917  08/18/13 2124 08/19/13 0539 08/20/13 0508  NA 140 142  --  141  K 4.2 3.6  --  4.3  CL 102 103  --  105  CO2 28 24  --  26  GLUCOSE 107* 102*  --  88  BUN 11 12  --  12  CREATININE 0.87 0.83  --  0.90  CALCIUM 10.4 10.0  --  10.4  MG  --   --  1.8  --    Liver Function Tests: No results found for this basename: AST, ALT, ALKPHOS, BILITOT, PROT, ALBUMIN,  in the last 168 hours No results found for this basename: LIPASE, AMYLASE,  in the last 168 hours No results found for this basename: AMMONIA,  in the last 168 hours CBC:  Recent Labs Lab 08/16/13 1917 08/18/13 2124 08/20/13 0508  WBC 9.9 11.6* 9.2  NEUTROABS 5.7 8.2*  --   HGB 14.8 15.1* 13.6  HCT 43.1 43.6 39.8  MCV 88.9 87.7 89.0  PLT 307 301 296   Cardiac Enzymes:  Recent Labs Lab 08/16/13 1917 08/18/13 2124  TROPONINI <0.30 <0.30   BNP (last 3 results) No results found for this basename: PROBNP,  in the last 8760 hours CBG: No results found for this basename: GLUCAP,  in the last 168 hours  No results found for this or any previous visit (from the past 240 hour(s)).   Studies: Ct Head Wo Contrast  08/19/2013   *RADIOLOGY REPORT*  Clinical Data: Numbness and weakness in both  legs.  CT HEAD WITHOUT CONTRAST  Technique:  Contiguous axial images were obtained from the base of the skull through the vertex without contrast.  Comparison: None.  Findings: There is no evidence of acute infarction, mass lesion, or intra- or extra-axial hemorrhage on CT.  The posterior fossa, including the cerebellum, brainstem and fourth ventricle, is within normal limits.  The third and lateral ventricles, and basal ganglia are unremarkable in appearance.  The cerebral hemispheres are symmetric in appearance, with normal gray- white differentiation.  No mass effect or midline shift is seen.  There is no evidence of fracture; visualized osseous structures are unremarkable in appearance.  The visualized portions of the orbits are within  normal limits.  The paranasal sinuses and mastoid air cells are well-aerated.  No significant soft tissue abnormalities are seen.  IMPRESSION: Unremarkable noncontrast CT of the head.   Original Report Authenticated By: Tonia Ghent, M.D.   Ct Angio Chest Pe W/cm &/or Wo Cm  08/19/2013   *RADIOLOGY REPORT*  Clinical Data: Chest pain, numbness and shortness of breath.  CT ANGIOGRAPHY CHEST  Technique:  Multidetector CT imaging of the chest using the standard protocol during bolus administration of intravenous contrast. Multiplanar reconstructed images including MIPs were obtained and reviewed to evaluate the vascular anatomy.  Contrast: OMNIPAQUE IOHEXOL 350 MG/ML SOLN  Comparison: Chest radiograph performed 11/18/2012, and CTA of the chest performed 04/06/2012  Findings: There is no evidence of pulmonary embolus.  The lungs are clear bilaterally.  There is no evidence of significant focal consolidation, pleural effusion or pneumothorax. No masses are identified; no abnormal focal contrast enhancement is seen.  The mediastinum is unremarkable in appearance.  No mediastinal lymphadenopathy is seen.  No pericardial effusion identified.  The great vessels are grossly unremarkable in appearance.  No axillary lymphadenopathy is seen.  The visualized portions of the thyroid gland are unremarkable in appearance.  The visualized portions of the liver and spleen are unremarkable. The patient is status post cholecystectomy, with clips noted at the gallbladder fossa.  No acute osseous abnormalities are seen.  IMPRESSION: No evidence of pulmonary embolus; unremarkable CTA of the chest.   Original Report Authenticated By: Tonia Ghent, M.D.   Mr Laqueta Jean Wo Contrast  08/19/2013   CLINICAL DATA:  Headaches and bilateral leg weakness.  EXAM: MRI HEAD WITHOUT AND WITH CONTRAST  TECHNIQUE: Multiplanar, multiecho pulse sequences of the brain and surrounding structures were obtained according to standard protocol without  and with intravenous contrast  CONTRAST:  20mL MULTIHANCE GADOBENATE DIMEGLUMINE 529 MG/ML IV SOLN  COMPARISON:  08/19/2013 head CT. No comparison brain MR.  FINDINGS: No acute infarct.  No intracranial hemorrhage.  Scattered nonspecific white matter changes most notable left frontal operculum region. In a patient of this age and gender, demyelinating process needs to be considered. Other considerations include that secondary to ; small vessel disease, vasculitis, inflammatory process or migraine headaches.  None of these areas demonstrate enhancement or restricted motion as may be seen with areas of active demyelination.  No intracranial mass or abnormal enhancement separate from above described findings.  No hydrocephalus.  Major intracranial vascular structures are patent. Right vertebral artery is small in caliber.  Nonspecific 7 mm subcutaneous lesion right frontal region.  IMPRESSION: Nonspecific white matter type changes as detailed above.   Electronically Signed   By: Bridgett Larsson M.D.   On: 08/19/2013 14:12   Dg Chest Port 1 View  08/18/2013   CLINICAL DATA:  Chest pain  EXAM: PORTABLE CHEST - 1 VIEW  COMPARISON:  08/16/2013  FINDINGS: The heart size and mediastinal contours are within normal limits. Both lungs are clear. The visualized skeletal structures are unremarkable.  IMPRESSION: No active disease.   Electronically Signed   By: Alcide Clever M.D.   On: 08/18/2013 21:39    Scheduled Meds: . aspirin EC  81 mg Oral Daily  . calcium-vitamin D  2 tablet Oral TID  . sodium chloride  3 mL Intravenous Q12H   Continuous Infusions: . 0.9 % NaCl with KCl 40 mEq / L 100 mL/hr at 08/20/13 7829    Active Problems:   Obesity   Anxiety state, unspecified   Carpopedal spasm   Psychogenic gait   Leukocytosis, unspecified   Abnormal brain MRI    Time spent: 30 minutes    [facility]  [facility] Pager [phone]. If 7PM-7AM, please contact night-coverage at [email or web address], password  [facility] 08/20/2013, 10:19 AM  LOS: 2 days    Patient seen and examined; no new symptoms; ? MS; awaiting LP, and MR c spine  scheduled for today ; appreciate neurology input  Yasmin Dibello N

## 2013-08-20 NOTE — Procedures (Signed)
Preprocedure Dx: Left side weakness, abnormal MRI question multiple sclerosis Postprocedure Dx: Left side weakness, abnormal MRI question multiple sclerosis Procedure:  Fluoroscopically guided lumbar puncture Radiologist:  Tyron Russell Anesthesia:  2 ml of 1% lidocaine Specimen:  9 ml CSF, clear colorless EBL:   None Opening pressure: 12  cm H2O (prone) Complications: None

## 2013-08-21 DIAGNOSIS — G35 Multiple sclerosis: Secondary | ICD-10-CM

## 2013-08-21 LAB — CRYPTOCOCCAL ANTIGEN, CSF: Crypto Ag: NEGATIVE

## 2013-08-21 MED ORDER — CYCLOBENZAPRINE HCL 10 MG PO TABS: 10.0000 mg | ORAL_TABLET | Freq: Two times a day (BID) | ORAL | Status: DC | PRN

## 2013-08-21 MED ORDER — CALCIUM CARBONATE-VITAMIN D 500-200 MG-UNIT PO TABS: 2.0000 | ORAL_TABLET | Freq: Three times a day (TID) | ORAL | Status: DC

## 2013-08-21 NOTE — Progress Notes (Signed)
Patient received discharge instructions along with follow up appointments and prescriptions. Patient verbalized understanding of all instructions. Patient was escorted by staff via wheelchair to vehicle. Patient discharged to home in stable condition. 

## 2013-08-21 NOTE — Discharge Summary (Addendum)
Physician Discharge Summary  Kristine Miller ZOX:[id number] DOB: 1961-10-28 DOA: 08/18/2013  PCP: Lilyan Punt, MD  Admit date: 08/18/2013 Discharge date: 08/21/2013  Time spent: 40 minutes  Recommendations for Outpatient Follow-up:  1. Follow up with PCP 2 weeks for evaluation of symptoms 2. Dr. Gerilyn Pilgrim 09/10/13. Discharge Diagnoses:  Active Problems:   Obesity   Anxiety state, unspecified   Carpopedal spasm   Psychogenic gait   Leukocytosis, unspecified   Abnormal brain MRI   Discharge Condition: stable  Diet recommendation: heart healthy  Filed Weights   08/19/13 0405 08/20/13 0501 08/21/13 0649  Weight: 95.346 kg (210 lb 3.2 oz) 101.152 kg (223 lb) 97.886 kg (215 lb 12.8 oz)    History of present illness:  Kristine Miller is a 52 y.o..lady who presented on 08/19/13 to the ED with her husband and they both give a history.She reported being in good health and does not take medications; she had been diagnosed with anxiety but does not take medications for it; she considered herself strong because she has to deal with everybody else's problems; she is a stay-at-home mother.  She had sudden onset of tingling of the left upper extremity associated chest pains and difficulty breathing 2 days prior, and came to the emergency room to be evaluated for chest pain. No acute problems were noted and she was diagnosed as having possible pleuritic chest pain.  She felt very fatigued all of that evening and rested most of the next day. But then the following day she felt great and had a normal day until after eating a snack at about 3 PM, husband reported she developed shaking of her entire body and she reported also tingling of both upper extremities, chest pain, difficulty breathing, and then could not walk because her legs felt like jelly when she tried to stand. Despite this she said she could not her legs but if she tried to stand they were collapsed like jelly.  Her husband helped her into the  car and brought her to the emergency room; while waiting in the emergency room she had sudden onset of shortness of breath, sweating, clammy palms, violent shaking all over, and contractures of her hands and feet. Kristine Miller EDP reported that the nurses report that she was hyperventilating]  She reportedly complained of chest pain and had an extensive evaluation for that including a CT angiogram. After spending about 8 hours in the emergency room being evaluated, she was about to be discharged home when it was noted that she couldn't walk after getting from the stretcher. She reported that this is the main reason why should come to the hospital, over the triage nurses and physicians had gotten the impression that she came for chest pain. The hospitalist service was called to admit for evaluation of gait abnormality.  She reported that recently she has been having headaches and tightness in the back of her neck.  She denied fever or chills.  She denied having any stress problems; she has never been treated for anxiety although she has been diagnosed with it. She does not like taking medications of any kind especially medications with sedate. She is requesting that we not give her any more morphine which she got to the emergency room.      Hospital Course:  Abnormal brain MRI. Pt admitted for LE weakness. MRI yielded scattered nonspecific white matter changes most notable left frontal operculum region. Nonspecific 7 mm subcutaneous lesion right frontal region. Evaluated by neuro who opined concern for  multiple sclerosis.  LP done results unremarkable. EEG within normal limits. Dr. Gerilyn Pilgrim will see 09/10/13.   LE weakness: Related to above. PT recommending OP PT. Have arranged via case management. Patient has rolling walker   CP: resolved. CT angio neg for PE, chest xray without abnormality, trop neg. No events on tele. TSH within normal limits, A1C 5.8.    Obesity: BMI 41.1. Nutritional consult    Leukocytosis: mild. Likely reactive. Resolved. Pt remains afebrile and non-toxic appearing will monitor.      Procedures:  EEG  LP  Consultations:  Neurology Dr. Gerilyn Pilgrim  Discharge Exam: Filed Vitals:   08/21/13 1049  BP: 116/73  Pulse: 62  Temp: 97.9 F (36.6 C)  Resp: 20    General: well nourished NAD Cardiovascular: RRR No MGR No LE edema Respiratory: normal effort BS clear bilaterally no wheeze no rhonchi  Discharge Instructions     Medication List         calcium-vitamin D 500-200 MG-UNIT per tablet  Commonly known as:  OSCAL WITH D  Take 2 tablets by mouth 3 (three) times daily.     cyclobenzaprine 10 MG tablet  Commonly known as:  FLEXERIL  Take 1 tablet (10 mg total) by mouth 2 (two) times daily as needed for muscle spasms.     ibuprofen 200 MG tablet  Commonly known as:  ADVIL,MOTRIN  Take 200 mg by mouth every 6 (six) hours as needed for pain.       Allergies  Allergen Reactions  . Morphine And Related Swelling  . Penicillins Nausea And Vomiting       Follow-up Information   Follow up with LUKING,SCOTT, MD. Schedule an appointment as soon as possible for a visit in 2 weeks. (for evaluation of symptoms)    Specialty:  Family Medicine   Contact information:   [address] [place] [address] [phone]       Follow up with [facility], Darleen Crocker, MD. (office will call)    Specialty:  Neurology   Contact information:   [address] [place] [address] [phone]        The results of significant diagnostics from this hospitalization (including imaging, microbiology, ancillary and laboratory) are listed below for reference.    Significant Diagnostic Studies: Dg Chest 2 View  08/16/2013   CLINICAL DATA:  Chest pain  EXAM: CHEST  2 VIEW  COMPARISON:  02/17/2013  FINDINGS: The heart size and mediastinal contours are within normal limits. Both lungs are clear. The visualized skeletal structures are  unremarkable.  IMPRESSION: No active cardiopulmonary disease.   Electronically Signed   By: Alcide Clever M.D.   On: 08/16/2013 19:40   Ct Head Wo Contrast  08/19/2013   *RADIOLOGY REPORT*  Clinical Data: Numbness and weakness in both legs.  CT HEAD WITHOUT CONTRAST  Technique:  Contiguous axial images were obtained from the base of the skull through the vertex without contrast.  Comparison: None.  Findings: There is no evidence of acute infarction, mass lesion, or intra- or extra-axial hemorrhage on CT.  The posterior fossa, including the cerebellum, brainstem and fourth ventricle, is within normal limits.  The third and lateral ventricles, and basal ganglia are unremarkable in appearance.  The cerebral hemispheres are symmetric in appearance, with normal gray- white differentiation.  No mass effect or midline shift is seen.  There is no evidence of fracture; visualized osseous structures are unremarkable in appearance.  The visualized portions of the orbits are within  normal limits.  The paranasal sinuses and mastoid air cells are well-aerated.  No significant soft tissue abnormalities are seen.  IMPRESSION: Unremarkable noncontrast CT of the head.   Original Report Authenticated By: Tonia Ghent, M.D.   Ct Angio Chest Pe W/cm &/or Wo Cm  08/19/2013   *RADIOLOGY REPORT*  Clinical Data: Chest pain, numbness and shortness of breath.  CT ANGIOGRAPHY CHEST  Technique:  Multidetector CT imaging of the chest using the standard protocol during bolus administration of intravenous contrast. Multiplanar reconstructed images including MIPs were obtained and reviewed to evaluate the vascular anatomy.  Contrast: OMNIPAQUE IOHEXOL 350 MG/ML SOLN  Comparison: Chest radiograph performed 11/18/2012, and CTA of the chest performed 04/06/2012  Findings: There is no evidence of pulmonary embolus.  The lungs are clear bilaterally.  There is no evidence of significant focal consolidation, pleural effusion or pneumothorax.  No masses are identified; no abnormal focal contrast enhancement is seen.  The mediastinum is unremarkable in appearance.  No mediastinal lymphadenopathy is seen.  No pericardial effusion identified.  The great vessels are grossly unremarkable in appearance.  No axillary lymphadenopathy is seen.  The visualized portions of the thyroid gland are unremarkable in appearance.  The visualized portions of the liver and spleen are unremarkable. The patient is status post cholecystectomy, with clips noted at the gallbladder fossa.  No acute osseous abnormalities are seen.  IMPRESSION: No evidence of pulmonary embolus; unremarkable CTA of the chest.   Original Report Authenticated By: Tonia Ghent, M.D.   Mr Laqueta Jean Wo Contrast  08/19/2013   CLINICAL DATA:  Headaches and bilateral leg weakness.  EXAM: MRI HEAD WITHOUT AND WITH CONTRAST  TECHNIQUE: Multiplanar, multiecho pulse sequences of the brain and surrounding structures were obtained according to standard protocol without and with intravenous contrast  CONTRAST:  20mL MULTIHANCE GADOBENATE DIMEGLUMINE 529 MG/ML IV SOLN  COMPARISON:  08/19/2013 head CT. No comparison brain MR.  FINDINGS: No acute infarct.  No intracranial hemorrhage.  Scattered nonspecific white matter changes most notable left frontal operculum region. In a patient of this age and gender, demyelinating process needs to be considered. Other considerations include that secondary to ; small vessel disease, vasculitis, inflammatory process or migraine headaches.  None of these areas demonstrate enhancement or restricted motion as may be seen with areas of active demyelination.  No intracranial mass or abnormal enhancement separate from above described findings.  No hydrocephalus.  Major intracranial vascular structures are patent. Right vertebral artery is small in caliber.  Nonspecific 7 mm subcutaneous lesion right frontal region.  IMPRESSION: Nonspecific white matter type changes as detailed above.    Electronically Signed   By: Bridgett Larsson M.D.   On: 08/19/2013 14:12   Mr Cervical Spine Wo Contrast  08/20/2013   CLINICAL DATA:  52 year old female with headache, bilateral lower extremity weakness. Left hemi para cysts. Possible demyelinating disease.  EXAM: MRI CERVICAL SPINE WITHOUT CONTRAST  TECHNIQUE: Multiplanar, multisequence MR imaging was performed. No intravenous contrast was administered.  COMPARISON:  Brain MRI 08/19/2013.  FINDINGS: Straightening of cervical lordosis. No marrow edema or evidence of acute osseous abnormality. Cervicomedullary junction is within normal limits.  No abnormal cervical spinal cord signal. Negative visualized upper thoracic spinal cord.  Capacious, widely patent cervical spinal canal. No spinal stenosis. No significant disc degeneration. No cervical foraminal stenosis.  Visualized paraspinal soft tissues are within normal limits.  IMPRESSION: No cervical spinal cord signal abnormality to suggest demyelinating disease. Normal MRI appearance of the cervical spine.  Electronically Signed   By: Augusto Gamble M.D.   On: 08/20/2013 14:38   Dg Chest Port 1 View  08/18/2013   CLINICAL DATA:  Chest pain  EXAM: PORTABLE CHEST - 1 VIEW  COMPARISON:  08/16/2013  FINDINGS: The heart size and mediastinal contours are within normal limits. Both lungs are clear. The visualized skeletal structures are unremarkable.  IMPRESSION: No active disease.   Electronically Signed   By: Alcide Clever M.D.   On: 08/18/2013 21:39   Dg Fluoro Guide Lumbar Puncture  08/20/2013   CLINICAL DATA:  Left side weakness question multiple sclerosis, abnormal MRI brain  EXAM: DIAGNOSTIC LUMBAR PUNCTURE UNDER FLUOROSCOPIC GUIDANCE  FLUOROSCOPY TIME:  0 minutes 36 seconds  PROCEDURE: Informed consent was obtained from the patient prior to the procedure, including potential complications of headache, allergy, and pain. With the patient prone, the lower back was prepped with Betadine. 1% Lidocaine was used for  local anesthesia. Lumbar puncture was performed at the L4-L5 level using a gauge needle with return of clear colorless CSF with an opening pressure of 12 cm water (prone). 9ml of CSF were obtained for laboratory studies. The patient tolerated the procedure well and there were no apparent complications.  IMPRESSION: Fluoroscopic guided lumbar puncture as above.   Electronically Signed   By: Ulyses Southward M.D.   On: 08/20/2013 14:56    Microbiology: No results found for this or any previous visit (from the past 240 hour(s)).   Labs: Basic Metabolic Panel:  Recent Labs Lab 08/16/13 1917 08/18/13 2124 08/19/13 0539 08/20/13 0508  NA 140 142  --  141  K 4.2 3.6  --  4.3  CL 102 103  --  105  CO2 28 24  --  26  GLUCOSE 107* 102*  --  88  BUN 11 12  --  12  CREATININE 0.87 0.83  --  0.90  CALCIUM 10.4 10.0  --  10.4  MG  --   --  1.8  --    Liver Function Tests: No results found for this basename: AST, ALT, ALKPHOS, BILITOT, PROT, ALBUMIN,  in the last 168 hours No results found for this basename: LIPASE, AMYLASE,  in the last 168 hours No results found for this basename: AMMONIA,  in the last 168 hours CBC:  Recent Labs Lab 08/16/13 1917 08/18/13 2124 08/20/13 0508  WBC 9.9 11.6* 9.2  NEUTROABS 5.7 8.2*  --   HGB 14.8 15.1* 13.6  HCT 43.1 43.6 39.8  MCV 88.9 87.7 89.0  PLT 307 301 296   Cardiac Enzymes:  Recent Labs Lab 08/16/13 1917 08/18/13 2124  TROPONINI <0.30 <0.30   BNP: BNP (last 3 results) No results found for this basename: PROBNP,  in the last 8760 hours CBG: No results found for this basename: GLUCAP,  in the last 168 hours     Signed:  Gwenyth Bender  [facility] 08/21/2013, 1:52 PM   Patient seen examined; agree with the above plan of care  Kidus Delman N

## 2013-08-21 NOTE — Progress Notes (Signed)
Physical Therapy Treatment Patient Details Name: Kristine Miller MRN: [id number] DOB: 10-29-1961 Today's Date: 08/21/2013 Time: 4098-1191 PT Time Calculation (min): 24 min CHARGES 1 GT 1 TE  PT Assessment / Plan / Recommendation           Patient presents with observable LLE weakness: hip flexion 4/5 Knee flex/ext 3+/5. Patient is able to complete all therex bilaterally. Patient c/o of feeling "shaky" inside the leftside of her body. Patient completed 70' of gait with RW:Min A due to complaints of LLE feeling weak and L knee "like jelly";no lob Instructed patient on daily HEP of bed exercises, patient demonstrated proper technique.                                               Precautions / Restrictions Precautions Precautions: Fall Restrictions Weight Bearing Restrictions: No   Pertinent Vitals/Pain No pain reported    Mobility  Bed Mobility Supine to Sit: 6: Modified independent (Device/Increase time) Transfers Sit to Stand: 5: Supervision (verbal cueing for hand placement) Ambulation/Gait Ambulation/Gait Assistance: 4: Min guard (due pt c/o of weak "jittery" LLE) Ambulation Distance (Feet): 70 Feet Assistive device: Rolling walker Gait Pattern: Decreased hip/knee flexion - left;Decreased step length - left Gait velocity: slow Stairs: No Wheelchair Mobility Wheelchair Mobility: No    Exercises General Exercises - Lower Extremity Long Arc Quad: AROM;Both;15 reps;Seated Heel Slides: AROM;Both;5 reps;Limitations;Supine Heel Slides Limitations: visibily more difficult to perform with LLE Hip ABduction/ADduction: Supine;Seated;AROM;Both;Other (comment);20 reps (Patient completed 5 reps supine/15 reps seated; LLE weaker) Straight Leg Raises: AROM;Both;5 reps;Supine;Limitations Straight Leg Raises Limitations: LLE weaker Hip Flexion/Marching: Standing;Other (comment) (completed with RW) Other Exercises Other Exercises: Weightshifting activities ;  fwd/back and side<>side   PT Diagnosis:    PT Problem List:   PT Treatment Interventions:     PT Goals (current goals can now be found in the care plan section)    Visit Information  Last PT Received On: 08/21/13 Assistance Needed: +1                   End of Session PT - End of Session Equipment Utilized During Treatment: Gait belt Activity Tolerance: Patient tolerated treatment well Patient left: in bed;with family/visitor present   GP     Nesanel Aguila ATKINSO 08/21/2013, 10:09 AM

## 2013-08-23 ENCOUNTER — Ambulatory Visit (HOSPITAL_COMMUNITY)
Admit: 2013-08-23 | Discharge: 2013-08-23 | Disposition: A | Discharge status: Home / Self Care | Payer: Medicaid Other | Source: Ambulatory Visit | Attending: Family Medicine | Admitting: Family Medicine

## 2013-08-23 DIAGNOSIS — M6281 Muscle weakness (generalized): Secondary | ICD-10-CM | POA: Insufficient documentation

## 2013-08-23 DIAGNOSIS — G8194 Hemiplegia, unspecified affecting left nondominant side: Secondary | ICD-10-CM | POA: Insufficient documentation

## 2013-08-23 DIAGNOSIS — R262 Difficulty in walking, not elsewhere classified: Secondary | ICD-10-CM | POA: Insufficient documentation

## 2013-08-23 DIAGNOSIS — IMO0001 Reserved for inherently not codable concepts without codable children: Secondary | ICD-10-CM | POA: Insufficient documentation

## 2013-08-23 DIAGNOSIS — G35 Multiple sclerosis: Secondary | ICD-10-CM

## 2013-08-23 NOTE — Progress Notes (Signed)
Physical Therapy Evaluation  Patient Details  Name: Kristine Miller MRN: [id number] Date of Birth: 06/12/1961  Today's Date: 08/23/2013 Time: 1105-1145 PT Time Calculation (min): 40 min Charges: 1 evaluation             Visit#: 1 of 1  Re-eval:   Assessment Diagnosis: Bil LE weakness Next MD Visit: Dr. Lilyan Punt - unscheduled; Dr. Marijean Niemann - Oct 28th Prior Therapy: None  Authorization: Medicaid potential    Authorization Time Period:    Authorization Visit#: 1 of 1   Past Medical History:  Past Medical History  Diagnosis Date  . Rotator cuff arthropathy, left   . IBS (irritable bowel syndrome)   . Pneumonia   . Asthma   . Palpitations   . Anxiety    Past Surgical History:  Past Surgical History  Procedure Laterality Date  . Abdominal hysterectomy    . Rotator cuff repair    . Cholecystectomy    . Appendectomy    . Cesarean section      Subjective Symptoms/Limitations Symptoms: Pt is a 52 year old female referred to PT for LLE generalized weakness from multiple lesions to her brain, which may be a sign of multiple sclerosis according to her PCP.  At this time pt will f/u with a neurologist to discuss the findings of her brain scan and the type of multiple sclerosis that she has.  At this time pt is currently ambulating with a rolling walker at home and is confident enough to ambulate without a RW for a few steps and reports that her husband makes her use her RW.  She states she is a very active individual and is willing to work hard to get better and continues to do the exercises that were given to her while she was here in the hospital.   Pain: 6/10 to her head.   Precautions/Restrictions  Precautions Precautions: Fall  Balance Screening Balance Screen Has the patient fallen in the past 6 months: No Has the patient had a decrease in activity level because of a fear of falling? : Yes Is the patient reluctant to leave their home because of a fear of falling? :  No  Prior Functioning  Home Living Family/patient expects to be discharged to:: Private residence Living Arrangements: Spouse/significant other Available Help at Discharge: Family Type of Home: House Home Access: Stairs to enter Secretary/administrator of Steps: 4 Home Layout: One level Home Equipment: Environmental consultant - 2 wheels;Wheelchair - manual;Crutches  Cognition/Observation Cognition Overall Cognitive Status: Within Functional Limits for tasks assessed  Sensation/Coordination/Flexibility/Functional Tests Coordination Gross Motor Movements are Fluid and Coordinated: No Coordination and Movement Description: impaired transverse abdmonius  Assessment RLE Strength Right Hip Flexion: 5/5 Right Hip Extension: 5/5 Right Hip ABduction: 5/5 Right Hip ADduction: 4/5 Right Knee Flexion: 5/5 Right Knee Extension: 5/5 LLE Strength Left Hip Flexion: 3/5 Left Hip Extension: 3/5 Left Hip ABduction: 3/5 Left Hip ADduction: 3/5 Left Knee Flexion: 3/5 Left Knee Extension: 3/5 Left Ankle Dorsiflexion: 3/5  Exercise/Treatments Standing Heel Raises: 10 reps;Limitations Heel Raises Limitations: Toe Raises 10 reps Knee Flexion: Left;10 reps;Limitations Knee Flexion Limitations: 3 sec holds Side Lunges: 5 reps Functional Squat: 10 reps Supine Bridges: 10 reps;Limitations Bridges Limitations: 3 sec holds Other Supine Knee Exercises: chin lifts 5x3 sec holds Other Supine Knee Exercises: ab sets: 5x5 sec; bent knee raises Sidelying Hip ABduction: Left;10 reps Prone  Hamstring Curl: 15 reps Hip Extension: Left;10 reps    Physical Therapy Assessment and Plan PT Assessment  and Plan Clinical Impression Statement: Pt is a 52 year old female referred to PT for LLE weakness s/p MS with impairments listed below.  Due to insurance limitations pt will attend 1 visit for HEP and reports compliance in the past with HEP for shoulder. Provided with 3 pages of exercises including: energy conservation  exercises, exercise when she is feeling good and theraband for her shoulder which she has at home.  Pt will benefit from skilled therapeutic intervention in order to improve on the following deficits: Decreased balance;Decreased activity tolerance;Decreased strength;Difficulty walking;Pain Rehab Potential: Fair PT Frequency:  (1 visit) PT Plan: D/C with HEP    Goals Home Exercise Program Pt/caregiver will Perform Home Exercise Program: Independently PT Goal: Perform Home Exercise Program - Progress: Met  Problem List Patient Active Problem List   Diagnosis Date Noted  . Difficulty in walking(719.7) 08/23/2013  . Hemiplegia affecting left nondominant side 08/23/2013  . Abnormal brain MRI 08/20/2013  . Anxiety state, unspecified 08/19/2013  . 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  . 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 Leukocytosis, unspecified 08/19/2013  . Chest pain 04/06/2012  . Palpitations 04/06/2012  . Obesity 04/06/2012  . Hypokalemia 04/06/2012    PT Plan of Care PT Home Exercise Plan: given Consulted and Agree with Plan of Care: Patient  GP    Leonila Antionette Luster, MPT, ATC 08/23/2013, 1:24 PM  Physician Documentation Your signature is required to indicate approval of the treatment plan as stated above.  Please sign and either send electronically or make a copy of this report for your files and return this physician signed original.   Please mark one 1.__approve of plan  2. ___approve of plan with the following conditions.   ______________________________                                                          _____________________ Physician Signature                                                                                                             Date

## 2013-08-27 LAB — MULTIPLE SCLEROSIS PANEL 2
Albumin CSF: 14 mg/dL (ref <35)
Albumin Index: 3.6 (ref <9.0)
Albumin: 3880 mg/dL (ref 3700–5410)
CNS-IgG Synthesis Rate: <0.1 mg/24hr (ref <3.3)
IgA CSF: 0.18 mg/dL (ref 0.15–0.60)
IgA MSPROF: <0.001 mg/dL (ref <0.010)
IgA Total: 227 mg/dL (ref 81–463)
IgG-Index: 0.44 (ref <0.70)
IgG: <0.01 mg/dL (ref <0.10)
IgM MSPROF: <0.001 mg/dL (ref <0.010)
IgM Total: 126 mg/dL (ref 48–271)
IgM-CSF: <0.03 mg/dL (ref <0.10)

## 2013-09-11 ENCOUNTER — Encounter (HOSPITAL_COMMUNITY)
Admission: RE | Admit: 2013-09-11 | Discharge: 2013-09-11 | Disposition: A | Discharge status: Home / Self Care | Payer: Medicaid Other | Source: Ambulatory Visit | Attending: Neurology | Admitting: Neurology

## 2013-09-11 DIAGNOSIS — G35 Multiple sclerosis: Secondary | ICD-10-CM | POA: Insufficient documentation

## 2013-09-11 MED ORDER — SODIUM CHLORIDE 0.9 % IV SOLN
INTRAVENOUS | Status: DC
  Administered 2013-09-11: 12:00:00 via INTRAVENOUS

## 2013-09-11 MED ORDER — SODIUM CHLORIDE 0.9 % IV SOLN
1000.0000 mg | Freq: Every day | INTRAVENOUS | Status: DC
  Administered 2013-09-11: 1000 mg via INTRAVENOUS
  Filled 2013-09-11: qty 8

## 2013-09-12 ENCOUNTER — Encounter (HOSPITAL_COMMUNITY)
Admission: RE | Admit: 2013-09-12 | Discharge: 2013-09-12 | Disposition: A | Discharge status: Home / Self Care | Payer: Medicaid Other | Source: Ambulatory Visit | Attending: Neurology | Admitting: Neurology

## 2013-09-12 MED ORDER — SODIUM CHLORIDE 0.9 % IV SOLN
INTRAVENOUS | Status: DC
  Administered 2013-09-12: 200 mL via INTRAVENOUS

## 2013-09-12 MED ORDER — SODIUM CHLORIDE 0.9 % IV SOLN
1000.0000 mg | Freq: Every day | INTRAVENOUS | Status: DC
  Administered 2013-09-12: 1000 mg via INTRAVENOUS
  Filled 2013-09-12: qty 8

## 2013-09-12 MED ORDER — SODIUM CHLORIDE 0.9 % IJ SOLN
10.0000 mL | Freq: Once | INTRAMUSCULAR | Status: AC
  Administered 2013-09-12: 10 mL via INTRAVENOUS

## 2013-09-12 NOTE — Progress Notes (Signed)
Pt arrived for 2nd dose solumedrol. IV site saline locked, flushed well with saline. Tolerated procedure well. IV flushed with saline after med infused.Marland Kitchen

## 2013-09-13 ENCOUNTER — Encounter (HOSPITAL_COMMUNITY): Payer: Self-pay

## 2013-09-13 ENCOUNTER — Encounter (HOSPITAL_COMMUNITY)
Admission: RE | Admit: 2013-09-13 | Discharge: 2013-09-13 | Disposition: A | Discharge status: Home / Self Care | Payer: Medicaid Other | Source: Ambulatory Visit | Attending: Neurology | Admitting: Neurology

## 2013-09-13 MED ORDER — SODIUM CHLORIDE 0.9 % IV SOLN
1000.0000 mg | Freq: Once | INTRAVENOUS | Status: AC
  Administered 2013-09-13: 1000 mg via INTRAVENOUS
  Filled 2013-09-13: qty 8

## 2013-09-13 MED ORDER — SODIUM CHLORIDE 0.9 % IJ SOLN
10.0000 mL | Freq: Once | INTRAMUSCULAR | Status: AC
  Administered 2013-09-13: 10 mL via INTRAVENOUS

## 2013-09-13 MED ORDER — METHYLPREDNISOLONE SODIUM SUCC 125 MG IJ SOLR
1000.0000 mg | Freq: Once | INTRAMUSCULAR | Status: DC
  Filled 2013-09-13: qty 16

## 2014-03-28 ENCOUNTER — Telehealth: Payer: Self-pay | Admitting: Family Medicine

## 2014-03-28 NOTE — Telephone Encounter (Signed)
Patient needs a referral for her MS.

## 2014-03-30 ENCOUNTER — Other Ambulatory Visit: Payer: Self-pay | Admitting: Family Medicine

## 2014-03-30 DIAGNOSIS — G35 Multiple sclerosis: Secondary | ICD-10-CM

## 2014-03-30 NOTE — Telephone Encounter (Signed)
The referral was put in. This patient was seeing a local neurologist. We do not have any aspect in her care she just asked that we help make the referral

## 2014-04-27 ENCOUNTER — Emergency Department (HOSPITAL_COMMUNITY): Payer: Medicaid Other

## 2014-04-27 ENCOUNTER — Encounter (HOSPITAL_COMMUNITY): Payer: Self-pay | Admitting: Emergency Medicine

## 2014-04-27 ENCOUNTER — Emergency Department (HOSPITAL_COMMUNITY)
Admission: EM | Admit: 2014-04-27 | Discharge: 2014-04-28 | Disposition: A | Discharge status: Home / Self Care | Payer: Medicaid Other | Attending: Emergency Medicine | Admitting: Emergency Medicine

## 2014-04-27 DIAGNOSIS — Z7982 Long term (current) use of aspirin: Secondary | ICD-10-CM | POA: Insufficient documentation

## 2014-04-27 DIAGNOSIS — Z8739 Personal history of other diseases of the musculoskeletal system and connective tissue: Secondary | ICD-10-CM | POA: Insufficient documentation

## 2014-04-27 DIAGNOSIS — Z880 Allergy status to penicillin: Secondary | ICD-10-CM | POA: Diagnosis not present

## 2014-04-27 DIAGNOSIS — J45909 Unspecified asthma, uncomplicated: Secondary | ICD-10-CM | POA: Insufficient documentation

## 2014-04-27 DIAGNOSIS — G8929 Other chronic pain: Secondary | ICD-10-CM | POA: Insufficient documentation

## 2014-04-27 DIAGNOSIS — Z8719 Personal history of other diseases of the digestive system: Secondary | ICD-10-CM | POA: Diagnosis not present

## 2014-04-27 DIAGNOSIS — Z8701 Personal history of pneumonia (recurrent): Secondary | ICD-10-CM | POA: Insufficient documentation

## 2014-04-27 DIAGNOSIS — G35 Multiple sclerosis: Secondary | ICD-10-CM | POA: Insufficient documentation

## 2014-04-27 DIAGNOSIS — R5381 Other malaise: Secondary | ICD-10-CM | POA: Diagnosis not present

## 2014-04-27 DIAGNOSIS — Z79899 Other long term (current) drug therapy: Secondary | ICD-10-CM | POA: Diagnosis not present

## 2014-04-27 DIAGNOSIS — R5383 Other fatigue: Secondary | ICD-10-CM | POA: Diagnosis not present

## 2014-04-27 DIAGNOSIS — Z8659 Personal history of other mental and behavioral disorders: Secondary | ICD-10-CM | POA: Insufficient documentation

## 2014-04-27 DIAGNOSIS — R079 Chest pain, unspecified: Secondary | ICD-10-CM | POA: Diagnosis present

## 2014-04-27 HISTORY — DX: Multiple sclerosis: G35

## 2014-04-27 LAB — COMPREHENSIVE METABOLIC PANEL
ALBUMIN: 3.6 g/dL (ref 3.5–5.2)
ALT: 13 U/L (ref 0–35)
AST: 15 U/L (ref 0–37)
Alkaline Phosphatase: 82 U/L (ref 39–117)
BILIRUBIN TOTAL: 0.2 mg/dL — AB (ref 0.3–1.2)
BUN: 13 mg/dL (ref 6–23)
CALCIUM: 9.2 mg/dL (ref 8.4–10.5)
CO2: 25 mEq/L (ref 19–32)
CREATININE: 0.9 mg/dL (ref 0.50–1.10)
Chloride: 105 mEq/L (ref 96–112)
GFR calc Af Amer: 84 mL/min — ABNORMAL LOW (ref >90)
GFR calc non Af Amer: 73 mL/min — ABNORMAL LOW (ref >90)
Glucose, Bld: 104 mg/dL — ABNORMAL HIGH (ref 70–99)
Potassium: 4 mEq/L (ref 3.7–5.3)
SODIUM: 144 meq/L (ref 137–147)
Total Protein: 7 g/dL (ref 6.0–8.3)

## 2014-04-27 LAB — CBC WITH DIFFERENTIAL/PLATELET
BASOS PCT: 0 % (ref 0–1)
Basophils Absolute: 0 10*3/uL (ref 0.0–0.1)
Eosinophils Absolute: 0.1 10*3/uL (ref 0.0–0.7)
Eosinophils Relative: 1 % (ref 0–5)
HEMATOCRIT: 41.7 % (ref 36.0–46.0)
Hemoglobin: 14.2 g/dL (ref 12.0–15.0)
Lymphocytes Relative: 32 % (ref 12–46)
Lymphs Abs: 3.5 10*3/uL (ref 0.7–4.0)
MCH: 29.5 pg (ref 26.0–34.0)
MCHC: 34.1 g/dL (ref 30.0–36.0)
MCV: 86.5 fL (ref 78.0–100.0)
MONO ABS: 0.9 10*3/uL (ref 0.1–1.0)
Monocytes Relative: 8 % (ref 3–12)
NEUTROS ABS: 6.4 10*3/uL (ref 1.7–7.7)
Neutrophils Relative %: 59 % (ref 43–77)
PLATELETS: 334 10*3/uL (ref 150–400)
RBC: 4.82 MIL/uL (ref 3.87–5.11)
RDW: 13.3 % (ref 11.5–15.5)
WBC: 10.9 10*3/uL — AB (ref 4.0–10.5)

## 2014-04-27 LAB — TROPONIN I: Troponin I: <0.3 ng/mL (ref <0.30)

## 2014-04-27 MED ORDER — SODIUM CHLORIDE 0.9 % IV SOLN
1000.0000 mg | Freq: Once | INTRAVENOUS | Status: AC
  Administered 2014-04-28: 1000 mg via INTRAVENOUS
  Filled 2014-04-27: qty 8

## 2014-04-27 MED ORDER — METHYLPREDNISOLONE SODIUM SUCC 1000 MG IJ SOLR
INTRAMUSCULAR | Status: AC
  Filled 2014-04-27: qty 8

## 2014-04-27 MED ORDER — FAMOTIDINE IN NACL 20-0.9 MG/50ML-% IV SOLN
20.0000 mg | Freq: Once | INTRAVENOUS | Status: AC
  Administered 2014-04-28: 20 mg via INTRAVENOUS
  Filled 2014-04-27: qty 50

## 2014-04-27 MED ORDER — HYDROCODONE-ACETAMINOPHEN 5-325 MG PO TABS
2.0000 | ORAL_TABLET | Freq: Once | ORAL | Status: AC
  Administered 2014-04-27: 2 via ORAL
  Filled 2014-04-27: qty 2

## 2014-04-27 NOTE — Discharge Instructions (Signed)
Call Dr. Freddie Apley office in the morning to make arrangements for two additional doses of steroids.  I have spoken with him and he is expecting your call.  Return to the emergency department if you develop worsening symptoms, or other new and concerning symptoms.   Multiple Sclerosis Multiple sclerosis (MS) is a disease of the central nervous system. It leads to loss of the insulating covering of the nerves (myelin sheath) of your brain. When this happens, brain signals do not get transmitted properly or may not get transmitted at all. The symptoms of MS occur in episodes or attacks. These attacks may last weeks to months. There may be long periods of nearly no problems between attacks. The age of onset of MS varies.  CAUSES The cause of MS is unknown. However, it is more common in the [place] than in the [place]. RISK FACTORS There is a higher incidence of MS in women than in men. MS is not an inherited illness, although your risk of MS is higher if you have a relative with MS. SIGNS AND SYMPTOMS  The symptoms of MS occur in episodes or attacks. These attacks may last weeks to months. There may be long periods of almost no symptoms between attacks. The symptoms of MS vary. This is because of the many different ways it affects the central nervous system. The main symptoms of MS include:  Vision problems and eye pain.  Numbness.  Weakness.  Paralysis in your arms, hands, feet, and legs (extremities).  Balance problems.  Tremors. DIAGNOSIS  Your health care provider can diagnose MS with the help of imaging exams and lab tests. These may include specialized X-ray exams and spinal fluid tests. The best imaging exam to confirm a diagnosis of MS is MRI. TREATMENT  There is no known cure for MS, but there are medicines that can decrease the number and frequency of attacks. Steroids are often used for short-term relief. Physical and occupational therapy may also  help. HOME CARE INSTRUCTIONS   Take medicines as directed by your health care provider.  Exercise as directed by your health care provider. SEEK MEDICAL CARE IF: You begin to feel depressed. SEEK IMMEDIATE MEDICAL CARE IF:  You develop paralysis.  You develop problems with bladder, bowel, or sexual function.  You develop mental changes, such as forgetfulness or mood swings.  You have a seizure. Document Released: 10/28/2000 Document Revised: 08/21/2013 Document Reviewed: 07/08/2013 [facility] Patient Information 2014 [place].

## 2014-04-27 NOTE — ED Notes (Signed)
About two hours ago my chest started hurting, and was having numbness and tingling in my left arm, left leg, and in my neck going up into my head per pt. I have MS so I thought that was what it was per pt.

## 2014-04-27 NOTE — ED Provider Notes (Signed)
CSN: [id number]     Arrival date & time 04/27/14  1949 History  This chart was scribed for Veryl Speak, MD by Ludger Nutting, ED Scribe. This patient was seen in room APA05/APA05 and the patient's care was started 8:11 PM.    Chief Complaint  Patient presents with  . Chest Pain      The history is provided by the patient. No language interpreter was used.    HPI Comments: Kristine Miller is a 53 y.o. female with past medical history of MS who presents to the Emergency Department complaining of 2 hours of left sided chest pain. She describes the chest pain as sharp, stabbing and reports radiation to the left neck. She reports associated paresthesias to the LUE and the posterior aspect of the neck along with LLE weakness. She denies history of similar symptoms. She denies SOB.   Neurologist is Dr. [name]  Past Medical History  Diagnosis Date  . Rotator cuff arthropathy, left   . IBS (irritable bowel syndrome)   . Pneumonia   . Asthma   . Palpitations   . Anxiety   . MS (multiple sclerosis)    Past Surgical History  Procedure Laterality Date  . Abdominal hysterectomy    . Rotator cuff repair    . Cholecystectomy    . Appendectomy    . Cesarean section     Family History  Problem Relation Age of Onset  . Cancer Mother   . Diabetes Mother    History  Substance Use Topics  . Smoking status: Never Smoker   . Smokeless tobacco: Never Used  . Alcohol Use: No   OB History   Grav Para Term Preterm Abortions TAB SAB Ect Mult Living   5 2 2  3  3   2      Review of Systems  Respiratory: Negative for shortness of breath.   Cardiovascular: Positive for chest pain.  Neurological: Positive for weakness.       +paresthesia   All other systems reviewed and are negative.     Allergies  Morphine and related and Penicillins  Home Medications   Prior to Admission medications   Medication Sig Start Date End Date Taking? Authorizing Provider  aspirin EC 81 MG tablet Take 162  mg by mouth once as needed for moderate pain.   Yes Historical Provider, MD  aspirin-acetaminophen-caffeine (EXCEDRIN MIGRAINE) [phone] MG per tablet Take 2 tablets by mouth every 6 (six) hours as needed for headache.   Yes Historical Provider, MD  calcium-vitamin D (OSCAL WITH D) 500-200 MG-UNIT per tablet Take 1 tablet by mouth every morning.   Yes Historical Provider, MD   BP 139/82  Pulse 69  Temp(Src) 97.5 F (36.4 C) (Oral)  Resp 20  Ht 5' (1.524 m)  Wt 226 lb (102.513 kg)  BMI 44.14 kg/m2  SpO2 100% Physical Exam  Nursing note and vitals reviewed. Constitutional: She is oriented to person, place, and time. She appears well-developed and well-nourished.  HENT:  Head: Normocephalic and atraumatic.  Mouth/Throat: Oropharynx is clear and moist.  Eyes: Conjunctivae and EOM are normal. Pupils are equal, round, and reactive to light.  Neck: Normal range of motion. Neck supple.  Cardiovascular: Normal rate, regular rhythm and normal heart sounds.   Pulmonary/Chest: Effort normal and breath sounds normal. No respiratory distress. She has no wheezes. She has no rales.  Abdominal: Soft. She exhibits no distension. There is no tenderness. There is no rebound and no guarding.  Musculoskeletal:  Normal range of motion. She exhibits no edema and no tenderness.  Lymphadenopathy:    She has no cervical adenopathy.  Neurological: She is alert and oriented to person, place, and time. No cranial nerve deficit. Coordination normal.  Skin: Skin is warm and dry.  Psychiatric: She has a normal mood and affect.    ED Course  Procedures (including critical care time)  DIAGNOSTIC STUDIES: Oxygen Saturation is 100% on RA, normal by my interpretation.    COORDINATION OF CARE: 8:18 PM Discussed treatment plan with pt at bedside and pt agreed to plan.   Labs Review Labs Reviewed - No data to display  Imaging Review No results found.   EKG Interpretation   Date/Time:  Sunday April 27 2014  20:01:46 EDT Ventricular Rate:  71 PR Interval:  159 QRS Duration: 99 QT Interval:  403 QTC Calculation: 438 R Axis:   -7 Text Interpretation:  Sinus rhythm Abnormal R-wave progression, late  transition Borderline T abnormalities, anterior leads Baseline wander in  lead(s) V6 Confirmed by DELOS  MD, Adiah Guereca ([id number]) on 04/27/2014 11:06:18  PM      MDM   Final diagnoses:  None    Patient is a 53 year old female with history of multiple sclerosis that was diagnosed in October of last year. She is seeing Dr. [name] for this. She presents today with numbness and tingling in her left arm, left leg, neck, and chest that started earlier today. Workup reveals no indication of a cardiac etiology. CT of head reveals no evidence for stroke. I've discussed the patient's care with Dr. [name] who feels as though IV steroids are indicated. The patient will be given 1 g of Solu-Medrol and arrangements will be made for her to receive 2 more doses as an outpatient. They're to call Dr.Doonquah's office in the morning to make these arrangements.  I personally performed the services described in this documentation, which was scribed in my presence. The recorded information has been reviewed and is accurate.      Veryl Speak, MD 04/27/14 [phone]

## 2014-04-28 ENCOUNTER — Encounter (HOSPITAL_COMMUNITY): Payer: Self-pay | Admitting: Emergency Medicine

## 2014-04-28 ENCOUNTER — Emergency Department (HOSPITAL_COMMUNITY): Payer: Medicaid Other

## 2014-04-28 ENCOUNTER — Inpatient Hospital Stay (HOSPITAL_COMMUNITY)
Admission: EM | Admit: 2014-04-28 | Discharge: 2014-05-02 | DRG: 059 | Disposition: A | Discharge status: Home / Self Care | Payer: Medicaid Other | Attending: Internal Medicine | Admitting: Internal Medicine

## 2014-04-28 DIAGNOSIS — G35 Multiple sclerosis: Principal | ICD-10-CM | POA: Diagnosis present

## 2014-04-28 DIAGNOSIS — E669 Obesity, unspecified: Secondary | ICD-10-CM | POA: Diagnosis present

## 2014-04-28 DIAGNOSIS — G8190 Hemiplegia, unspecified affecting unspecified side: Secondary | ICD-10-CM | POA: Diagnosis present

## 2014-04-28 DIAGNOSIS — F411 Generalized anxiety disorder: Secondary | ICD-10-CM | POA: Diagnosis present

## 2014-04-28 DIAGNOSIS — K589 Irritable bowel syndrome without diarrhea: Secondary | ICD-10-CM | POA: Diagnosis present

## 2014-04-28 DIAGNOSIS — J45909 Unspecified asthma, uncomplicated: Secondary | ICD-10-CM | POA: Diagnosis present

## 2014-04-28 DIAGNOSIS — G8194 Hemiplegia, unspecified affecting left nondominant side: Secondary | ICD-10-CM | POA: Diagnosis present

## 2014-04-28 DIAGNOSIS — R7309 Other abnormal glucose: Secondary | ICD-10-CM | POA: Diagnosis present

## 2014-04-28 DIAGNOSIS — R5381 Other malaise: Secondary | ICD-10-CM | POA: Diagnosis present

## 2014-04-28 DIAGNOSIS — E876 Hypokalemia: Secondary | ICD-10-CM

## 2014-04-28 DIAGNOSIS — R51 Headache: Secondary | ICD-10-CM | POA: Diagnosis present

## 2014-04-28 DIAGNOSIS — R9089 Other abnormal findings on diagnostic imaging of central nervous system: Secondary | ICD-10-CM

## 2014-04-28 DIAGNOSIS — Z6841 Body Mass Index (BMI) 40.0 and over, adult: Secondary | ICD-10-CM

## 2014-04-28 DIAGNOSIS — T380X5A Adverse effect of glucocorticoids and synthetic analogues, initial encounter: Secondary | ICD-10-CM | POA: Diagnosis present

## 2014-04-28 DIAGNOSIS — Z833 Family history of diabetes mellitus: Secondary | ICD-10-CM

## 2014-04-28 DIAGNOSIS — M19019 Primary osteoarthritis, unspecified shoulder: Secondary | ICD-10-CM | POA: Diagnosis present

## 2014-04-28 DIAGNOSIS — R739 Hyperglycemia, unspecified: Secondary | ICD-10-CM

## 2014-04-28 DIAGNOSIS — R262 Difficulty in walking, not elsewhere classified: Secondary | ICD-10-CM

## 2014-04-28 DIAGNOSIS — R002 Palpitations: Secondary | ICD-10-CM

## 2014-04-28 DIAGNOSIS — R1310 Dysphagia, unspecified: Secondary | ICD-10-CM | POA: Diagnosis present

## 2014-04-28 DIAGNOSIS — R290 Tetany: Secondary | ICD-10-CM

## 2014-04-28 DIAGNOSIS — D72829 Elevated white blood cell count, unspecified: Secondary | ICD-10-CM | POA: Diagnosis present

## 2014-04-28 DIAGNOSIS — F444 Conversion disorder with motor symptom or deficit: Secondary | ICD-10-CM

## 2014-04-28 DIAGNOSIS — R5383 Other fatigue: Secondary | ICD-10-CM | POA: Diagnosis present

## 2014-04-28 DIAGNOSIS — G35D Multiple sclerosis, unspecified: Secondary | ICD-10-CM | POA: Diagnosis present

## 2014-04-28 DIAGNOSIS — R531 Weakness: Secondary | ICD-10-CM

## 2014-04-28 HISTORY — DX: Headache, unspecified: R51.9

## 2014-04-28 HISTORY — DX: Other chronic pain: G89.29

## 2014-04-28 HISTORY — DX: Headache: R51

## 2014-04-28 LAB — CBC WITH DIFFERENTIAL/PLATELET
Basophils Absolute: 0 10*3/uL (ref 0.0–0.1)
Basophils Relative: 0 % (ref 0–1)
EOS PCT: 0 % (ref 0–5)
Eosinophils Absolute: 0 10*3/uL (ref 0.0–0.7)
HEMATOCRIT: 43.6 % (ref 36.0–46.0)
Hemoglobin: 15.3 g/dL — ABNORMAL HIGH (ref 12.0–15.0)
LYMPHS ABS: 1.2 10*3/uL (ref 0.7–4.0)
LYMPHS PCT: 7 % — AB (ref 12–46)
MCH: 30.1 pg (ref 26.0–34.0)
MCHC: 35.1 g/dL (ref 30.0–36.0)
MCV: 85.8 fL (ref 78.0–100.0)
MONO ABS: 0.1 10*3/uL (ref 0.1–1.0)
Monocytes Relative: 1 % — ABNORMAL LOW (ref 3–12)
Neutro Abs: 15.3 10*3/uL — ABNORMAL HIGH (ref 1.7–7.7)
Neutrophils Relative %: 92 % — ABNORMAL HIGH (ref 43–77)
Platelets: 372 10*3/uL (ref 150–400)
RBC: 5.08 MIL/uL (ref 3.87–5.11)
RDW: 13.4 % (ref 11.5–15.5)
WBC: 16.7 10*3/uL — AB (ref 4.0–10.5)

## 2014-04-28 LAB — BASIC METABOLIC PANEL
BUN: 10 mg/dL (ref 6–23)
CALCIUM: 10 mg/dL (ref 8.4–10.5)
CO2: 23 meq/L (ref 19–32)
CREATININE: 0.79 mg/dL (ref 0.50–1.10)
Chloride: 102 mEq/L (ref 96–112)
GFR calc Af Amer: >90 mL/min (ref >90)
GFR calc non Af Amer: >90 mL/min (ref >90)
GLUCOSE: 166 mg/dL — AB (ref 70–99)
Potassium: 4.2 mEq/L (ref 3.7–5.3)
Sodium: 142 mEq/L (ref 137–147)

## 2014-04-28 MED ORDER — SODIUM CHLORIDE 0.9 % IV SOLN
1000.0000 mg | Freq: Once | INTRAVENOUS | Status: AC
  Administered 2014-04-28: 1000 mg via INTRAVENOUS
  Filled 2014-04-28: qty 8

## 2014-04-28 MED ORDER — ACETAMINOPHEN 325 MG PO TABS
650.0000 mg | ORAL_TABLET | Freq: Four times a day (QID) | ORAL | Status: DC | PRN
  Administered 2014-04-29: 650 mg via ORAL
  Filled 2014-04-28: qty 2

## 2014-04-28 MED ORDER — ONDANSETRON HCL 4 MG/2ML IJ SOLN
4.0000 mg | Freq: Four times a day (QID) | INTRAMUSCULAR | Status: DC | PRN
  Administered 2014-04-29 – 2014-04-30 (×4): 4 mg via INTRAVENOUS
  Filled 2014-04-28 (×4): qty 2

## 2014-04-28 MED ORDER — ALBUTEROL SULFATE (2.5 MG/3ML) 0.083% IN NEBU: 2.5000 mg | INHALATION_SOLUTION | RESPIRATORY_TRACT | Status: DC | PRN

## 2014-04-28 MED ORDER — METHYLPREDNISOLONE SODIUM SUCC 1000 MG IJ SOLR
INTRAMUSCULAR | Status: AC
  Filled 2014-04-28: qty 8

## 2014-04-28 MED ORDER — ONDANSETRON HCL 4 MG PO TABS: 4.0000 mg | ORAL_TABLET | Freq: Four times a day (QID) | ORAL | Status: DC | PRN

## 2014-04-28 MED ORDER — ASPIRIN-ACETAMINOPHEN-CAFFEINE 250-250-65 MG PO TABS
2.0000 | ORAL_TABLET | Freq: Four times a day (QID) | ORAL | Status: DC | PRN
  Filled 2014-04-28: qty 2

## 2014-04-28 MED ORDER — SODIUM CHLORIDE 0.9 % IV SOLN
INTRAVENOUS | Status: AC
  Administered 2014-04-28: 23:00:00 via INTRAVENOUS

## 2014-04-28 MED ORDER — LORAZEPAM 1 MG PO TABS
1.0000 mg | ORAL_TABLET | Freq: Once | ORAL | Status: AC
  Administered 2014-04-28: 1 mg via ORAL
  Filled 2014-04-28: qty 1

## 2014-04-28 MED ORDER — ENOXAPARIN SODIUM 40 MG/0.4ML ~~LOC~~ SOLN
40.0000 mg | SUBCUTANEOUS | Status: DC
  Administered 2014-04-28: 40 mg via SUBCUTANEOUS
  Filled 2014-04-28: qty 0.4

## 2014-04-28 MED ORDER — HYDROCODONE-ACETAMINOPHEN 5-325 MG PO TABS
2.0000 | ORAL_TABLET | Freq: Once | ORAL | Status: AC
  Administered 2014-04-28: 2 via ORAL
  Filled 2014-04-28: qty 2

## 2014-04-28 MED ORDER — SODIUM CHLORIDE 0.9 % IV SOLN
INTRAVENOUS | Status: DC
  Administered 2014-04-28: 17:00:00 via INTRAVENOUS

## 2014-04-28 MED ORDER — GADOBENATE DIMEGLUMINE 529 MG/ML IV SOLN
20.0000 mL | Freq: Once | INTRAVENOUS | Status: AC | PRN
  Administered 2014-04-28: 20 mL via INTRAVENOUS

## 2014-04-28 MED ORDER — HYDROCODONE-ACETAMINOPHEN 7.5-325 MG/15ML PO SOLN
10.0000 mL | ORAL | Status: DC | PRN
  Administered 2014-04-30: 10 mL via ORAL
  Filled 2014-04-28: qty 15

## 2014-04-28 MED ORDER — ACETAMINOPHEN 650 MG RE SUPP: 650.0000 mg | Freq: Four times a day (QID) | RECTAL | Status: DC | PRN

## 2014-04-28 MED ORDER — LORAZEPAM 0.5 MG PO TABS
0.5000 mg | ORAL_TABLET | Freq: Four times a day (QID) | ORAL | Status: DC | PRN
  Administered 2014-04-29: 0.5 mg via ORAL
  Filled 2014-04-28: qty 1

## 2014-04-28 MED ORDER — INSULIN ASPART 100 UNIT/ML ~~LOC~~ SOLN
0.0000 [IU] | Freq: Three times a day (TID) | SUBCUTANEOUS | Status: DC
  Administered 2014-04-29 (×2): 3 [IU] via SUBCUTANEOUS
  Administered 2014-04-29 – 2014-04-30 (×4): 2 [IU] via SUBCUTANEOUS
  Administered 2014-05-01: 3 [IU] via SUBCUTANEOUS
  Administered 2014-05-01 – 2014-05-02 (×2): 2 [IU] via SUBCUTANEOUS

## 2014-04-28 MED ORDER — INSULIN ASPART 100 UNIT/ML ~~LOC~~ SOLN: 0.0000 [IU] | Freq: Every day | SUBCUTANEOUS | Status: DC

## 2014-04-28 NOTE — ED Provider Notes (Signed)
CSN: [id number]     Arrival date & time 04/28/14  1553 History   First MD Initiated Contact with Patient 04/28/14 1615     Chief Complaint  Patient presents with  . Weakness      HPI Pt was seen at 1635. Per pt, c/o gradual onset and worsening of persistent left sided paresthesias for the past 2 days. Pt was evaluated in the ED last night for same, given dose of IV steroid for likely exacerbation of MS, and told to f/u with her Neuro MD today. Pt states today she "woke up unable to walk" due to LLE weakness, and feels she cannot use her walker "because both of my arms are weak too." Pt called her Neuro Dr. [name] for these new symptoms and was told to come to the ED for further evaluation and admission for IV steroids. Denies fevers, no falls, no CP/SOB, no abd pain, no back or neck pain, no rash.    Past Medical History  Diagnosis Date  . Rotator cuff arthropathy, left   . IBS (irritable bowel syndrome)   . Pneumonia   . Asthma   . Palpitations   . Anxiety   . MS (multiple sclerosis)   . Chronic headaches    Past Surgical History  Procedure Laterality Date  . Abdominal hysterectomy    . Rotator cuff repair    . Cholecystectomy    . Appendectomy    . Cesarean section     Family History  Problem Relation Age of Onset  . Cancer Mother   . Diabetes Mother    History  Substance Use Topics  . Smoking status: Never Smoker   . Smokeless tobacco: Never Used  . Alcohol Use: No   OB History   Grav Para Term Preterm Abortions TAB SAB Ect Mult Living   5 2 2  3  3   2      Review of Systems ROS: Statement: All systems negative except as marked or noted in the HPI; Constitutional: Negative for fever and chills. ; ; Eyes: Negative for eye pain, redness and discharge. ; ; ENMT: Negative for ear pain, hoarseness, nasal congestion, sinus pressure and sore throat. ; ; Cardiovascular: Negative for chest pain, palpitations, diaphoresis, dyspnea and peripheral edema. ; ; Respiratory:  Negative for cough, wheezing and stridor. ; ; Gastrointestinal: Negative for nausea, vomiting, diarrhea, abdominal pain, blood in stool, hematemesis, jaundice and rectal bleeding. . ; ; Genitourinary: Negative for dysuria, flank pain and hematuria. ; ; Musculoskeletal: Negative for back pain and neck pain. Negative for swelling and trauma.; ; Skin: Negative for pruritus, rash, abrasions, blisters, bruising and skin lesion.; ; Neuro: +extremity weakness, paresthesias. Negative for headache, lightheadedness and neck stiffness. Negative for altered level of consciousness , altered mental status, involuntary movement, seizure and syncope.      Allergies  Morphine and related and Penicillins  Home Medications   Prior to Admission medications   Medication Sig Start Date End Date Taking? Authorizing Provider  aspirin EC 81 MG tablet Take 162 mg by mouth once as needed for moderate pain.   Yes Historical Provider, MD  aspirin-acetaminophen-caffeine (EXCEDRIN MIGRAINE) [phone] MG per tablet Take 2 tablets by mouth every 6 (six) hours as needed for headache.   Yes Historical Provider, MD  calcium-vitamin D (OSCAL WITH D) 500-200 MG-UNIT per tablet Take 1 tablet by mouth every morning.   Yes Historical Provider, MD   BP 156/81  Pulse 110  Temp(Src) 98.1 F (36.7 C) (  Oral)  Resp 20  Wt 226 lb (102.513 kg)  SpO2 95% Physical Exam 1640: Physical examination:  Nursing notes reviewed; Vital signs and O2 SAT reviewed;  Constitutional: Well developed, Well nourished, Well hydrated, In no acute distress; Head:  Normocephalic, atraumatic; Eyes: EOMI, PERRL, No scleral icterus; ENMT: Mouth and pharynx normal, Mucous membranes moist; Neck: Supple, Full range of motion, No lymphadenopathy; Cardiovascular: Regular rate and rhythm, No murmur, rub, or gallop; Respiratory: Breath sounds clear & equal bilaterally, No rales, rhonchi, wheezes.  Speaking full sentences with ease, Normal respiratory effort/excursion;  Chest: Nontender, Movement normal; Abdomen: Soft, Nontender, Nondistended, Normal bowel sounds; Genitourinary: No CVA tenderness; Extremities: Pulses normal, No tenderness, No edema, No calf edema or asymmetry.; Neuro: AA&Ox3, Major CN grossly intact.Speech clear.  No facial droop.  No nystagmus. Grips equal. Strength 4/5 equal bilat UE's and RLE, strength 2/5 LLE.  DTR 2/4 equal bilat UE's and LE's. +entire left face, LUE and LLE subjective decreased sensation. Normal cerebellar testing bilat UE's (finger-nose) and RLE (heel-shin), unable to perform on LLE.; Skin: Color normal, Warm, Dry.    ED Course  Procedures   1700:  T/C to Neuro Dr. [name], case discussed, including:  HPI, pertinent PM/SHx, VS/PE, dx testing, ED course and treatment:  Requests to admit to Hospitalist service, he will consult later today.   1925:  IV solumedrol given. MRI without CVA. Dx and testing d/w pt and family.  Questions answered.  Verb understanding, agreeable to admit. T/C to [facility] Dr. [place] Pink, case discussed, including:  HPI, pertinent PM/SHx, VS/PE, dx testing, ED course and treatment:  Agreeable to admit, requests to write temporary orders, obtain inpt medical bed to team 1.  MDM  MDM Reviewed: previous chart, nursing note and vitals Reviewed previous: labs Interpretation: labs and MRI    Results for orders placed during the hospital encounter of 04/28/14  CBC WITH DIFFERENTIAL      Result Value Ref Range   WBC 16.7 (*) 4.0 - 10.5 K/uL   RBC 5.08  3.87 - 5.11 MIL/uL   Hemoglobin 15.3 (*) 12.0 - 15.0 g/dL   HCT 43.6  36.0 - 46.0 %   MCV 85.8  78.0 - 100.0 fL   MCH 30.1  26.0 - 34.0 pg   MCHC 35.1  30.0 - 36.0 g/dL   RDW 13.4  11.5 - 15.5 %   Platelets 372  150 - 400 K/uL   Neutrophils Relative % 92 (*) 43 - 77 %   Neutro Abs 15.3 (*) 1.7 - 7.7 K/uL   Lymphocytes Relative 7 (*) 12 - 46 %   Lymphs Abs 1.2  0.7 - 4.0 K/uL   Monocytes Relative 1 (*) 3 - 12 %   Monocytes Absolute 0.1  0.1 - 1.0  K/uL   Eosinophils Relative 0  0 - 5 %   Eosinophils Absolute 0.0  0.0 - 0.7 K/uL   Basophils Relative 0  0 - 1 %   Basophils Absolute 0.0  0.0 - 0.1 K/uL  BASIC METABOLIC PANEL      Result Value Ref Range   Sodium 142  137 - 147 mEq/L   Potassium 4.2  3.7 - 5.3 mEq/L   Chloride 102  96 - 112 mEq/L   CO2 23  19 - 32 mEq/L   Glucose, Bld 166 (*) 70 - 99 mg/dL   BUN 10  6 - 23 mg/dL   Creatinine, Ser 0.79  0.50 - 1.10 mg/dL   Calcium 10.0  8.4 -  10.5 mg/dL   GFR calc non Af Amer >90  >90 mL/min   GFR calc Af Amer >90  >90 mL/min   Ct Head Wo Contrast 04/27/2014   CLINICAL DATA:  Multiple sclerosis. Numbness in the left side of the face. History of multiple sclerosis.  EXAM: CT HEAD WITHOUT CONTRAST  TECHNIQUE: Contiguous axial images were obtained from the base of the skull through the vertex without intravenous contrast.  COMPARISON:  CT head 08/19/2013.  MRI brain 08/19/2013.  FINDINGS: No mass lesion, mass effect, midline shift, hydrocephalus, hemorrhage. No territorial ischemia or acute infarction. Scattered areas of periventricular low attenuation are present compatible with stated clinical history of multiple sclerosis and correlating with white matter lesions identified on prior examination.  IMPRESSION: No acute intracranial abnormality. Scattered periventricular white-matter lesion compatible with history of multiple sclerosis.   Electronically Signed   By: Dereck Ligas M.D.   On: 04/27/2014 22:03   Mr Jeri Cos DJ Contrast 04/28/2014   CLINICAL DATA:  Multiple sclerosis. Woke up with left leg weakness and inability to bear weight.  EXAM: MRI HEAD WITHOUT AND WITH CONTRAST  TECHNIQUE: Multiplanar, multiecho pulse sequences of the brain and surrounding structures were obtained without and with intravenous contrast.  CONTRAST:  68mL MULTIHANCE GADOBENATE DIMEGLUMINE 529 MG/ML IV SOLN  COMPARISON:  MRI brain 08/19/2013. MRI cervical spine today reported separately.  FINDINGS: No evidence for  acute infarction, hemorrhage, mass lesion, hydrocephalus, or extra-axial fluid. Normal for age cerebral volume. No large vessel or lacunar infarcts.  No diffusion abnormality or postcontrast enhancement to suggest an acute white matter lesion. There are scattered subcortical and periventricular signal abnormalities, largely subcentimeter in size, scattered throughout the supratentorial region, sparing the brainstem and cerebellum. No convincing involvement of the callosal-septal interface. No new lesions since October 2014.  Pituitary, pineal, and cerebellar tonsils unremarkable. No upper cervical lesions. Flow voids are maintained throughout the carotid, basilar, and vertebral arteries. There are no areas of chronic hemorrhage. Visualized calvarium, skull base, and upper cervical osseous structures unremarkable. Scalp and extracranial soft tissues, orbits, sinuses, and mastoids show no acute process. Incidental sebaceous cyst in the frontal scalp.  IMPRESSION: Stable nonspecific white matter signal abnormality. In the appropriate clinical setting these could be consistent with the diagnosis of multiple sclerosis, but there are no worrisome features, such as diffusion abnormality or postcontrast enhancement on today's exam, and no definite progression since 2014.   Electronically Signed   By: Rolla Flatten M.D.   On: 04/28/2014 19:17   Mr Cervical Spine W Wo Contrast 04/28/2014   CLINICAL DATA:  Diagnosed with MS. along with left leg weakness.  EXAM: MRI CERVICAL SPINE WITHOUT AND WITH CONTRAST  TECHNIQUE: Multiplanar and multiecho pulse sequences of the cervical spine, to include the craniocervical junction and cervicothoracic junction, were obtained according to standard protocol without and with intravenous contrast.  CONTRAST:  MultiHance 20 mL.  COMPARISON:  None.  FINDINGS: No evidence for disc degeneration, disc herniation, vertebral body abnormality or paraspinous mass.  Cord is normal size and signal  throughout the cervical spine. Negative visualized upper thoracic spinal cord.  No osseous findings. Cervicomedullary junction within normal limits. No abnormal enhancement postcontrast.  Unchanged appearance from prior normal study.  IMPRESSION: Negative cervical spine MRI without and with contrast.   Electronically Signed   By: Rolla Flatten M.D.   On: 04/28/2014 19:34       Alfonzo Feller, DO 05/01/14 1722

## 2014-04-28 NOTE — H&P (Signed)
[facility] History and Physical  Kristine Miller HXT:[id number] DOB: Nov 02, 1961 DOA: 04/28/2014   PCP: [name], MD  Specialists: He is followed by Dr. [name] with neurology for MS  Chief Complaint: Tingling, numbness, and weakness on the left side  HPI: Kristine Miller is a 53 y.o. female with a past medical history of obesity, multiple sclerosis diagnosed in 2014, who was in her usual state of health over the past 1 week when she started experiencing generalized weakness. She has been out cutting grass in the heat and then yesterday she presented to the ED with complaints of chest pain, but was also experiencing left-sided numbness including her head, neck, left arm and left leg. She was given high dose of steroids in the ED, and was lost to followup with her neurologist. Over the course of the night the symptoms worsened and this morning when she woke up she felt her left legs to be extremely weak and couldn't walk as a result. She mentions that she was diagnosed with MS in October of 2014. She is not taking any immune therapy for the same nor is she taking any other medications. As a result of the MS she has noticed changes in her speech and has also noticed some difficulty with swallowing solid foods. She denies any fever or chills recently. He does admit exposure to heat in the last couple days. Denies any dysuria, hematuria. No shortness of breath. She had chest pain yesterday, but none currently. She feels, as if her heart is racing. She describes the left leg has been very heavy. She also has had left-sided headache for the last 2, days. Denies any recent changes to her medications. She called her neurologist and was asked to come in to the emergency department and be admitted.  Home Medications: Prior to Admission medications   Medication Sig Start Date End Date Taking? Authorizing Provider  aspirin EC 81 MG tablet Take 162 mg by mouth once as needed for moderate pain.   Yes  Historical Provider, MD  aspirin-acetaminophen-caffeine (EXCEDRIN MIGRAINE) [phone] MG per tablet Take 2 tablets by mouth every 6 (six) hours as needed for headache.   Yes Historical Provider, MD  calcium-vitamin D (OSCAL WITH D) 500-200 MG-UNIT per tablet Take 1 tablet by mouth every morning.   Yes Historical Provider, MD    Allergies:  Allergies  Allergen Reactions  . Morphine And Related Swelling  . Penicillins Nausea And Vomiting    Past Medical History: Past Medical History  Diagnosis Date  . Rotator cuff arthropathy, left   . IBS (irritable bowel syndrome)   . Pneumonia   . Asthma   . Palpitations   . Anxiety   . MS (multiple sclerosis)   . Chronic headaches     Past Surgical History  Procedure Laterality Date  . Abdominal hysterectomy    . Rotator cuff repair    . Cholecystectomy    . Appendectomy    . Cesarean section      Social History: Lives with her husband and children in [place]. No smoking, alcohol use or illicit drug use. Typically, she is independent with daily activities.  Family History:  Family History  Problem Relation Age of Onset  . Cancer Mother   . Diabetes Mother   . Diabetes type I Daughter      Review of Systems - History obtained from the patient General ROS: positive for  - fatigue Psychological ROS: positive for - anxiety Ophthalmic ROS: occasional blurring of  vision ENT ROS: negative Allergy and Immunology ROS: negative Hematological and Lymphatic ROS: negative Endocrine ROS: negative Respiratory ROS: no cough, shortness of breath, or wheezing Cardiovascular ROS: no chest pain or dyspnea on exertion Gastrointestinal ROS: no abdominal pain, change in bowel habits, or black or bloody stools Genito-Urinary ROS: no dysuria, trouble voiding, or hematuria Musculoskeletal ROS: negative Neurological ROS: as in hpi Dermatological ROS: negative  Physical Examination  Filed Vitals:   04/28/14 1919 04/28/14 1930 04/28/14 1936  04/28/14 1939  BP: 143/76 142/76 138/80   Pulse: 98 98 103 103  Temp:      TempSrc:      Resp: 18     Weight:      SpO2: 94% 91% 94% 94%    BP 138/80  Pulse 103  Temp(Src) 98.1 F (36.7 C) (Oral)  Resp 18  Wt 102.513 kg (226 lb)  SpO2 94%  General appearance: alert, cooperative, appears stated age, no distress and moderately obese Head: Normocephalic, without obvious abnormality, atraumatic Eyes: conjunctivae/corneas clear. PERRL, EOM's intact.  Throat: lips, mucosa, and tongue normal; teeth and gums normal Neck: no adenopathy, no carotid bruit, no JVD, supple, symmetrical, trachea midline and thyroid not enlarged, symmetric, no tenderness/mass/nodules Resp: clear to auscultation bilaterally Cardio: regular rate and rhythm, S1, S2 normal, no murmur, click, rub or gallop GI: soft, non-tender; bowel sounds normal; no masses,  no organomegaly Extremities: extremities normal, atraumatic, no cyanosis or edema Pulses: 2+ and symmetric Skin: Skin color, texture, turgor normal. No rashes or lesions Lymph nodes: Cervical, supraclavicular, and axillary nodes normal. Neurologic: She is alert and oriented x3. Cranial nerves are intact 2-12. Motor strength is 5/5, upper and lower extremity on the right. 4 of 5 left upper extremity. 3/5 left lower extremity. Reflexes are slightly more brisk on the left side. Gait was not assessed.  Laboratory Data: Results for orders placed during the hospital encounter of 04/28/14 (from the past 48 hour(s))  CBC WITH DIFFERENTIAL     Status: Abnormal   Collection Time    04/28/14  4:31 PM      Result Value Ref Range   WBC 16.7 (*) 4.0 - 10.5 K/uL   RBC 5.08  3.87 - 5.11 MIL/uL   Hemoglobin 15.3 (*) 12.0 - 15.0 g/dL   HCT 43.6  36.0 - 46.0 %   MCV 85.8  78.0 - 100.0 fL   MCH 30.1  26.0 - 34.0 pg   MCHC 35.1  30.0 - 36.0 g/dL   RDW 13.4  11.5 - 15.5 %   Platelets 372  150 - 400 K/uL   Neutrophils Relative % 92 (*) 43 - 77 %   Neutro Abs 15.3 (*) 1.7  - 7.7 K/uL   Lymphocytes Relative 7 (*) 12 - 46 %   Lymphs Abs 1.2  0.7 - 4.0 K/uL   Monocytes Relative 1 (*) 3 - 12 %   Monocytes Absolute 0.1  0.1 - 1.0 K/uL   Eosinophils Relative 0  0 - 5 %   Eosinophils Absolute 0.0  0.0 - 0.7 K/uL   Basophils Relative 0  0 - 1 %   Basophils Absolute 0.0  0.0 - 0.1 K/uL  BASIC METABOLIC PANEL     Status: Abnormal   Collection Time    04/28/14  4:31 PM      Result Value Ref Range   Sodium 142  137 - 147 mEq/L   Potassium 4.2  3.7 - 5.3 mEq/L   Chloride 102  96 -  112 mEq/L   CO2 23  19 - 32 mEq/L   Glucose, Bld 166 (*) 70 - 99 mg/dL   BUN 10  6 - 23 mg/dL   Creatinine, Ser 0.79  0.50 - 1.10 mg/dL   Calcium 10.0  8.4 - 10.5 mg/dL   GFR calc non Af Amer >90  >90 mL/min   GFR calc Af Amer >90  >90 mL/min   Comment: (NOTE)     The eGFR has been calculated using the CKD EPI equation.     This calculation has not been validated in all clinical situations.     eGFR's persistently <90 mL/min signify possible Chronic Kidney     Disease.    Radiology Reports: Ct Head Wo Contrast  04/27/2014   CLINICAL DATA:  Multiple sclerosis. Numbness in the left side of the face. History of multiple sclerosis.  EXAM: CT HEAD WITHOUT CONTRAST  TECHNIQUE: Contiguous axial images were obtained from the base of the skull through the vertex without intravenous contrast.  COMPARISON:  CT head 08/19/2013.  MRI brain 08/19/2013.  FINDINGS: No mass lesion, mass effect, midline shift, hydrocephalus, hemorrhage. No territorial ischemia or acute infarction. Scattered areas of periventricular low attenuation are present compatible with stated clinical history of multiple sclerosis and correlating with white matter lesions identified on prior examination.  IMPRESSION: No acute intracranial abnormality. Scattered periventricular white-matter lesion compatible with history of multiple sclerosis.   Electronically Signed   By: Dereck Ligas M.D.   On: 04/27/2014 22:03   Mr Jeri Cos NO  Contrast  04/28/2014   CLINICAL DATA:  Multiple sclerosis. Woke up with left leg weakness and inability to bear weight.  EXAM: MRI HEAD WITHOUT AND WITH CONTRAST  TECHNIQUE: Multiplanar, multiecho pulse sequences of the brain and surrounding structures were obtained without and with intravenous contrast.  CONTRAST:  59m MULTIHANCE GADOBENATE DIMEGLUMINE 529 MG/ML IV SOLN  COMPARISON:  MRI brain 08/19/2013. MRI cervical spine today reported separately.  FINDINGS: No evidence for acute infarction, hemorrhage, mass lesion, hydrocephalus, or extra-axial fluid. Normal for age cerebral volume. No large vessel or lacunar infarcts.  No diffusion abnormality or postcontrast enhancement to suggest an acute white matter lesion. There are scattered subcortical and periventricular signal abnormalities, largely subcentimeter in size, scattered throughout the supratentorial region, sparing the brainstem and cerebellum. No convincing involvement of the callosal-septal interface. No new lesions since October 2014.  Pituitary, pineal, and cerebellar tonsils unremarkable. No upper cervical lesions. Flow voids are maintained throughout the carotid, basilar, and vertebral arteries. There are no areas of chronic hemorrhage. Visualized calvarium, skull base, and upper cervical osseous structures unremarkable. Scalp and extracranial soft tissues, orbits, sinuses, and mastoids show no acute process. Incidental sebaceous cyst in the frontal scalp.  IMPRESSION: Stable nonspecific white matter signal abnormality. In the appropriate clinical setting these could be consistent with the diagnosis of multiple sclerosis, but there are no worrisome features, such as diffusion abnormality or postcontrast enhancement on today's exam, and no definite progression since 2014.   Electronically Signed   By: JRolla FlattenM.D.   On: 04/28/2014 19:17   Mr Cervical Spine W Wo Contrast  04/28/2014   CLINICAL DATA:  Diagnosed with MS. along with left leg  weakness.  EXAM: MRI CERVICAL SPINE WITHOUT AND WITH CONTRAST  TECHNIQUE: Multiplanar and multiecho pulse sequences of the cervical spine, to include the craniocervical junction and cervicothoracic junction, were obtained according to standard protocol without and with intravenous contrast.  CONTRAST:  MultiHance 20 mL.  COMPARISON:  None.  FINDINGS: No evidence for disc degeneration, disc herniation, vertebral body abnormality or paraspinous mass.  Cord is normal size and signal throughout the cervical spine. Negative visualized upper thoracic spinal cord.  No osseous findings. Cervicomedullary junction within normal limits. No abnormal enhancement postcontrast.  Unchanged appearance from prior normal study.  IMPRESSION: Negative cervical spine MRI without and with contrast.   Electronically Signed   By: Rolla Flatten M.D.   On: 04/28/2014 19:34    Electrocardiogram: None done today. EKG from yesterday did not show any obvious ischemic changes, although it was a poor quality EKG.  Problem List  Active Problems:   Obesity   Anxiety state, unspecified   Hemiplegia affecting left nondominant side   MS (multiple sclerosis)   Multiple sclerosis exacerbation   Assessment: This is a 53 year old, obese, white female, who comes in with left-sided numbness and weakness. The symptoms predominately effect her left leg. This is most likely due to her multiple sclerosis.  Plan: #1 left hemiplegia (specifically, left leg weakness) secondary to exacerbation of multiple sclerosis: She has been given another dose of high-dose steroid this evening. We will consult Dr. [name] in the morning to provide his input regarding further management. Physical and occupational therapy will be consulted. MRI of the cervical spine does not show any lesions in that area. MR of the brain,do show stable lesions.  #2 speech and swallowing difficulties: This is all likely due to her MS. She denies any difficulty with swallowing  liquids. Has difficulty with large tablets. We will get swallow evaluation.  #3 headaches: Medications as needed will be prescribed. Probably related to [place] as well.  #4 hyperglycemia: Most likely due to the high-dose steroids she received yesterday. We will monitor her blood sugars. Check HbA1c in the morning.  #5 leukocytosis: Also, secondary to steroids. Check a UA. She is afebrile.  DVT Prophylaxis: Lovenox Code Status: Full code Family Communication: Discussed with the patient and her family in detail  Disposition Plan: Admit to MedSurg   Further management decisions will depend on results of further testing and patient's response to treatment.   [facility]  [facility] Pager [phone]  If 7PM-7AM, please contact night-coverage [email or web address] Password [facility]  04/28/2014, 7:59 PM  Disclaimer: This note was dictated with voice recognition software. Similar sounding words can inadvertently be transcribed and may not be corrected upon review.

## 2014-04-28 NOTE — ED Notes (Addendum)
Sent by Dr. For worsening MS symptoms, left side weak and numb, seen last night in ED, in wheelchair

## 2014-04-29 DIAGNOSIS — R262 Difficulty in walking, not elsewhere classified: Secondary | ICD-10-CM

## 2014-04-29 DIAGNOSIS — R5383 Other fatigue: Secondary | ICD-10-CM

## 2014-04-29 DIAGNOSIS — R5381 Other malaise: Secondary | ICD-10-CM

## 2014-04-29 LAB — CBC
HEMATOCRIT: 41.1 % (ref 36.0–46.0)
Hemoglobin: 13.8 g/dL (ref 12.0–15.0)
MCH: 29.6 pg (ref 26.0–34.0)
MCHC: 33.6 g/dL (ref 30.0–36.0)
MCV: 88.2 fL (ref 78.0–100.0)
Platelets: 375 10*3/uL (ref 150–400)
RBC: 4.66 MIL/uL (ref 3.87–5.11)
RDW: 13.8 % (ref 11.5–15.5)
WBC: 20.1 10*3/uL — ABNORMAL HIGH (ref 4.0–10.5)

## 2014-04-29 LAB — COMPREHENSIVE METABOLIC PANEL
ALBUMIN: 3.3 g/dL — AB (ref 3.5–5.2)
ALT: 12 U/L (ref 0–35)
AST: 12 U/L (ref 0–37)
Alkaline Phosphatase: 76 U/L (ref 39–117)
BUN: 13 mg/dL (ref 6–23)
CALCIUM: 9.4 mg/dL (ref 8.4–10.5)
CO2: 26 mEq/L (ref 19–32)
CREATININE: 0.8 mg/dL (ref 0.50–1.10)
Chloride: 107 mEq/L (ref 96–112)
GFR calc Af Amer: >90 mL/min (ref >90)
GFR calc non Af Amer: 84 mL/min — ABNORMAL LOW (ref >90)
Glucose, Bld: 195 mg/dL — ABNORMAL HIGH (ref 70–99)
Potassium: 4.8 mEq/L (ref 3.7–5.3)
Sodium: 145 mEq/L (ref 137–147)
TOTAL PROTEIN: 6.5 g/dL (ref 6.0–8.3)
Total Bilirubin: <0.2 mg/dL — ABNORMAL LOW (ref 0.3–1.2)

## 2014-04-29 LAB — GLUCOSE, CAPILLARY
GLUCOSE-CAPILLARY: 137 mg/dL — AB (ref 70–99)
Glucose-Capillary: 124 mg/dL — ABNORMAL HIGH (ref 70–99)
Glucose-Capillary: 165 mg/dL — ABNORMAL HIGH (ref 70–99)
Glucose-Capillary: 167 mg/dL — ABNORMAL HIGH (ref 70–99)
Glucose-Capillary: 171 mg/dL — ABNORMAL HIGH (ref 70–99)

## 2014-04-29 LAB — URINALYSIS, ROUTINE W REFLEX MICROSCOPIC
BILIRUBIN URINE: NEGATIVE
Glucose, UA: NEGATIVE mg/dL
KETONES UR: NEGATIVE mg/dL
Leukocytes, UA: NEGATIVE
NITRITE: NEGATIVE
Protein, ur: NEGATIVE mg/dL
Specific Gravity, Urine: 1.02 (ref 1.005–1.030)
Urobilinogen, UA: 0.2 mg/dL (ref 0.0–1.0)
pH: 5.5 (ref 5.0–8.0)

## 2014-04-29 LAB — URINE MICROSCOPIC-ADD ON

## 2014-04-29 LAB — HEMOGLOBIN A1C
HEMOGLOBIN A1C: 5.7 % — AB (ref <5.7)
MEAN PLASMA GLUCOSE: 117 mg/dL — AB (ref <117)

## 2014-04-29 MED ORDER — SODIUM CHLORIDE 0.9 % IV SOLN
1000.0000 mg | Freq: Every day | INTRAVENOUS | Status: DC
  Administered 2014-04-29 – 2014-05-02 (×4): 1000 mg via INTRAVENOUS
  Filled 2014-04-29 (×7): qty 8

## 2014-04-29 MED ORDER — ENOXAPARIN SODIUM 60 MG/0.6ML ~~LOC~~ SOLN
50.0000 mg | SUBCUTANEOUS | Status: DC
  Administered 2014-04-29 – 2014-05-01 (×3): 50 mg via SUBCUTANEOUS
  Filled 2014-04-29 (×3): qty 0.6

## 2014-04-29 NOTE — Progress Notes (Signed)
[facility] PROGRESS NOTE  Kristine Miller YKZ:[id number] DOB: 07-21-1961 DOA: 04/28/2014 PCP: [name], MD  Interim summary: Kristine Miller is a 53 y.o. female with a past medical history of obesity, multiple sclerosis diagnosed in 2014, who was in her usual state of health over the past 1 week when she started experiencing generalized weakness. She has been out cutting grass in the heat and then yesterday she presented to the ED with complaints of chest pain, but was also experiencing left-sided numbness including her head, neck, left arm and left leg. She was referred to medical service for admission for MS exacerbation. She was started on IV steroids and neurology consulted. PT eval also consulted.   Assessment/Plan: 1. LEFT sided weakness, : possibly secondary to MS exacerbation.  Started on IV steroids and her symptoms seem to be improving. MRI Brain ordered and show stable lesions.   2. Swallowing difficulties: - pt does not report any swallow problems except for meds.  3. Headaches: improving.   4. Hyperglycemia: hgba1c is 5.7.  -  CBG (last 3)   Recent Labs  04/29/14 0721 04/29/14 1131 04/29/14 1824  GLUCAP 167* 137* 171*    5. Leukocytosis: - possibly from steroids. Monitor in am.    Code Status: full code.  Family Communication: discussed in detail with pt's husband at bedside. Disposition Plan: remain inpatient, pending PT eval.   Consultants:  neurology  Procedures: MRI BRAIN Antibiotics:  none  HPI/Subjective: Reports left sided headache, left sided numbness.   Objective: Filed Vitals:   04/29/14 1348  BP: 144/74  Pulse: 78  Temp: 98.7 F (37.1 C)  Resp: 20    Intake/Output Summary (Last 24 hours) at 04/29/14 1824 Last data filed at 04/29/14 1236  Gross per 24 hour  Intake 1379.25 ml  Output    500 ml  Net 879.25 ml   Filed Weights   04/28/14 1556 04/28/14 2300  Weight: 102.513 kg (226 lb) 102.7 kg (226 lb 6.6 oz)     Exam:   General:  Alert afebrile comfortable  Cardiovascular: s1s2  Respiratory: ctab  Abdomen: soft NT ND BS+  Musculoskeletal: no pedal edema.   NEURO: decreased sensation on the left side.  Data Reviewed: Basic Metabolic Panel:  Recent Labs Lab 04/27/14 2051 04/28/14 1631 04/29/14 0554  NA 144 142 145  K 4.0 4.2 4.8  CL 105 102 107  CO2 25 23 26   GLUCOSE 104* 166* 195*  BUN 13 10 13   CREATININE 0.90 0.79 0.80  CALCIUM 9.2 10.0 9.4   Liver Function Tests:  Recent Labs Lab 04/27/14 2051 04/29/14 0554  AST 15 12  ALT 13 12  ALKPHOS 82 76  BILITOT 0.2* <0.2*  PROT 7.0 6.5  ALBUMIN 3.6 3.3*   No results found for this basename: LIPASE, AMYLASE,  in the last 168 hours No results found for this basename: AMMONIA,  in the last 168 hours CBC:  Recent Labs Lab 04/27/14 2051 04/28/14 1631 04/29/14 0554  WBC 10.9* 16.7* 20.1*  NEUTROABS 6.4 15.3*  --   HGB 14.2 15.3* 13.8  HCT 41.7 43.6 41.1  MCV 86.5 85.8 88.2  PLT 334 372 375   Cardiac Enzymes:  Recent Labs Lab 04/27/14 2051  TROPONINI <0.30   BNP (last 3 results) No results found for this basename: PROBNP,  in the last 8760 hours CBG:  Recent Labs Lab 04/29/14 04/29/14 0721 04/29/14 1131  GLUCAP 165* 167* 137*    No results found for this or  any previous visit (from the past 240 hour(s)).   Studies: Ct Head Wo Contrast  04/27/2014   CLINICAL DATA:  Multiple sclerosis. Numbness in the left side of the face. History of multiple sclerosis.  EXAM: CT HEAD WITHOUT CONTRAST  TECHNIQUE: Contiguous axial images were obtained from the base of the skull through the vertex without intravenous contrast.  COMPARISON:  CT head 08/19/2013.  MRI brain 08/19/2013.  FINDINGS: No mass lesion, mass effect, midline shift, hydrocephalus, hemorrhage. No territorial ischemia or acute infarction. Scattered areas of periventricular low attenuation are present compatible with stated clinical history of multiple  sclerosis and correlating with white matter lesions identified on prior examination.  IMPRESSION: No acute intracranial abnormality. Scattered periventricular white-matter lesion compatible with history of multiple sclerosis.   Electronically Signed   By: Dereck Ligas M.D.   On: 04/27/2014 22:03   Mr Jeri Cos TF Contrast  04/28/2014   CLINICAL DATA:  Multiple sclerosis. Woke up with left leg weakness and inability to bear weight.  EXAM: MRI HEAD WITHOUT AND WITH CONTRAST  TECHNIQUE: Multiplanar, multiecho pulse sequences of the brain and surrounding structures were obtained without and with intravenous contrast.  CONTRAST:  8mL MULTIHANCE GADOBENATE DIMEGLUMINE 529 MG/ML IV SOLN  COMPARISON:  MRI brain 08/19/2013. MRI cervical spine today reported separately.  FINDINGS: No evidence for acute infarction, hemorrhage, mass lesion, hydrocephalus, or extra-axial fluid. Normal for age cerebral volume. No large vessel or lacunar infarcts.  No diffusion abnormality or postcontrast enhancement to suggest an acute white matter lesion. There are scattered subcortical and periventricular signal abnormalities, largely subcentimeter in size, scattered throughout the supratentorial region, sparing the brainstem and cerebellum. No convincing involvement of the callosal-septal interface. No new lesions since October 2014.  Pituitary, pineal, and cerebellar tonsils unremarkable. No upper cervical lesions. Flow voids are maintained throughout the carotid, basilar, and vertebral arteries. There are no areas of chronic hemorrhage. Visualized calvarium, skull base, and upper cervical osseous structures unremarkable. Scalp and extracranial soft tissues, orbits, sinuses, and mastoids show no acute process. Incidental sebaceous cyst in the frontal scalp.  IMPRESSION: Stable nonspecific white matter signal abnormality. In the appropriate clinical setting these could be consistent with the diagnosis of multiple sclerosis, but there are  no worrisome features, such as diffusion abnormality or postcontrast enhancement on today's exam, and no definite progression since 2014.   Electronically Signed   By: Rolla Flatten M.D.   On: 04/28/2014 19:17   Mr Cervical Spine W Wo Contrast  04/28/2014   CLINICAL DATA:  Diagnosed with MS. along with left leg weakness.  EXAM: MRI CERVICAL SPINE WITHOUT AND WITH CONTRAST  TECHNIQUE: Multiplanar and multiecho pulse sequences of the cervical spine, to include the craniocervical junction and cervicothoracic junction, were obtained according to standard protocol without and with intravenous contrast.  CONTRAST:  MultiHance 20 mL.  COMPARISON:  None.  FINDINGS: No evidence for disc degeneration, disc herniation, vertebral body abnormality or paraspinous mass.  Cord is normal size and signal throughout the cervical spine. Negative visualized upper thoracic spinal cord.  No osseous findings. Cervicomedullary junction within normal limits. No abnormal enhancement postcontrast.  Unchanged appearance from prior normal study.  IMPRESSION: Negative cervical spine MRI without and with contrast.   Electronically Signed   By: Rolla Flatten M.D.   On: 04/28/2014 19:34    Scheduled Meds: . enoxaparin (LOVENOX) injection  50 mg Subcutaneous Q24H  . insulin aspart  0-15 Units Subcutaneous TID WC  . insulin aspart  0-5 Units  Subcutaneous QHS  . methylPREDNISolone (SOLU-MEDROL) injection  1,000 mg Intravenous Daily   Continuous Infusions:   Active Problems:   Obesity   Anxiety state, unspecified   Hemiplegia affecting left nondominant side   MS (multiple sclerosis)   Multiple sclerosis exacerbation    Time spent: 30 MINUTES.    [facility] Pager [phone]. If 7PM-7AM, please contact night-coverage at [email or web address], password [facility] 04/29/2014, 6:24 PM  LOS: 1 day

## 2014-04-29 NOTE — Evaluation (Signed)
Clinical/Bedside Swallow Evaluation  Patient Details  Name: Kristine Miller MRN: [id number] Date of Birth: Apr 26, 1961  Today's Date: 04/29/2014 Time: 9735-3299 SLP Time Calculation (min): 25 min  Past Medical History:  Past Medical History  Diagnosis Date  . Rotator cuff arthropathy, left   . IBS (irritable bowel syndrome)   . Pneumonia   . Asthma   . Palpitations   . Anxiety   . MS (multiple sclerosis)   . Chronic headaches    Past Surgical History:  Past Surgical History  Procedure Laterality Date  . Abdominal hysterectomy    . Rotator cuff repair    . Cholecystectomy    . Appendectomy    . Cesarean section     HPI:  Kristine Miller is a 53 y.o. female with a past medical history of obesity, multiple sclerosis diagnosed in 2014, who was in her usual state of health over the past 1 week when she started experiencing generalized weakness. She has been out cutting grass in the heat and then yesterday she presented to the ED with complaints of chest pain, but was also experiencing left-sided numbness including her head, neck, left arm and left leg. She was given high dose of steroids in the ED, and was left to followup with her neurologist, Dr. [name]. Over the course of the night the symptoms worsened and this morning when she woke up she felt her left legs to be extremely weak and couldn't walk as a result. She is not taking any immune therapy for the same nor is she taking any other medications. As a result of the MS she has noticed changes in her speech and has also noticed some difficulty with swallowing solid foods. She denies any fever or chills recently. She does admit exposure to heat in the last couple days. Denies any dysuria, hematuria. No shortness of breath. She had chest pain yesterday, but none currently. She feels, as if her heart is racing. She describes the left leg has been very heavy. She also has had left-sided headache for the last 2, days. Denies any recent  changes to her medications. She called her neurologist and was asked to come in to the emergency department and be admitted.   Assessment / Plan / Recommendation Clinical Impression  Kristine Miller reports that she was diagnosed with MS in October, but does not take any medications for it ("besides essential oils and prayer"). She reports that she has always had a difficult time swallowing pills (since childhood), but that she has noted increased difficulty in the last few months. She also reports that occasionally "things go down the wrong pipe" and she coughs. Occasionally she has difficulty verbalizing what she is thinking, but reports that it usually comes to her if she slows down. 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 She cleared her throat x1 during straw sips of thin water and tolerated graham cracker without incident (although appears somewhat labored). Pt was encouraged to take small sips with head in neutral to down position due to suspected premature spillage of thin liquids into pharynx at times. Pt acknowledges that she is more vigilant when she eats/drinks now so that she does not get choked. OK to upgrade to regular textures and pt independent with use of compensatory strategies (small sips, sit upright). Pt can take pills whole (break large pills in half if able), but may benefit from liquid and food wash  to help clear. This is what she typically does at home. No further SLP follow up indicated at this time.     Aspiration Risk  Mild    Diet Recommendation Regular;Thin liquid   Liquid Administration via: Cup;Straw Medication Administration: Whole meds with liquid (break large pills, follow with food) Supervision: Patient able to self feed Compensations: Small sips/bites Postural Changes and/or Swallow Maneuvers: Seated upright 90 degrees;Upright 30-60 min after meal    Other  Recommendations Oral  Care Recommendations: Oral care BID;Patient independent with oral care Other Recommendations: Clarify dietary restrictions   Follow Up Recommendations  None    Frequency and Duration        Pertinent Vitals/Pain VSS      Swallow Study Prior Functional Status   Lives at home with husband and child    General Date of Onset: 04/27/14 HPI: Kristine Miller is a 53 y.o. female with a past medical history of obesity, multiple sclerosis diagnosed in 2014, who was in her usual state of health over the past 1 week when she started experiencing generalized weakness. She has been out cutting grass in the heat and then yesterday she presented to the ED with complaints of chest pain, but was also experiencing left-sided numbness including her head, neck, left arm and left leg. She was given high dose of steroids in the ED, and was left to followup with her neurologist, Dr. [name]. Over the course of the night the symptoms worsened and this morning when she woke up she felt her left legs to be extremely weak and couldn't walk as a result. She is not taking any immune therapy for the same nor is she taking any other medications. As a result of the MS she has noticed changes in her speech and has also noticed some difficulty with swallowing solid foods. She denies any fever or chills recently. She does admit exposure to heat in the last couple days. Denies any dysuria, hematuria. No shortness of breath. She had chest pain yesterday, but none currently. She feels, as if her heart is racing. She describes the left leg has been very heavy. She also has had left-sided headache for the last 2, days. Denies any recent changes to her medications. She called her neurologist and was asked to come in to the emergency department and be admitted. Type of Study: Bedside swallow evaluation Previous Swallow Assessment: None on record Diet Prior to this Study: Dysphagia 2 (chopped);Thin liquids Temperature Spikes Noted:  No Respiratory Status: Room air History of Recent Intubation: No Behavior/Cognition: Alert;Cooperative;Pleasant mood Oral Cavity - Dentition: Adequate natural dentition Self-Feeding Abilities: Able to feed self Patient Positioning: Upright in bed Baseline Vocal Quality: Clear Volitional Cough: Strong Volitional Swallow: Able to elicit    Oral/Motor/Sensory Function Overall Oral Motor/Sensory Function: Appears within functional limits for tasks assessed   Ice Chips Ice chips: Not tested   Thin Liquid Thin Liquid: Within functional limits (throat clear x1) Presentation: Cup;Self Fed;Straw    Nectar Thick Nectar Thick Liquid: Not tested   Honey Thick Honey Thick Liquid: Not tested   Puree Puree: Not tested   Solid       Solid: Within functional limits Presentation: Self Fed      Thank you,  Genene Churn, [place]  PORTER,DABNEY 04/29/2014,3:27 PM

## 2014-04-29 NOTE — Consult Note (Signed)
[place] A. [name], MD     [email or web address]          Kristine Miller is an 53 y.o. female.   ASSESSMENT/PLAN: 1. Acute relapse/exacerbation of multiple sclerosis. We will go ahead and complete a 5 day course of IV Solu-Medrol high dose. She has gone 2 doses already and will be given 3 additional doses. Physical therapy will be consulted for gait training. We need to address issue immunomodulating therapy but she is currently on insuring. We will still see if funding can be arranged.   2. Hyperglycemia suspected to be due to high dose Solu-Medrol. Continue with sliding scale.  3. Obesity.  This is a 53 year old white female who was diagnosed with multiple sclerosis last year. She was given high-dose Solu-Medrol but has been lost to follow-up mostly because of not being insured. She has never been started on immunomodulating therapies. The patient has had some chronic recurrent symptoms on a daily basis including fatigue, vertigo and patchy paresthesias that are fleeting. The patient had been out in the sun working, and her grafts which developed significant pain, numbness and paresthesias involving the Left thoracic region, Left upper extremity, Left facial region extending into the left lower extremity. The patient presented to the hospital and the was given a single dose of high-dose Solu-Medrol. She was to come to the office to receive 2 additional doses. Unfortunately, she progressed and developed severe left leg weakness to the point that she was unable to ambulate. She subsequently told to come to the emergency room for further evaluation. She was given a second dose of Solu-Medrol last night. She still is having a lot of for symptoms on the left side. Review of systems is otherwise negative.  GENERAL: This very pleasant obese female in no acute distress.  HEENT: Supple. Atraumatic normocephalic.   ABDOMEN: soft  EXTREMITIES: No edema   BACK: Normal.  SKIN:  Normal by inspection.    MENTAL STATUS: Alert and oriented. Speech, language and cognition are generally intact. Judgment and insight normal.   CRANIAL NERVES: Pupils are equal, round and reactive to light and accommodation; extra ocular movements are full, there is no significant nystagmus; visual fields are full; upper and lower facial muscles are normal in strength and symmetric, there is no flattening of the nasolabial folds; tongue is midline; uvula is midline; shoulder elevation is normal.  MOTOR: Normal tone, bulk and strength- Right upper and lower extremities; no pronator drift. Left upper extremity: Deltoid 4 minus/5, triceps 5 and handgrip 5. Left lower extremity: Hip flexion 2-3/5 And dorsiflexion 4/5.  COORDINATION: Left finger to nose is normal, right finger to nose is normal, No rest tremor; no intention tremor; no postural tremor; no bradykinesia.  REFLEXES: Deep tendon reflexes are symmetrical and normal.  SENSATION: Reduced sensation involving the entire left Hemibody, neck and facial region.      Past Medical History  Diagnosis Date  . Rotator cuff arthropathy, left   . IBS (irritable bowel syndrome)   . Pneumonia   . Asthma   . Palpitations   . Anxiety   . MS (multiple sclerosis)   . Chronic headaches     Past Surgical History  Procedure Laterality Date  . Abdominal hysterectomy    . Rotator cuff repair    . Cholecystectomy    . Appendectomy    . Cesarean section      Family History  Problem Relation Age of Onset  . Cancer Mother   . Diabetes  Mother   . Diabetes type I Daughter     Social History:  reports that she has never smoked. She has never used smokeless tobacco. She reports that she does not drink alcohol or use illicit drugs.  Allergies:  Allergies  Allergen Reactions  . Morphine And Related Swelling  . Penicillins Nausea And Vomiting    Medications: Prior to Admission medications   Medication Sig Start Date End Date Taking?  Authorizing Provider  aspirin EC 81 MG tablet Take 162 mg by mouth once as needed for moderate pain.   Yes Historical Provider, MD  aspirin-acetaminophen-caffeine (EXCEDRIN MIGRAINE) [phone] MG per tablet Take 2 tablets by mouth every 6 (six) hours as needed for headache.   Yes Historical Provider, MD  calcium-vitamin D (OSCAL WITH D) 500-200 MG-UNIT per tablet Take 1 tablet by mouth every morning.   Yes Historical Provider, MD    Scheduled Meds: . enoxaparin (LOVENOX) injection  50 mg Subcutaneous Q24H  . insulin aspart  0-15 Units Subcutaneous TID WC  . insulin aspart  0-5 Units Subcutaneous QHS   Continuous Infusions: . sodium chloride 75 mL/hr at 04/28/14 2329   PRN Meds:.acetaminophen, acetaminophen, albuterol, aspirin-acetaminophen-caffeine, HYDROcodone-acetaminophen, LORazepam, ondansetron (ZOFRAN) IV, ondansetron   Blood pressure 126/74, pulse 79, temperature 97.8 F (36.6 C), temperature source Oral, resp. rate 18, height 5' (1.524 m), weight 102.7 kg (226 lb 6.6 oz), SpO2 92.00%.   Results for orders placed during the hospital encounter of 04/28/14 (from the past 48 hour(s))  CBC WITH DIFFERENTIAL     Status: Abnormal   Collection Time    04/28/14  4:31 PM      Result Value Ref Range   WBC 16.7 (*) 4.0 - 10.5 K/uL   RBC 5.08  3.87 - 5.11 MIL/uL   Hemoglobin 15.3 (*) 12.0 - 15.0 g/dL   HCT 43.6  36.0 - 46.0 %   MCV 85.8  78.0 - 100.0 fL   MCH 30.1  26.0 - 34.0 pg   MCHC 35.1  30.0 - 36.0 g/dL   RDW 13.4  11.5 - 15.5 %   Platelets 372  150 - 400 K/uL   Neutrophils Relative % 92 (*) 43 - 77 %   Neutro Abs 15.3 (*) 1.7 - 7.7 K/uL   Lymphocytes Relative 7 (*) 12 - 46 %   Lymphs Abs 1.2  0.7 - 4.0 K/uL   Monocytes Relative 1 (*) 3 - 12 %   Monocytes Absolute 0.1  0.1 - 1.0 K/uL   Eosinophils Relative 0  0 - 5 %   Eosinophils Absolute 0.0  0.0 - 0.7 K/uL   Basophils Relative 0  0 - 1 %   Basophils Absolute 0.0  0.0 - 0.1 K/uL  BASIC METABOLIC PANEL     Status:  Abnormal   Collection Time    04/28/14  4:31 PM      Result Value Ref Range   Sodium 142  137 - 147 mEq/L   Potassium 4.2  3.7 - 5.3 mEq/L   Chloride 102  96 - 112 mEq/L   CO2 23  19 - 32 mEq/L   Glucose, Bld 166 (*) 70 - 99 mg/dL   BUN 10  6 - 23 mg/dL   Creatinine, Ser 0.79  0.50 - 1.10 mg/dL   Calcium 10.0  8.4 - 10.5 mg/dL   GFR calc non Af Amer >90  >90 mL/min   GFR calc Af Amer >90  >90 mL/min   Comment: (NOTE)  The eGFR has been calculated using the CKD EPI equation.     This calculation has not been validated in all clinical situations.     eGFR's persistently <90 mL/min signify possible Chronic Kidney     Disease.  GLUCOSE, CAPILLARY     Status: Abnormal   Collection Time    04/29/14 12:00 AM      Result Value Ref Range   Glucose-Capillary 165 (*) 70 - 99 mg/dL   Comment 1 Documented in Chart     Comment 2 Notify RN    CBC     Status: Abnormal   Collection Time    04/29/14  5:54 AM      Result Value Ref Range   WBC 20.1 (*) 4.0 - 10.5 K/uL   RBC 4.66  3.87 - 5.11 MIL/uL   Hemoglobin 13.8  12.0 - 15.0 g/dL   HCT 41.1  36.0 - 46.0 %   MCV 88.2  78.0 - 100.0 fL   MCH 29.6  26.0 - 34.0 pg   MCHC 33.6  30.0 - 36.0 g/dL   RDW 13.8  11.5 - 15.5 %   Platelets 375  150 - 400 K/uL  COMPREHENSIVE METABOLIC PANEL     Status: Abnormal   Collection Time    04/29/14  5:54 AM      Result Value Ref Range   Sodium 145  137 - 147 mEq/L   Potassium 4.8  3.7 - 5.3 mEq/L   Chloride 107  96 - 112 mEq/L   CO2 26  19 - 32 mEq/L   Glucose, Bld 195 (*) 70 - 99 mg/dL   BUN 13  6 - 23 mg/dL   Creatinine, Ser 0.80  0.50 - 1.10 mg/dL   Calcium 9.4  8.4 - 10.5 mg/dL   Total Protein 6.5  6.0 - 8.3 g/dL   Albumin 3.3 (*) 3.5 - 5.2 g/dL   AST 12  0 - 37 U/L   ALT 12  0 - 35 U/L   Alkaline Phosphatase 76  39 - 117 U/L   Total Bilirubin <0.2 (*) 0.3 - 1.2 mg/dL   GFR calc non Af Amer 84 (*) >90 mL/min   GFR calc Af Amer >90  >90 mL/min   Comment: (NOTE)     The eGFR has been  calculated using the CKD EPI equation.     This calculation has not been validated in all clinical situations.     eGFR's persistently <90 mL/min signify possible Chronic Kidney     Disease.  URINALYSIS, ROUTINE W REFLEX MICROSCOPIC     Status: Abnormal   Collection Time    04/29/14  6:25 AM      Result Value Ref Range   Color, Urine YELLOW  YELLOW   APPearance CLEAR  CLEAR   Specific Gravity, Urine 1.020  1.005 - 1.030   pH 5.5  5.0 - 8.0   Glucose, UA NEGATIVE  NEGATIVE mg/dL   Hgb urine dipstick SMALL (*) NEGATIVE   Bilirubin Urine NEGATIVE  NEGATIVE   Ketones, ur NEGATIVE  NEGATIVE mg/dL   Protein, ur NEGATIVE  NEGATIVE mg/dL   Urobilinogen, UA 0.2  0.0 - 1.0 mg/dL   Nitrite NEGATIVE  NEGATIVE   Leukocytes, UA NEGATIVE  NEGATIVE  URINE MICROSCOPIC-ADD ON     Status: None   Collection Time    04/29/14  6:25 AM      Result Value Ref Range   WBC, UA 0-2  <3 WBC/hpf  RBC / HPF 3-6  <3 RBC/hpf   Bacteria, UA RARE  RARE  GLUCOSE, CAPILLARY     Status: Abnormal   Collection Time    04/29/14  7:21 AM      Result Value Ref Range   Glucose-Capillary 167 (*) 70 - 99 mg/dL   Comment 1 Notify RN      Ct Head Wo Contrast  04/27/2014   CLINICAL DATA:  Multiple sclerosis. Numbness in the left side of the face. History of multiple sclerosis.  EXAM: CT HEAD WITHOUT CONTRAST  TECHNIQUE: Contiguous axial images were obtained from the base of the skull through the vertex without intravenous contrast.  COMPARISON:  CT head 08/19/2013.  MRI brain 08/19/2013.  FINDINGS: No mass lesion, mass effect, midline shift, hydrocephalus, hemorrhage. No territorial ischemia or acute infarction. Scattered areas of periventricular low attenuation are present compatible with stated clinical history of multiple sclerosis and correlating with white matter lesions identified on prior examination.  IMPRESSION: No acute intracranial abnormality. Scattered periventricular white-matter lesion compatible with history of  multiple sclerosis.   Electronically Signed   By: Dereck Ligas M.D.   On: 04/27/2014 22:03   Mr Jeri Cos WE Contrast  04/28/2014   CLINICAL DATA:  Multiple sclerosis. Woke up with left leg weakness and inability to bear weight.  EXAM: MRI HEAD WITHOUT AND WITH CONTRAST  TECHNIQUE: Multiplanar, multiecho pulse sequences of the brain and surrounding structures were obtained without and with intravenous contrast.  CONTRAST:  29m MULTIHANCE GADOBENATE DIMEGLUMINE 529 MG/ML IV SOLN  COMPARISON:  MRI brain 08/19/2013. MRI cervical spine today reported separately.  FINDINGS: No evidence for acute infarction, hemorrhage, mass lesion, hydrocephalus, or extra-axial fluid. Normal for age cerebral volume. No large vessel or lacunar infarcts.  No diffusion abnormality or postcontrast enhancement to suggest an acute white matter lesion. There are scattered subcortical and periventricular signal abnormalities, largely subcentimeter in size, scattered throughout the supratentorial region, sparing the brainstem and cerebellum. No convincing involvement of the callosal-septal interface. No new lesions since October 2014.  Pituitary, pineal, and cerebellar tonsils unremarkable. No upper cervical lesions. Flow voids are maintained throughout the carotid, basilar, and vertebral arteries. There are no areas of chronic hemorrhage. Visualized calvarium, skull base, and upper cervical osseous structures unremarkable. Scalp and extracranial soft tissues, orbits, sinuses, and mastoids show no acute process. Incidental sebaceous cyst in the frontal scalp.  IMPRESSION: Stable nonspecific white matter signal abnormality. In the appropriate clinical setting these could be consistent with the diagnosis of multiple sclerosis, but there are no worrisome features, such as diffusion abnormality or postcontrast enhancement on today's exam, and no definite progression since 2014.   Electronically Signed   By: JRolla FlattenM.D.   On: 04/28/2014  19:17   Mr Cervical Spine W Wo Contrast  04/28/2014   CLINICAL DATA:  Diagnosed with MS. along with left leg weakness.  EXAM: MRI CERVICAL SPINE WITHOUT AND WITH CONTRAST  TECHNIQUE: Multiplanar and multiecho pulse sequences of the cervical spine, to include the craniocervical junction and cervicothoracic junction, were obtained according to standard protocol without and with intravenous contrast.  CONTRAST:  MultiHance 20 mL.  COMPARISON:  None.  FINDINGS: No evidence for disc degeneration, disc herniation, vertebral body abnormality or paraspinous mass.  Cord is normal size and signal throughout the cervical spine. Negative visualized upper thoracic spinal cord.  No osseous findings. Cervicomedullary junction within normal limits. No abnormal enhancement postcontrast.  Unchanged appearance from prior normal study.  IMPRESSION: Negative cervical spine  MRI without and with contrast.   Electronically Signed   By: Rolla Flatten M.D.   On: 04/28/2014 19:34        Kofi A. [name], M.D.  Diplomate, [employer] adviser of Psychiatry and Neurology ( Neurology). 04/29/2014, 8:57 AM

## 2014-04-29 NOTE — Care Management Note (Signed)
    Page 1 of 1   04/29/2014     11:42:00 AM CARE MANAGEMENT NOTE 04/29/2014  Patient:  Kristine Miller, Kristine Miller   Account Number:  [id number]  Date Initiated:  04/29/2014  Documentation initiated by:  Claretha Cooper  Subjective/Objective Assessment:   Pt lives at home with her spouse. In for MS exacerbation. No HH or DME needs identified     Action/Plan:   Anticipated DC Date:  05/01/2014   Anticipated DC Plan:  [place]  CM consult      Choice offered to / List presented to:             Status of service:  Completed, signed off Medicare Important Message given?   (If response is "NO", the following Medicare IM given date fields will be blank) Date Medicare IM given:   Date Additional Medicare IM given:    Discharge Disposition:    Per UR Regulation:    If discussed at Long Length of Stay Meetings, dates discussed:    Comments:  04/29/14 Claretha Cooper RN BSN CM

## 2014-04-29 NOTE — Progress Notes (Signed)
Utilization Review Complete  

## 2014-04-30 LAB — GLUCOSE, CAPILLARY
Glucose-Capillary: 122 mg/dL — ABNORMAL HIGH (ref 70–99)
Glucose-Capillary: 125 mg/dL — ABNORMAL HIGH (ref 70–99)
Glucose-Capillary: 135 mg/dL — ABNORMAL HIGH (ref 70–99)
Glucose-Capillary: 148 mg/dL — ABNORMAL HIGH (ref 70–99)

## 2014-04-30 LAB — CBC
HEMATOCRIT: 40.9 % (ref 36.0–46.0)
Hemoglobin: 13.7 g/dL (ref 12.0–15.0)
MCH: 29.3 pg (ref 26.0–34.0)
MCHC: 33.5 g/dL (ref 30.0–36.0)
MCV: 87.4 fL (ref 78.0–100.0)
Platelets: 349 10*3/uL (ref 150–400)
RBC: 4.68 MIL/uL (ref 3.87–5.11)
RDW: 13.9 % (ref 11.5–15.5)
WBC: 23.7 10*3/uL — ABNORMAL HIGH (ref 4.0–10.5)

## 2014-04-30 LAB — BASIC METABOLIC PANEL WITH GFR
BUN: 17 mg/dL (ref 6–23)
CO2: 29 meq/L (ref 19–32)
Calcium: 9.2 mg/dL (ref 8.4–10.5)
Chloride: 107 meq/L (ref 96–112)
Creatinine, Ser: 0.83 mg/dL (ref 0.50–1.10)
GFR calc Af Amer: >90 mL/min
GFR calc non Af Amer: 80 mL/min — ABNORMAL LOW
Glucose, Bld: 143 mg/dL — ABNORMAL HIGH (ref 70–99)
Potassium: 4.4 meq/L (ref 3.7–5.3)
Sodium: 147 meq/L (ref 137–147)

## 2014-04-30 MED ORDER — PANTOPRAZOLE SODIUM 40 MG PO TBEC: 40.0000 mg | DELAYED_RELEASE_TABLET | Freq: Every day | ORAL | Status: DC

## 2014-04-30 MED ORDER — PANTOPRAZOLE SODIUM 40 MG IV SOLR
40.0000 mg | INTRAVENOUS | Status: DC
  Administered 2014-04-30 – 2014-05-01 (×2): 40 mg via INTRAVENOUS
  Filled 2014-04-30 (×2): qty 40

## 2014-04-30 NOTE — Evaluation (Signed)
Physical Therapy Evaluation Patient Details Name: Kristine Miller MRN: [id number] DOB: April 20, 1961 Today's Date: 04/30/2014   History of Present Illness  Kristine Miller is a 53 y.o. female with a past medical history of obesity, multiple sclerosis diagnosed in 2014, who was in her usual state of health over the past 1 week when she started experiencing generalized weakness. She has been out cutting grass in the heat and then yesterday she presented to the ED with complaints of chest pain, but was also experiencing left-sided numbness including her head, neck, left arm and left leg. She was given high dose of steroids in the ED, and was lost to followup with her neurologist. Over the course of the night the symptoms worsened and this morning when she woke up she felt her left legs to be extremely weak and couldn't walk as a result. She mentions that she was diagnosed with MS in October of 2014. She is not taking any immune therapy for the same nor is she taking any other medications. As a result of the MS she has noticed changes in her speech and has also noticed some difficulty with swallowing solid foods. She denies any fever or chills recently. He does admit exposure to heat in the last couple days. Denies any dysuria, hematuria. No shortness of breath. She had chest pain yesterday, but none currently. She feels, as if her heart is racing. She describes the left leg has been very heavy. She also has had left-sided headache for the last 2, days. Denies any recent changes to her medications. She called her neurologist and was asked to come in to the emergency department and be admitted.  Clinical Impression  Pt is a 53 year old female who presents to PT with MS exacerbation.  Patient lives in a [facility] with 5-6 steps to enter with (B) handrails with her husband and son who are able to help 24/7.  Prior to exacerbation, the patient was (I) with bed mobility skills, transfers, and ambulation skills.  Since  exacerbation, patient reports she has been unable to walk secondary to Lt LE weakness; patient has been able to amb some this morning and was amb to sink when PT walked into room.  Pt able to am ~10 feet, with use of walker and slow gait.  Pt unable to clear Lt foot and slide foot on ground to advance foot.  Pt required some assist with bed mobility skills and transfers today.  Recommend continued PT while in the hospital, and transition to [place] at discharge to address strengthening, activity tolerance and improvement of functional mobility skills.  No DME recommended as patient has personal equipment.     Follow Up Recommendations Home health PT    Equipment Recommendations  None recommended by PT       Precautions / Restrictions Precautions Precautions: Fall Restrictions Weight Bearing Restrictions: No      Mobility  Bed Mobility Overal bed mobility: Needs Assistance Bed Mobility: Supine to Sit;Sit to Supine     Supine to sit: Min assist (for trunk) Sit to supine: Min guard      Transfers Overall transfer level: Needs assistance Equipment used: Standard walker Transfers: Sit to/from Stand Sit to Stand: Min assist            Ambulation/Gait Ambulation/Gait assistance: Supervision Ambulation Distance (Feet): 10 Feet Assistive device: Rolling walker (2 wheeled) Gait Pattern/deviations: Step-to pattern;Decreased dorsiflexion - left;Decreased step length - left   Gait velocity interpretation: Below normal speed for  age/gender General Gait Details: Pt unable to clear Lt foot secondary to weakness, and slides foot forward during gait.  No difficulty clearing Rt foot or propelling walker        Balance Overall balance assessment: Needs assistance Sitting-balance support: Feet supported Sitting balance-Leahy Scale: Good     Standing balance support: Bilateral upper extremity supported;During functional activity (on RW secondary to Lt LE weakness)                                  Pertinent Vitals/Pain No pain reported.     Home Living Family/patient expects to be discharged to:: Private residence Living Arrangements: Spouse/significant other Available Help at Discharge: Family;Available PRN/intermittently Type of Home: Mobile home Home Access: Stairs to enter Entrance Stairs-Rails: Can reach both Entrance Stairs-Number of Steps: 4-5 Home Layout: One level Home Equipment: Wheelchair - [employer] - 2 wheels;Cane - single point;Shower seat - built in;Toilet riser      Prior Function Level of Independence: Independent               Hand Dominance   Dominant Hand: Right    Extremity/Trunk Assessment               Lower Extremity Assessment: LLE deficits/detail   LLE Deficits / Details: MMT hip 2+/5, knee 3-/5, ankle 3/5     Communication   Communication: No difficulties  Cognition Arousal/Alertness: Awake/alert Behavior During Therapy: WFL for tasks assessed/performed Overall Cognitive Status: Within Functional Limits for tasks assessed                               Assessment/Plan    PT Assessment Patient needs continued PT services  PT Diagnosis Difficulty walking   PT Problem List Decreased strength;Decreased activity tolerance;Decreased mobility  PT Treatment Interventions Balance training;Gait training;Neuromuscular re-education;Functional mobility training;Therapeutic activities;Therapeutic exercise   PT Goals (Current goals can be found in the Care Plan section) Acute Rehab PT Goals PT Goal Formulation: With patient/family Time For Goal Achievement: 05/14/14 Potential to Achieve Goals: Good    Frequency Min 3X/week    End of Session Equipment Utilized During Treatment: Gait belt Activity Tolerance: Patient limited by fatigue Patient left: in bed;with family/visitor present;with call bell/phone within reach           Time: 0900-0922 PT Time Calculation (min): 22  min   Charges:   PT Evaluation $Initial PT Evaluation Tier I: 1 Procedure          WOODWORTH,STEPHANIE 04/30/2014, 10:39 AM

## 2014-04-30 NOTE — Progress Notes (Signed)
Progress Note   Kristine Miller [id number]:[id number] DOB: May 15, 1961 DOA: 04/28/2014 PCP: [name], MD   Brief Narrative:   Kristine Miller is an 53 y.o. female with a PMH of obesity, multiple sclerosis diagnosed in 2014 who was admitted on 04/28/14 chief complaint of chest pain and left-sided numbness. She is being treated for an MS exacerbation with IV steroids. Neurology is on board.  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  Status post MRI of the brain which showed stable lesions.  Physical therapy consulted for gait training.  Seen by ST, no further F/U indicated.  Will need to consider outpatient immunomodulating therapy.  Active Problems: Obesity  Encouraged ongoing weight loss efforts.   Anxiety state, unspecified  Continue Ativan every 6 hours as needed.  Steroid-induced Hyperglycemia  Hemoglobin A1c 5.6%.  Continue moderate scale SSI. CBGs 122-167.  DVT Prophylaxis  Continue Lovenox.  Code Status: Full. Family Communication: Husband at bedside. Disposition Plan: Home when stable.   IV Access:    Peripheral IV   Procedures:    None.   Medical Consultants:    Dr. Phillips Odor, Neurology   Other Consultants:    Physical therapy  Speech therapy   Anti-Infectives:    None.  Subjective:   Kristine Miller complains of nausea, requests Protonix be ordered IV.  No vomiting.  Some ongoing left sided numbness.  Objective:    Filed Vitals:   04/29/14 0558 04/29/14 1348 04/29/14 2216 04/30/14 0658  BP: 126/74 144/74 144/78 115/44  Pulse: 79 78 66 56  Temp: 97.8 F (36.6 C) 98.7 F (37.1 C) 97.7 F (36.5 C) 97.4 F (36.3 C)  TempSrc: Oral Oral Oral Oral  Resp: 18 20 20 20   Height:      Weight:      SpO2: 92% 97% 95% 95%     Intake/Output Summary (Last 24 hours) at 04/30/14 0805 Last data filed at 04/29/14 2002  Gross per 24 hour  Intake 1130.5 ml  Output   1450 ml  Net -319.5 ml    Exam: Gen:  NAD Cardiovascular:  RRR, No M/R/G Respiratory:  Lungs CTAB Gastrointestinal:  Abdomen soft, NT/ND, + BS Extremities:  Trace edema   Data Reviewed:    Labs: Basic Metabolic Panel:  Recent Labs Lab 04/27/14 2051 04/28/14 1631 04/29/14 0554 04/30/14 0605  NA 144 142 145 147  K 4.0 4.2 4.8 4.4  CL 105 102 107 107  CO2 25 23 26 29   GLUCOSE 104* 166* 195* 143*  BUN 13 10 13 17   CREATININE 0.90 0.79 0.80 0.83  CALCIUM 9.2 10.0 9.4 9.2   GFR Estimated Creatinine Clearance: 86.6 ml/min (by C-G formula based on Cr of 0.83). Liver Function Tests:  Recent Labs Lab 04/27/14 2051 04/29/14 0554  AST 15 12  ALT 13 12  ALKPHOS 82 76  BILITOT 0.2* <0.2*  PROT 7.0 6.5  ALBUMIN 3.6 3.3*    CBC:  Recent Labs Lab 04/27/14 2051 04/28/14 1631 04/29/14 0554 04/30/14 0605  WBC 10.9* 16.7* 20.1* 23.7*  NEUTROABS 6.4 15.3*  --   --   HGB 14.2 15.3* 13.8 13.7  HCT 41.7 43.6 41.1 40.9  MCV 86.5 85.8 88.2 87.4  PLT 334 372 375 349   Cardiac Enzymes:  Recent Labs Lab 04/27/14 2051  TROPONINI <0.30   CBG:  Recent Labs Lab 04/29/14 0721 04/29/14 1131 04/29/14 1824 04/29/14 2159 04/30/14 0736  GLUCAP 167* 137* 171* 124* 122*   Hgb A1c:  Recent Labs  04/29/14 0554  HGBA1C 5.7*   Sepsis Labs:  Recent Labs Lab 04/27/14 2051 04/28/14 1631 04/29/14 0554 04/30/14 0605  WBC 10.9* 16.7* 20.1* 23.7*   Microbiology No results found for this or any previous visit (from the past 240 hour(s)).   Radiographs/Studies:   Ct Head Wo Contrast  04/27/2014   CLINICAL DATA:  Multiple sclerosis. Numbness in the left side of the face. History of multiple sclerosis.  EXAM: CT HEAD WITHOUT CONTRAST  TECHNIQUE: Contiguous axial images were obtained from the base of the skull through the  vertex without intravenous contrast.  COMPARISON:  CT head 08/19/2013.  MRI brain 08/19/2013.  FINDINGS: No mass lesion, mass effect, midline shift, hydrocephalus, hemorrhage. No territorial ischemia or acute infarction. Scattered areas of periventricular low attenuation are present compatible with stated clinical history of multiple sclerosis and correlating with white matter lesions identified on prior examination.  IMPRESSION: No acute intracranial abnormality. Scattered periventricular white-matter lesion compatible with history of multiple sclerosis.   Electronically Signed   By: Dereck Ligas M.D.   On: 04/27/2014 22:03   Mr Jeri Cos UU Contrast  04/28/2014   CLINICAL DATA:  Multiple sclerosis. Woke up with left leg weakness and inability to bear weight.  EXAM: MRI HEAD WITHOUT AND WITH CONTRAST  TECHNIQUE: Multiplanar, multiecho pulse sequences of the brain and surrounding structures were obtained without and with intravenous contrast.  CONTRAST:  44mL MULTIHANCE GADOBENATE DIMEGLUMINE 529 MG/ML IV SOLN  COMPARISON:  MRI brain 08/19/2013. MRI cervical spine today reported separately.  FINDINGS: No evidence for acute infarction, hemorrhage, mass lesion, hydrocephalus, or extra-axial fluid. Normal for age cerebral volume. No large vessel or lacunar infarcts.  No diffusion abnormality or postcontrast enhancement to suggest an acute white matter lesion. There are scattered subcortical and periventricular signal abnormalities, largely subcentimeter in size, scattered throughout the supratentorial region, sparing the brainstem and cerebellum. No convincing involvement of the callosal-septal interface. No new lesions since October 2014.  Pituitary, pineal, and cerebellar tonsils unremarkable. No upper cervical lesions. Flow voids are maintained throughout the carotid, basilar, and vertebral arteries. There are no areas of chronic hemorrhage. Visualized calvarium, skull base, and upper cervical osseous structures  unremarkable. Scalp and extracranial soft tissues, orbits, sinuses, and mastoids show no acute process. Incidental sebaceous cyst in the frontal scalp.  IMPRESSION: Stable nonspecific white matter signal abnormality. In the appropriate clinical setting these could be consistent with the diagnosis of multiple sclerosis, but there are no worrisome features, such as diffusion abnormality or postcontrast enhancement on today's exam, and no definite progression since 2014.   Electronically Signed   By: Rolla Flatten M.D.   On: 04/28/2014 19:17   Mr Cervical Spine W Wo Contrast  04/28/2014   CLINICAL DATA:  Diagnosed with MS. along with left leg weakness.  EXAM: MRI CERVICAL SPINE WITHOUT AND WITH CONTRAST  TECHNIQUE: Multiplanar and multiecho pulse sequences of the cervical spine, to include the craniocervical junction and cervicothoracic junction, were obtained according to standard protocol without and with intravenous contrast.  CONTRAST:  MultiHance 20 mL.  COMPARISON:  None.  FINDINGS: No evidence for disc degeneration, disc herniation, vertebral body abnormality or paraspinous mass.  Cord is normal size and signal throughout the cervical spine. Negative visualized upper thoracic spinal cord.  No osseous findings. Cervicomedullary junction within normal limits. No abnormal  enhancement postcontrast.  Unchanged appearance from prior normal study.  IMPRESSION: Negative cervical spine MRI without and with contrast.   Electronically Signed   By: Rolla Flatten M.D.   On: 04/28/2014 19:34    Medications:   . enoxaparin (LOVENOX) injection  50 mg Subcutaneous Q24H  . insulin aspart  0-15 Units Subcutaneous TID WC  . insulin aspart  0-5 Units Subcutaneous QHS  . methylPREDNISolone (SOLU-MEDROL) injection  1,000 mg Intravenous Daily   Continuous Infusions:   Time spent: 25 minutes.   LOS: 2 days   RAMA,CHRISTINA  [facility] Pager [phone]. If unable to reach me by pager, please call my cell phone at  [phone].  *Please refer to [email or web address], password [email or web address] to get updated schedule on who will round on this patient, as hospitalists switch teams weekly. If 7PM-7AM, please contact night-coverage at [email or web address], password [email or web address] for any overnight needs.  04/30/2014, 8:05 AM    **Disclaimer: This note was dictated with voice recognition software. Similar sounding words can inadvertently be transcribed and this note may contain transcription errors which may not have been corrected upon publication of note.**

## 2014-04-30 NOTE — Progress Notes (Signed)
Patient ID: Kristine Miller, female   DOB: 10-Apr-1961, 53 y.o.   MRN: [id number]  Kristine A. [name], MD     [email or web address]          Kristine Miller is an 53 y.o. female.   Assessment/Plan: 1. Acute relapse/exacerbation of multiple sclerosis. We will go ahead and complete a 5 day course of IV Solu-Medrol high dose. She has gone 2 doses already and will be given 3 additional doses. Physical therapy will be consulted for gait training. We need to address issue immunomodulating therapy but she is currently uninsured. We will still see if funding can be arranged.  2. Hyperglycemia suspected to be due to high dose Solu-Medrol. Continue with sliding scale.  3. Obesity.   She complains of having some nausea today. This is likely due to the Solu-Medrol. Her medications will be given.  Weakness is about the same.  GENERAL: This very pleasant obese female in no acute distress.  HEENT: Supple. Atraumatic normocephalic.  ABDOMEN: soft  EXTREMITIES: No edema  BACK: Normal.  SKIN: Normal by inspection.  MENTAL STATUS: Alert and oriented. Speech, language and cognition are generally intact. Judgment and insight normal.  CRANIAL NERVES: Pupils are equal, round and reactive to light and accommodation; extra ocular movements are full, there is no significant nystagmus; visual fields are full; upper and lower facial muscles are normal in strength and symmetric, there is no flattening of the nasolabial folds; tongue is midline; uvula is midline; shoulder elevation is normal.  MOTOR: Normal tone, bulk and strength- Right upper and lower extremities; no pronator drift. Left upper extremity: Deltoid 4 minus/5, triceps 5 and handgrip 5. Left lower extremity: Hip flexion 2/5 And dorsiflexion 4/5.  COORDINATION: Left finger to nose is normal, right finger to nose is normal, No rest tremor; no intention tremor; no postural tremor; no bradykinesia.  REFLEXES: Deep tendon reflexes are  symmetrical and normal.  SENSATION: Reduced sensation involving the entire left Hemibody, neck and facial region.        Objective: Vital signs in last 24 hours: Temp:  [97.4 F (36.3 C)-98.7 F (37.1 C)] 97.4 F (36.3 C) (06/17 0658) Pulse Rate:  [56-78] 56 (06/17 0658) Resp:  [20] 20 (06/17 0658) BP: (115-144)/(44-78) 115/44 mmHg (06/17 0658) SpO2:  [95 %-97 %] 95 % (06/17 0658)  Intake/Output from previous day: 06/16 0701 - 06/17 0700 In: 1130.5 [P.O.:720; I.V.:352.5; IV Piggyback:58] Out: 1450 [Urine:1450] Intake/Output this shift:   Nutritional status: General   Lab Results: Results for orders placed during the hospital encounter of 04/28/14 (from the past 48 hour(s))  CBC WITH DIFFERENTIAL     Status: Abnormal   Collection Time    04/28/14  4:31 PM      Result Value Ref Range   WBC 16.7 (*) 4.0 - 10.5 K/uL   RBC 5.08  3.87 - 5.11 MIL/uL   Hemoglobin 15.3 (*) 12.0 - 15.0 g/dL   HCT 43.6  36.0 - 46.0 %   MCV 85.8  78.0 - 100.0 fL   MCH 30.1  26.0 - 34.0 pg   MCHC 35.1  30.0 - 36.0 g/dL   RDW 13.4  11.5 - 15.5 %   Platelets 372  150 - 400 K/uL   Neutrophils Relative % 92 (*) 43 - 77 %   Neutro Abs 15.3 (*) 1.7 - 7.7 K/uL   Lymphocytes Relative 7 (*) 12 - 46 %   Lymphs Abs 1.2  0.7 - 4.0 K/uL  Monocytes Relative 1 (*) 3 - 12 %   Monocytes Absolute 0.1  0.1 - 1.0 K/uL   Eosinophils Relative 0  0 - 5 %   Eosinophils Absolute 0.0  0.0 - 0.7 K/uL   Basophils Relative 0  0 - 1 %   Basophils Absolute 0.0  0.0 - 0.1 K/uL  BASIC METABOLIC PANEL     Status: Abnormal   Collection Time    04/28/14  4:31 PM      Result Value Ref Range   Sodium 142  137 - 147 mEq/L   Potassium 4.2  3.7 - 5.3 mEq/L   Chloride 102  96 - 112 mEq/L   CO2 23  19 - 32 mEq/L   Glucose, Bld 166 (*) 70 - 99 mg/dL   BUN 10  6 - 23 mg/dL   Creatinine, Ser 0.79  0.50 - 1.10 mg/dL   Calcium 10.0  8.4 - 10.5 mg/dL   GFR calc non Af Amer >90  >90 mL/min   GFR calc Af Amer >90  >90 mL/min    Comment: (NOTE)     The eGFR has been calculated using the CKD EPI equation.     This calculation has not been validated in all clinical situations.     eGFR's persistently <90 mL/min signify possible Chronic Kidney     Disease.  GLUCOSE, CAPILLARY     Status: Abnormal   Collection Time    04/29/14 12:00 AM      Result Value Ref Range   Glucose-Capillary 165 (*) 70 - 99 mg/dL   Comment 1 Documented in Chart     Comment 2 Notify RN    CBC     Status: Abnormal   Collection Time    04/29/14  5:54 AM      Result Value Ref Range   WBC 20.1 (*) 4.0 - 10.5 K/uL   RBC 4.66  3.87 - 5.11 MIL/uL   Hemoglobin 13.8  12.0 - 15.0 g/dL   HCT 41.1  36.0 - 46.0 %   MCV 88.2  78.0 - 100.0 fL   MCH 29.6  26.0 - 34.0 pg   MCHC 33.6  30.0 - 36.0 g/dL   RDW 13.8  11.5 - 15.5 %   Platelets 375  150 - 400 K/uL  COMPREHENSIVE METABOLIC PANEL     Status: Abnormal   Collection Time    04/29/14  5:54 AM      Result Value Ref Range   Sodium 145  137 - 147 mEq/L   Potassium 4.8  3.7 - 5.3 mEq/L   Chloride 107  96 - 112 mEq/L   CO2 26  19 - 32 mEq/L   Glucose, Bld 195 (*) 70 - 99 mg/dL   BUN 13  6 - 23 mg/dL   Creatinine, Ser 0.80  0.50 - 1.10 mg/dL   Calcium 9.4  8.4 - 10.5 mg/dL   Total Protein 6.5  6.0 - 8.3 g/dL   Albumin 3.3 (*) 3.5 - 5.2 g/dL   AST 12  0 - 37 U/L   ALT 12  0 - 35 U/L   Alkaline Phosphatase 76  39 - 117 U/L   Total Bilirubin <0.2 (*) 0.3 - 1.2 mg/dL   GFR calc non Af Amer 84 (*) >90 mL/min   GFR calc Af Amer >90  >90 mL/min   Comment: (NOTE)     The eGFR has been calculated using the CKD EPI equation.  This calculation has not been validated in all clinical situations.     eGFR's persistently <90 mL/min signify possible Chronic Kidney     Disease.  HEMOGLOBIN A1C     Status: Abnormal   Collection Time    04/29/14  5:54 AM      Result Value Ref Range   Hemoglobin A1C 5.7 (*) <5.7 %   Comment: (NOTE)                                                                                According to the ADA Clinical Practice Recommendations for 2011, when     HbA1c is used as a screening test:      >=6.5%   Diagnostic of Diabetes Mellitus               (if abnormal result is confirmed)     5.7-6.4%   Increased risk of developing Diabetes Mellitus     References:Diagnosis and Classification of Diabetes Mellitus,Diabetes     TMAU,6333,54(TGYBW 1):S62-S69 and Standards of Medical Care in             Diabetes - 2011,Diabetes Care,2011,34 (Suppl 1):S11-S61.   Mean Plasma Glucose 117 (*) <117 mg/dL   Comment: Performed at [facility], ROUTINE W REFLEX MICROSCOPIC     Status: Abnormal   Collection Time    04/29/14  6:25 AM      Result Value Ref Range   Color, Urine YELLOW  YELLOW   APPearance CLEAR  CLEAR   Specific Gravity, Urine 1.020  1.005 - 1.030   pH 5.5  5.0 - 8.0   Glucose, UA NEGATIVE  NEGATIVE mg/dL   Hgb urine dipstick SMALL (*) NEGATIVE   Bilirubin Urine NEGATIVE  NEGATIVE   Ketones, ur NEGATIVE  NEGATIVE mg/dL   Protein, ur NEGATIVE  NEGATIVE mg/dL   Urobilinogen, UA 0.2  0.0 - 1.0 mg/dL   Nitrite NEGATIVE  NEGATIVE   Leukocytes, UA NEGATIVE  NEGATIVE  URINE MICROSCOPIC-ADD ON     Status: None   Collection Time    04/29/14  6:25 AM      Result Value Ref Range   WBC, UA 0-2  <3 WBC/hpf   RBC / HPF 3-6  <3 RBC/hpf   Bacteria, UA RARE  RARE  GLUCOSE, CAPILLARY     Status: Abnormal   Collection Time    04/29/14  7:21 AM      Result Value Ref Range   Glucose-Capillary 167 (*) 70 - 99 mg/dL   Comment 1 Notify RN    GLUCOSE, CAPILLARY     Status: Abnormal   Collection Time    04/29/14 11:31 AM      Result Value Ref Range   Glucose-Capillary 137 (*) 70 - 99 mg/dL  GLUCOSE, CAPILLARY     Status: Abnormal   Collection Time    04/29/14  6:24 PM      Result Value Ref Range   Glucose-Capillary 171 (*) 70 - 99 mg/dL   Comment 1 Notify RN     Comment 2 Documented in Chart    GLUCOSE, CAPILLARY     Status: Abnormal   Collection  Time  04/29/14  9:59 PM      Result Value Ref Range   Glucose-Capillary 124 (*) 70 - 99 mg/dL  CBC     Status: Abnormal   Collection Time    04/30/14  6:05 AM      Result Value Ref Range   WBC 23.7 (*) 4.0 - 10.5 K/uL   RBC 4.68  3.87 - 5.11 MIL/uL   Hemoglobin 13.7  12.0 - 15.0 g/dL   HCT 40.9  36.0 - 46.0 %   MCV 87.4  78.0 - 100.0 fL   MCH 29.3  26.0 - 34.0 pg   MCHC 33.5  30.0 - 36.0 g/dL   RDW 13.9  11.5 - 15.5 %   Platelets 349  150 - 400 K/uL  BASIC METABOLIC PANEL     Status: Abnormal   Collection Time    04/30/14  6:05 AM      Result Value Ref Range   Sodium 147  137 - 147 mEq/L   Potassium 4.4  3.7 - 5.3 mEq/L   Chloride 107  96 - 112 mEq/L   CO2 29  19 - 32 mEq/L   Glucose, Bld 143 (*) 70 - 99 mg/dL   BUN 17  6 - 23 mg/dL   Creatinine, Ser 0.83  0.50 - 1.10 mg/dL   Calcium 9.2  8.4 - 10.5 mg/dL   GFR calc non Af Amer 80 (*) >90 mL/min   GFR calc Af Amer >90  >90 mL/min   Comment: (NOTE)     The eGFR has been calculated using the CKD EPI equation.     This calculation has not been validated in all clinical situations.     eGFR's persistently <90 mL/min signify possible Chronic Kidney     Disease.  GLUCOSE, CAPILLARY     Status: Abnormal   Collection Time    04/30/14  7:36 AM      Result Value Ref Range   Glucose-Capillary 122 (*) 70 - 99 mg/dL   Comment 1 Notify RN      Lipid Panel No results found for this basename: CHOL, TRIG, HDL, CHOLHDL, VLDL, LDLCALC,  in the last 72 hours  Studies/Results: Mr Jeri Cos Wo Contrast  04/28/2014   CLINICAL DATA:  Multiple sclerosis. Woke up with left leg weakness and inability to bear weight.  EXAM: MRI HEAD WITHOUT AND WITH CONTRAST  TECHNIQUE: Multiplanar, multiecho pulse sequences of the brain and surrounding structures were obtained without and with intravenous contrast.  CONTRAST:  45m MULTIHANCE GADOBENATE DIMEGLUMINE 529 MG/ML IV SOLN  COMPARISON:  MRI brain 08/19/2013. MRI cervical spine today reported  separately.  FINDINGS: No evidence for acute infarction, hemorrhage, mass lesion, hydrocephalus, or extra-axial fluid. Normal for age cerebral volume. No large vessel or lacunar infarcts.  No diffusion abnormality or postcontrast enhancement to suggest an acute white matter lesion. There are scattered subcortical and periventricular signal abnormalities, largely subcentimeter in size, scattered throughout the supratentorial region, sparing the brainstem and cerebellum. No convincing involvement of the callosal-septal interface. No new lesions since October 2014.  Pituitary, pineal, and cerebellar tonsils unremarkable. No upper cervical lesions. Flow voids are maintained throughout the carotid, basilar, and vertebral arteries. There are no areas of chronic hemorrhage. Visualized calvarium, skull base, and upper cervical osseous structures unremarkable. Scalp and extracranial soft tissues, orbits, sinuses, and mastoids show no acute process. Incidental sebaceous cyst in the frontal scalp.  IMPRESSION: Stable nonspecific white matter signal abnormality. In the appropriate clinical setting these could  be consistent with the diagnosis of multiple sclerosis, but there are no worrisome features, such as diffusion abnormality or postcontrast enhancement on today's exam, and no definite progression since 2014.   Electronically Signed   By: Rolla Flatten M.D.   On: 04/28/2014 19:17   Mr Cervical Spine W Wo Contrast  04/28/2014   CLINICAL DATA:  Diagnosed with MS. along with left leg weakness.  EXAM: MRI CERVICAL SPINE WITHOUT AND WITH CONTRAST  TECHNIQUE: Multiplanar and multiecho pulse sequences of the cervical spine, to include the craniocervical junction and cervicothoracic junction, were obtained according to standard protocol without and with intravenous contrast.  CONTRAST:  MultiHance 20 mL.  COMPARISON:  None.  FINDINGS: No evidence for disc degeneration, disc herniation, vertebral body abnormality or paraspinous  mass.  Cord is normal size and signal throughout the cervical spine. Negative visualized upper thoracic spinal cord.  No osseous findings. Cervicomedullary junction within normal limits. No abnormal enhancement postcontrast.  Unchanged appearance from prior normal study.  IMPRESSION: Negative cervical spine MRI without and with contrast.   Electronically Signed   By: Rolla Flatten M.D.   On: 04/28/2014 19:34    Medications:  Scheduled Meds: . enoxaparin (LOVENOX) injection  50 mg Subcutaneous Q24H  . insulin aspart  0-15 Units Subcutaneous TID WC  . insulin aspart  0-5 Units Subcutaneous QHS  . methylPREDNISolone (SOLU-MEDROL) injection  1,000 mg Intravenous Daily   Continuous Infusions:  PRN Meds:.acetaminophen, acetaminophen, albuterol, aspirin-acetaminophen-caffeine, HYDROcodone-acetaminophen, LORazepam, ondansetron (ZOFRAN) IV, ondansetron     LOS: 2 days    A. Merlene Miller, M.D.  Diplomate, [employer] adviser of Psychiatry and Neurology ( Neurology).

## 2014-05-01 LAB — CBC
HEMATOCRIT: 41.1 % (ref 36.0–46.0)
Hemoglobin: 13.6 g/dL (ref 12.0–15.0)
MCH: 29.2 pg (ref 26.0–34.0)
MCHC: 33.1 g/dL (ref 30.0–36.0)
MCV: 88.4 fL (ref 78.0–100.0)
Platelets: 333 10*3/uL (ref 150–400)
RBC: 4.65 MIL/uL (ref 3.87–5.11)
RDW: 13.9 % (ref 11.5–15.5)
WBC: 18.3 10*3/uL — AB (ref 4.0–10.5)

## 2014-05-01 LAB — BASIC METABOLIC PANEL
BUN: 19 mg/dL (ref 6–23)
CHLORIDE: 106 meq/L (ref 96–112)
CO2: 31 mEq/L (ref 19–32)
CREATININE: 0.9 mg/dL (ref 0.50–1.10)
Calcium: 9.4 mg/dL (ref 8.4–10.5)
GFR calc Af Amer: 84 mL/min — ABNORMAL LOW (ref >90)
GFR calc non Af Amer: 73 mL/min — ABNORMAL LOW (ref >90)
Glucose, Bld: 125 mg/dL — ABNORMAL HIGH (ref 70–99)
POTASSIUM: 4.6 meq/L (ref 3.7–5.3)
Sodium: 146 mEq/L (ref 137–147)

## 2014-05-01 LAB — GLUCOSE, CAPILLARY
GLUCOSE-CAPILLARY: 106 mg/dL — AB (ref 70–99)
Glucose-Capillary: 124 mg/dL — ABNORMAL HIGH (ref 70–99)
Glucose-Capillary: 156 mg/dL — ABNORMAL HIGH (ref 70–99)
Glucose-Capillary: 200 mg/dL — ABNORMAL HIGH (ref 70–99)

## 2014-05-01 MED ORDER — SODIUM CHLORIDE 0.9 % IJ SOLN
3.0000 mL | Freq: Two times a day (BID) | INTRAMUSCULAR | Status: DC
  Administered 2014-05-01 (×2): 3 mL via INTRAVENOUS

## 2014-05-01 MED ORDER — SODIUM CHLORIDE 0.9 % IV SOLN: 250.0000 mL | INTRAVENOUS | Status: DC | PRN

## 2014-05-01 MED ORDER — POLYETHYLENE GLYCOL 3350 17 G PO PACK
17.0000 g | PACK | Freq: Every day | ORAL | Status: DC
  Administered 2014-05-01 – 2014-05-02 (×2): 17 g via ORAL
  Filled 2014-05-01 (×2): qty 1

## 2014-05-01 MED ORDER — SODIUM CHLORIDE 0.9 % IJ SOLN: 3.0000 mL | INTRAMUSCULAR | Status: DC | PRN

## 2014-05-01 MED ORDER — MAGNESIUM CITRATE PO SOLN: 1.0000 | Freq: Every day | ORAL | Status: DC | PRN

## 2014-05-01 MED ORDER — PANTOPRAZOLE SODIUM 40 MG PO TBEC
40.0000 mg | DELAYED_RELEASE_TABLET | Freq: Every day | ORAL | Status: DC
  Administered 2014-05-02: 40 mg via ORAL
  Filled 2014-05-01: qty 1

## 2014-05-01 NOTE — Progress Notes (Signed)
Progress Note   Kristine Miller LGX:[id number] DOB: August 14, 1961 DOA: 04/28/2014 PCP: [name], MD   Brief Narrative:   Kristine Miller is an 53 y.o. female with a PMH of obesity, multiple sclerosis diagnosed in 2014 who was admitted on 04/28/14 chief complaint of chest pain and left-sided numbness. She is being treated for an MS exacerbation with IV steroids. Neurology is on board.  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  Status post MRI of the brain which showed stable lesions.  Physical therapy consulted for gait training.  Seen by ST, no further F/U indicated.  Will need to consider outpatient immunomodulating therapy.  Active Problems: Obesity  Encouraged ongoing weight loss efforts.   Anxiety state, unspecified  Continue Ativan every 6 hours as needed.  Steroid-induced Hyperglycemia  Hemoglobin A1c 5.6%.  Continue moderate scale SSI. CBGs 122-148.  DVT Prophylaxis  Continue Lovenox.  Code Status: Full. Family Communication: Husband at bedside. Disposition Plan: Home when stable.   IV Access:    Peripheral IV   Procedures:    None.   Medical Consultants:    Dr. Phillips Odor, Neurology   Other Consultants:    Physical therapy  Speech therapy   Anti-Infectives:    None.  Subjective:   Kristine Miller complains of nausea, headache and some difficulty swallowing, but feels like her strength is improving.  Objective:    Filed Vitals:   04/30/14 0658 04/30/14 1434 04/30/14 2205 05/01/14 0643  BP: 115/44 136/80 129/72 120/81  Pulse: 56 66 65 52  Temp: 97.4 F (36.3 C) 97.8 F (36.6 C) 97.8 F (36.6 C) 97.5 F (36.4 C)  TempSrc: Oral Oral Oral Oral  Resp: 20 20 20 20   Height:      Weight:      SpO2: 95% 96% 94% 95%     Intake/Output Summary (Last 24 hours) at 05/01/14 0729 Last data filed at 05/01/14 8144  Gross per 24 hour  Intake    538 ml  Output   2600 ml  Net  -2062 ml    Exam: Gen:  NAD Cardiovascular:  RRR, No M/R/G Respiratory:  Lungs CTAB Gastrointestinal:  Abdomen soft, NT/ND, + BS Extremities:  Trace edema   Data Reviewed:    Labs: Basic Metabolic Panel:  Recent Labs Lab 04/27/14 2051 04/28/14 1631 04/29/14 0554 04/30/14 0605 05/01/14 0606  NA 144 142 145 147 146  K 4.0 4.2 4.8 4.4 4.6  CL 105 102 107 107 106  CO2 25 23 26 29 31   GLUCOSE 104* 166* 195* 143* 125*  BUN 13 10 13 17 19   CREATININE 0.90 0.79 0.80 0.83 0.90  CALCIUM 9.2 10.0 9.4 9.2 9.4   GFR Estimated Creatinine Clearance: 79.9 ml/min (by C-G formula based on Cr of 0.9). Liver Function Tests:  Recent Labs Lab 04/27/14 2051 04/29/14 0554  AST 15 12  ALT 13 12  ALKPHOS 82 76  BILITOT 0.2* <0.2*  PROT 7.0 6.5  ALBUMIN 3.6 3.3*    CBC:  Recent Labs Lab 04/27/14 2051 04/28/14 1631 04/29/14 0554 04/30/14 0605 05/01/14 0606  WBC 10.9* 16.7* 20.1* 23.7* 18.3*  NEUTROABS 6.4 15.3*  --   --   --   HGB 14.2 15.3* 13.8 13.7 13.6  HCT 41.7 43.6 41.1 40.9 41.1  MCV 86.5 85.8 88.2 87.4 88.4  PLT 334 372 375 349 333   Cardiac Enzymes:  Recent Labs Lab 04/27/14 2051  TROPONINI <0.30   CBG:  Recent Labs Lab 04/29/14 2159 04/30/14 0736 04/30/14 1106 04/30/14 1643 04/30/14 2108  GLUCAP 124* 122* 125* 135* 148*   Hgb A1c:  Recent Labs  04/29/14 0554  HGBA1C 5.7*   Sepsis Labs:  Recent Labs Lab 04/28/14 1631 04/29/14 0554 04/30/14 0605 05/01/14 0606  WBC 16.7* 20.1* 23.7* 18.3*   Microbiology No results found for this or any previous visit (from the past 240 hour(s)).   Radiographs/Studies:   Ct Head Wo Contrast  04/27/2014   CLINICAL DATA:  Multiple sclerosis. Numbness in the left side of the face. History of multiple sclerosis.  EXAM: CT HEAD WITHOUT CONTRAST   TECHNIQUE: Contiguous axial images were obtained from the base of the skull through the vertex without intravenous contrast.  COMPARISON:  CT head 08/19/2013.  MRI brain 08/19/2013.  FINDINGS: No mass lesion, mass effect, midline shift, hydrocephalus, hemorrhage. No territorial ischemia or acute infarction. Scattered areas of periventricular low attenuation are present compatible with stated clinical history of multiple sclerosis and correlating with white matter lesions identified on prior examination.  IMPRESSION: No acute intracranial abnormality. Scattered periventricular white-matter lesion compatible with history of multiple sclerosis.   Electronically Signed   By: Dereck Ligas M.D.   On: 04/27/2014 22:03   Mr Jeri Cos SH Contrast  04/28/2014   CLINICAL DATA:  Multiple sclerosis. Woke up with left leg weakness and inability to bear weight.  EXAM: MRI HEAD WITHOUT AND WITH CONTRAST  TECHNIQUE: Multiplanar, multiecho pulse sequences of the brain and surrounding structures were obtained without and with intravenous contrast.  CONTRAST:  29mL MULTIHANCE GADOBENATE DIMEGLUMINE 529 MG/ML IV SOLN  COMPARISON:  MRI brain 08/19/2013. MRI cervical spine today reported separately.  FINDINGS: No evidence for acute infarction, hemorrhage, mass lesion, hydrocephalus, or extra-axial fluid. Normal for age cerebral volume. No large vessel or lacunar infarcts.  No diffusion abnormality or postcontrast enhancement to suggest an acute white matter lesion. There are scattered subcortical and periventricular signal abnormalities, largely subcentimeter in size, scattered throughout the supratentorial region, sparing the brainstem and cerebellum. No convincing involvement of the callosal-septal interface. No new lesions since October 2014.  Pituitary, pineal, and cerebellar tonsils unremarkable. No upper cervical lesions. Flow voids are maintained throughout the carotid, basilar, and vertebral arteries. There are no areas of  chronic hemorrhage. Visualized calvarium, skull base, and upper cervical osseous structures unremarkable. Scalp and extracranial soft tissues, orbits, sinuses, and mastoids show no acute process. Incidental sebaceous cyst in the frontal scalp.  IMPRESSION: Stable nonspecific white matter signal abnormality. In the appropriate clinical setting these could be consistent with the diagnosis of multiple sclerosis, but there are no worrisome features, such as diffusion abnormality or postcontrast enhancement on today's exam, and no definite progression since 2014.   Electronically Signed   By: Rolla Flatten M.D.   On: 04/28/2014 19:17   Mr Cervical Spine W Wo Contrast  04/28/2014   CLINICAL DATA:  Diagnosed with MS. along with left leg weakness.  EXAM: MRI CERVICAL SPINE WITHOUT AND WITH CONTRAST  TECHNIQUE: Multiplanar and multiecho pulse sequences of the cervical spine, to include the craniocervical junction and cervicothoracic junction, were obtained according to standard protocol without and with intravenous contrast.  CONTRAST:  MultiHance 20 mL.  COMPARISON:  None.  FINDINGS: No evidence for disc degeneration, disc herniation, vertebral body abnormality or paraspinous mass.  Cord is normal size  and signal throughout the cervical spine. Negative visualized upper thoracic spinal cord.  No osseous findings. Cervicomedullary junction within normal limits. No abnormal enhancement postcontrast.  Unchanged appearance from prior normal study.  IMPRESSION: Negative cervical spine MRI without and with contrast.   Electronically Signed   By: Rolla Flatten M.D.   On: 04/28/2014 19:34    Medications:   . enoxaparin (LOVENOX) injection  50 mg Subcutaneous Q24H  . insulin aspart  0-15 Units Subcutaneous TID WC  . insulin aspart  0-5 Units Subcutaneous QHS  . methylPREDNISolone (SOLU-MEDROL) injection  1,000 mg Intravenous Daily  . pantoprazole (PROTONIX) IV  40 mg Intravenous Q24H   Continuous Infusions:   Time  spent: 25 minutes.   LOS: 3 days   [facility] Pager [phone]. If unable to reach me by pager, please call my cell phone at [phone].  *Please refer to [email or web address], password [email or web address] to get updated schedule on who will round on this patient, as hospitalists switch teams weekly. If 7PM-7AM, please contact night-coverage at [email or web address], password [email or web address] for any overnight needs.  05/01/2014, 7:29 AM    **Disclaimer: This note was dictated with voice recognition software. Similar sounding words can inadvertently be transcribed and this note may contain transcription errors which may not have been corrected upon publication of note.**

## 2014-05-01 NOTE — Progress Notes (Signed)
Patient ID: Kristine Miller, female   DOB: 1961-04-21, 53 y.o.   MRN: [id number]  [place] A. [name], MD     [email or web address]          Kristine Miller is an 53 y.o. female.   Assessment/Plan: 1. Acute relapse/exacerbation of multiple sclerosis. 4 out 5 day course of IV Solu-Medrol high dose. We need to address issue immunomodulating therapy but she is currently uninsured. We will still see if funding can be arranged.  2. Hyperglycemia suspected to be due to high dose Solu-Medrol. Continue with sliding scale.  3. Obesity.   She reports mild improvement of LLE.  GENERAL: This very pleasant obese female in no acute distress.  HEENT: Supple. Atraumatic normocephalic.  ABDOMEN: soft  EXTREMITIES: No edema  BACK: Normal.  SKIN: Normal by inspection.  MENTAL STATUS: Alert and oriented. Speech, language and cognition are generally intact. Judgment and insight normal.  CRANIAL NERVES: Pupils are equal, round and reactive to light and accommodation; extra ocular movements are full, there is no significant nystagmus; visual fields are full; upper and lower facial muscles are normal in strength and symmetric, there is no flattening of the nasolabial folds; tongue is midline; uvula is midline; shoulder elevation is normal.  MOTOR: Normal tone, bulk and strength- Right upper and lower extremities; no pronator drift. Left upper extremity: Deltoid 4 minus/5, triceps 5 and handgrip 5. Left lower extremity: Hip flexion 3/5 And dorsiflexion 4-/5.  COORDINATION: Left finger to nose is normal, right finger to nose is normal, No rest tremor; no intention tremor; no postural tremor; no bradykinesia.  REFLEXES: Deep tendon reflexes are symmetrical and normal.  SENSATION: Reduced sensation involving the entire left Hemibody, neck and facial region.        Objective: Vital signs in last 24 hours: Temp:  [97.5 F (36.4 C)-97.8 F (36.6 C)] 97.8 F (36.6 C) (06/18 1442) Pulse Rate:   [52-65] 54 (06/18 1442) Resp:  [20] 20 (06/18 1442) BP: (120-130)/(60-81) 130/60 mmHg (06/18 1442) SpO2:  [94 %-96 %] 96 % (06/18 1442)  Intake/Output from previous day: 06/17 0701 - 06/18 0700 In: 538 [P.O.:480; IV Piggyback:58] Out: 2600 [Urine:2600] Intake/Output this shift:   Nutritional status: General   Lab Results: Results for orders placed during the hospital encounter of 04/28/14 (from the past 48 hour(s))  GLUCOSE, CAPILLARY     Status: Abnormal   Collection Time    04/29/14  9:59 PM      Result Value Ref Range   Glucose-Capillary 124 (*) 70 - 99 mg/dL  CBC     Status: Abnormal   Collection Time    04/30/14  6:05 AM      Result Value Ref Range   WBC 23.7 (*) 4.0 - 10.5 K/uL   RBC 4.68  3.87 - 5.11 MIL/uL   Hemoglobin 13.7  12.0 - 15.0 g/dL   HCT 40.9  36.0 - 46.0 %   MCV 87.4  78.0 - 100.0 fL   MCH 29.3  26.0 - 34.0 pg   MCHC 33.5  30.0 - 36.0 g/dL   RDW 13.9  11.5 - 15.5 %   Platelets 349  150 - 400 K/uL  BASIC METABOLIC PANEL     Status: Abnormal   Collection Time    04/30/14  6:05 AM      Result Value Ref Range   Sodium 147  137 - 147 mEq/L   Potassium 4.4  3.7 - 5.3 mEq/L   Chloride 107  96 - 112 mEq/L   CO2 29  19 - 32 mEq/L   Glucose, Bld 143 (*) 70 - 99 mg/dL   BUN 17  6 - 23 mg/dL   Creatinine, Ser 0.83  0.50 - 1.10 mg/dL   Calcium 9.2  8.4 - 10.5 mg/dL   GFR calc non Af Amer 80 (*) >90 mL/min   GFR calc Af Amer >90  >90 mL/min   Comment: (NOTE)     The eGFR has been calculated using the CKD EPI equation.     This calculation has not been validated in all clinical situations.     eGFR's persistently <90 mL/min signify possible Chronic Kidney     Disease.  GLUCOSE, CAPILLARY     Status: Abnormal   Collection Time    04/30/14  7:36 AM      Result Value Ref Range   Glucose-Capillary 122 (*) 70 - 99 mg/dL   Comment 1 Notify RN    GLUCOSE, CAPILLARY     Status: Abnormal   Collection Time    04/30/14 11:06 AM      Result Value Ref Range    Glucose-Capillary 125 (*) 70 - 99 mg/dL  GLUCOSE, CAPILLARY     Status: Abnormal   Collection Time    04/30/14  4:43 PM      Result Value Ref Range   Glucose-Capillary 135 (*) 70 - 99 mg/dL   Comment 1 Notify RN    GLUCOSE, CAPILLARY     Status: Abnormal   Collection Time    04/30/14  9:08 PM      Result Value Ref Range   Glucose-Capillary 148 (*) 70 - 99 mg/dL   Comment 1 Notify RN     Comment 2 Documented in Chart    CBC     Status: Abnormal   Collection Time    05/01/14  6:06 AM      Result Value Ref Range   WBC 18.3 (*) 4.0 - 10.5 K/uL   RBC 4.65  3.87 - 5.11 MIL/uL   Hemoglobin 13.6  12.0 - 15.0 g/dL   HCT 41.1  36.0 - 46.0 %   MCV 88.4  78.0 - 100.0 fL   MCH 29.2  26.0 - 34.0 pg   MCHC 33.1  30.0 - 36.0 g/dL   RDW 13.9  11.5 - 15.5 %   Platelets 333  150 - 400 K/uL  BASIC METABOLIC PANEL     Status: Abnormal   Collection Time    05/01/14  6:06 AM      Result Value Ref Range   Sodium 146  137 - 147 mEq/L   Potassium 4.6  3.7 - 5.3 mEq/L   Chloride 106  96 - 112 mEq/L   CO2 31  19 - 32 mEq/L   Glucose, Bld 125 (*) 70 - 99 mg/dL   BUN 19  6 - 23 mg/dL   Creatinine, Ser 0.90  0.50 - 1.10 mg/dL   Calcium 9.4  8.4 - 10.5 mg/dL   GFR calc non Af Amer 73 (*) >90 mL/min   GFR calc Af Amer 84 (*) >90 mL/min   Comment: (NOTE)     The eGFR has been calculated using the CKD EPI equation.     This calculation has not been validated in all clinical situations.     eGFR's persistently <90 mL/min signify possible Chronic Kidney     Disease.  GLUCOSE, CAPILLARY     Status: Abnormal  Collection Time    05/01/14  8:02 AM      Result Value Ref Range   Glucose-Capillary 106 (*) 70 - 99 mg/dL   Comment 1 Notify RN    GLUCOSE, CAPILLARY     Status: Abnormal   Collection Time    05/01/14 11:44 AM      Result Value Ref Range   Glucose-Capillary 124 (*) 70 - 99 mg/dL   Comment 1 Notify RN    GLUCOSE, CAPILLARY     Status: Abnormal   Collection Time    05/01/14  5:03 PM       Result Value Ref Range   Glucose-Capillary 156 (*) 70 - 99 mg/dL   Comment 1 Notify RN      Lipid Panel No results found for this basename: CHOL, TRIG, HDL, CHOLHDL, VLDL, LDLCALC,  in the last 72 hours  Studies/Results: No results found.  Medications:  Scheduled Meds: . enoxaparin (LOVENOX) injection  50 mg Subcutaneous Q24H  . insulin aspart  0-15 Units Subcutaneous TID WC  . insulin aspart  0-5 Units Subcutaneous QHS  . methylPREDNISolone (SOLU-MEDROL) injection  1,000 mg Intravenous Daily  . [START ON 05/02/2014] pantoprazole  40 mg Oral Daily  . polyethylene glycol  17 g Oral Daily  . sodium chloride  3 mL Intravenous Q12H   Continuous Infusions:  PRN Meds:.sodium chloride, acetaminophen, acetaminophen, albuterol, aspirin-acetaminophen-caffeine, HYDROcodone-acetaminophen, LORazepam, magnesium citrate, ondansetron (ZOFRAN) IV, ondansetron, sodium chloride     LOS: 3 days   Kofi A. [name], M.D.  Diplomate, Tax adviser of Psychiatry and Neurology ( Neurology).

## 2014-05-01 NOTE — Progress Notes (Signed)
The patient is receiving Protonix  by the intravenous route.  Based on criteria approved by the Pharmacy and [place], the medication is being converted to the equivalent oral dose form.  These criteria include: -No Active GI bleeding -Able to tolerate diet of full liquids (or better) or tube feeding OR able to tolerate other medications by the oral or enteral route  If you have any questions about this conversion, please contact the Pharmacy Department (ext [phone]).  Thank you.  Biagio Borg, [facility] 05/01/2014 2:03 PM

## 2014-05-02 DIAGNOSIS — E669 Obesity, unspecified: Secondary | ICD-10-CM

## 2014-05-02 DIAGNOSIS — R930 Abnormal findings on diagnostic imaging of skull and head, not elsewhere classified: Secondary | ICD-10-CM

## 2014-05-02 LAB — CBC
HCT: 40 % (ref 36.0–46.0)
Hemoglobin: 13.6 g/dL (ref 12.0–15.0)
MCH: 29.7 pg (ref 26.0–34.0)
MCHC: 34 g/dL (ref 30.0–36.0)
MCV: 87.3 fL (ref 78.0–100.0)
Platelets: 311 10*3/uL (ref 150–400)
RBC: 4.58 MIL/uL (ref 3.87–5.11)
RDW: 13.7 % (ref 11.5–15.5)
WBC: 13.2 10*3/uL — AB (ref 4.0–10.5)

## 2014-05-02 LAB — GLUCOSE, CAPILLARY: GLUCOSE-CAPILLARY: 136 mg/dL — AB (ref 70–99)

## 2014-05-02 NOTE — Discharge Instructions (Signed)

## 2014-05-02 NOTE — Discharge Summary (Signed)
Physician Discharge Summary  Kristine Miller ZWC:[id number] DOB: 03/05/1961 DOA: 04/28/2014  PCP: [name], MD  Admit date: 04/28/2014 Discharge date: 05/02/2014  Time spent: 45 minutes  Recommendations for Outpatient Follow-up:  Patient will be discharged home. She is to follow up with Dr. [name] within 6 weeks. Patient should also follow up with her primary care physician within one to 2 weeks of discharge. Patient to continue her medications as prescribed. No dietary or physical restrictions.  Discharge Diagnoses:  Principal Problem:   Multiple sclerosis exacerbation Active Problems:   Obesity   Anxiety state, unspecified   Hemiplegia affecting left nondominant side   MS (multiple sclerosis)   Steroid-induced hyperglycemia  Discharge Condition: Stable  Diet recommendation: Regular  Filed Weights   04/28/14 1556 04/28/14 2300  Weight: 102.513 kg (226 lb) 102.7 kg (226 lb 6.6 oz)    History of present illness:  Kristine Miller is a 53 y.o. female with a past medical history of obesity, multiple sclerosis diagnosed in 2014, who was in her usual state of health over the past 1 week when she started experiencing generalized weakness. She had been out cutting grass in the heat and then yesterday she presented to the ED with complaints of chest pain, but was also experiencing left-sided numbness including her head, neck, left arm and left leg. She was given high dose of steroids in the ED, and was lost to followup with her neurologist. Over the course of the night the symptoms worsened and this morning when she woke up she felt her left legs to be extremely weak and couldn't walk as a result. She mentioned that she was diagnosed with MS in October of 2014. She is not taking any immune therapy for the same nor is she taking any other medications. As a result of the MS, she has noticed changes in her speech and has also noticed some difficulty with swallowing solid foods. She denied any  fever or chills recently. She does admit exposure to heat in the last couple days. Denids any dysuria, hematuria. No shortness of breath. She had chest pain yesterday, but none at admission. She felt as if her heart is racing. She described the left leg has been very heavy. She also has had left-sided headache for the last 2, days. Denied any recent changes to her medications. She called her neurologist and was asked to come in to the emergency department and be admitted.  Hospital Course:  Multiple sclerosis exacerbation in a patient with known multiple sclerosis / hemiplegia affecting left nondominant side  Patient received a five-day course of IV Solu-Medrol as recommended by neurology. Status post MRI of the brain which showed stable lesions.  Physical therapy consulted for gait training. Seen by ST, no further F/U indicated.  Will need to consider outpatient immunomodulating therapy, this should be discussed with neurology as an outpatient. Patient will need to follow up with Dr. [name] within 6 weeks of discharge.  Obesity  Encouraged ongoing weight loss efforts.  Patient should discuss a weight loss program as well as exercise program with her primary care physician.  Anxiety state, unspecified  Continue Ativan every 6 hours as needed.  Steroid-induced Hyperglycemia  Hemoglobin A1c 5.6%.  Patient was placed on insulin sliding scale while hospitalized. Her CBGs remained between 120s to 150s.  Procedures: None  Consultations: Dr. Phillips Odor, Neurology  Discharge Exam: Filed Vitals:   05/02/14 0424  BP: 132/67  Pulse: 56  Temp: 97.7 F (36.5 C)  Resp:  20     General: Well developed, well nourished, NAD, appears stated age  HEENT: NCAT, PERRLA, EOMI, Anicteic Sclera, mucous membranes moist.  Neck: Supple, no JVD, no masses  Cardiovascular: S1 S2 auscultated, no rubs, murmurs or gallops. Regular rate and rhythm.  Respiratory: Clear to auscultation bilaterally with  equal chest rise  Abdomen: Soft, obese, nontender, nondistended, + bowel sounds  Extremities: warm dry without cyanosis clubbing or edema  Neuro: AAOx3, cranial nerves grossly intact. Strength 5/5 in RUE and RLE, 4/5 in LLE and LUE  Skin: Without rashes exudates or nodules  Psych: Normal affect and demeanor with intact judgement and insight  Discharge Instructions      Discharge Instructions   Discharge instructions    Complete by:  As directed   Patient will be discharged home. She is to follow up with Dr. [name] within 6 weeks. Patient should also follow up with her primary care physician within one to 2 weeks of discharge. Patient to continue her medications as prescribed. No dietary or physical restrictions.            Medication List         aspirin EC 81 MG tablet  Take 162 mg by mouth once as needed for moderate pain.     aspirin-acetaminophen-caffeine 604-540-98 MG per tablet  Commonly known as:  EXCEDRIN MIGRAINE  Take 2 tablets by mouth every 6 (six) hours as needed for headache.     calcium-vitamin D 500-200 MG-UNIT per tablet  Commonly known as:  OSCAL WITH D  Take 1 tablet by mouth every morning.       Allergies  Allergen Reactions  . Morphine And Related Swelling  . Penicillins Nausea And Vomiting   Follow-up Information   Follow up with LUKING,SCOTT, MD. Schedule an appointment as soon as possible for a visit in 1 week. [facility] followup)    Specialty:  Family Medicine   Contact information:   [place] [address] [phone]       Follow up with [facility], KOFI, MD. Schedule an appointment as soon as possible for a visit in 6 weeks. [facility] followup)    Specialty:  Neurology   Contact information:   [address] [place] [id number] [phone]        The results of significant diagnostics from this hospitalization (including imaging, microbiology, ancillary and laboratory) are listed below for  reference.    Significant Diagnostic Studies: Ct Head Wo Contrast  04/27/2014   CLINICAL DATA:  Multiple sclerosis. Numbness in the left side of the face. History of multiple sclerosis.  EXAM: CT HEAD WITHOUT CONTRAST  TECHNIQUE: Contiguous axial images were obtained from the base of the skull through the vertex without intravenous contrast.  COMPARISON:  CT head 08/19/2013.  MRI brain 08/19/2013.  FINDINGS: No mass lesion, mass effect, midline shift, hydrocephalus, hemorrhage. No territorial ischemia or acute infarction. Scattered areas of periventricular low attenuation are present compatible with stated clinical history of multiple sclerosis and correlating with white matter lesions identified on prior examination.  IMPRESSION: No acute intracranial abnormality. Scattered periventricular white-matter lesion compatible with history of multiple sclerosis.   Electronically Signed   By: Dereck Ligas M.D.   On: 04/27/2014 22:03   Mr Jeri Cos XL Contrast  04/28/2014   CLINICAL DATA:  Multiple sclerosis. Woke up with left leg weakness and inability to bear weight.  EXAM: MRI HEAD WITHOUT AND WITH CONTRAST  TECHNIQUE: Multiplanar, multiecho pulse sequences of  the brain and surrounding structures were obtained without and with intravenous contrast.  CONTRAST:  38mL MULTIHANCE GADOBENATE DIMEGLUMINE 529 MG/ML IV SOLN  COMPARISON:  MRI brain 08/19/2013. MRI cervical spine today reported separately.  FINDINGS: No evidence for acute infarction, hemorrhage, mass lesion, hydrocephalus, or extra-axial fluid. Normal for age cerebral volume. No large vessel or lacunar infarcts.  No diffusion abnormality or postcontrast enhancement to suggest an acute white matter lesion. There are scattered subcortical and periventricular signal abnormalities, largely subcentimeter in size, scattered throughout the supratentorial region, sparing the brainstem and cerebellum. No convincing involvement of the callosal-septal interface. No  new lesions since October 2014.  Pituitary, pineal, and cerebellar tonsils unremarkable. No upper cervical lesions. Flow voids are maintained throughout the carotid, basilar, and vertebral arteries. There are no areas of chronic hemorrhage. Visualized calvarium, skull base, and upper cervical osseous structures unremarkable. Scalp and extracranial soft tissues, orbits, sinuses, and mastoids show no acute process. Incidental sebaceous cyst in the frontal scalp.  IMPRESSION: Stable nonspecific white matter signal abnormality. In the appropriate clinical setting these could be consistent with the diagnosis of multiple sclerosis, but there are no worrisome features, such as diffusion abnormality or postcontrast enhancement on today's exam, and no definite progression since 2014.   Electronically Signed   By: Rolla Flatten M.D.   On: 04/28/2014 19:17   Mr Cervical Spine W Wo Contrast  04/28/2014   CLINICAL DATA:  Diagnosed with MS. along with left leg weakness.  EXAM: MRI CERVICAL SPINE WITHOUT AND WITH CONTRAST  TECHNIQUE: Multiplanar and multiecho pulse sequences of the cervical spine, to include the craniocervical junction and cervicothoracic junction, were obtained according to standard protocol without and with intravenous contrast.  CONTRAST:  MultiHance 20 mL.  COMPARISON:  None.  FINDINGS: No evidence for disc degeneration, disc herniation, vertebral body abnormality or paraspinous mass.  Cord is normal size and signal throughout the cervical spine. Negative visualized upper thoracic spinal cord.  No osseous findings. Cervicomedullary junction within normal limits. No abnormal enhancement postcontrast.  Unchanged appearance from prior normal study.  IMPRESSION: Negative cervical spine MRI without and with contrast.   Electronically Signed   By: Rolla Flatten M.D.   On: 04/28/2014 19:34    Microbiology: No results found for this or any previous visit (from the past 240 hour(s)).   Labs: Basic Metabolic  Panel:  Recent Labs Lab 04/27/14 2051 04/28/14 1631 04/29/14 0554 04/30/14 0605 05/01/14 0606  NA 144 142 145 147 146  K 4.0 4.2 4.8 4.4 4.6  CL 105 102 107 107 106  CO2 25 23 26 29 31   GLUCOSE 104* 166* 195* 143* 125*  BUN 13 10 13 17 19   CREATININE 0.90 0.79 0.80 0.83 0.90  CALCIUM 9.2 10.0 9.4 9.2 9.4   Liver Function Tests:  Recent Labs Lab 04/27/14 2051 04/29/14 0554  AST 15 12  ALT 13 12  ALKPHOS 82 76  BILITOT 0.2* <0.2*  PROT 7.0 6.5  ALBUMIN 3.6 3.3*   No results found for this basename: LIPASE, AMYLASE,  in the last 168 hours No results found for this basename: AMMONIA,  in the last 168 hours CBC:  Recent Labs Lab 04/27/14 2051 04/28/14 1631 04/29/14 0554 04/30/14 0605 05/01/14 0606 05/02/14 0551  WBC 10.9* 16.7* 20.1* 23.7* 18.3* 13.2*  NEUTROABS 6.4 15.3*  --   --   --   --   HGB 14.2 15.3* 13.8 13.7 13.6 13.6  HCT 41.7 43.6 41.1 40.9 41.1 40.0  MCV  86.5 85.8 88.2 87.4 88.4 87.3  PLT 334 372 375 349 333 311   Cardiac Enzymes:  Recent Labs Lab 04/27/14 2051  TROPONINI <0.30   BNP: BNP (last 3 results) No results found for this basename: PROBNP,  in the last 8760 hours CBG:  Recent Labs Lab 05/01/14 0802 05/01/14 1144 05/01/14 1703 05/01/14 2037 05/02/14 0745  GLUCAP 106* 124* 156* 200* 136*       Signed:  MIKHAIL, MARYANN  [facility] 05/02/2014, 11:12 AM

## 2014-05-02 NOTE — Progress Notes (Signed)
IV removed, site WNL.  Pt given d/c instructions and discussed all home medications (when, how, and why to take), patient verbalizes understanding. Discussed home care with patient, teachback completed. F/U appointment in place, pt states they will keep appointment. Pt is stable at this time. Pt taken to main entrance in wheelchair by staff member.

## 2014-05-02 NOTE — Progress Notes (Signed)
PT Cancellation Note  Patient Details Name: Kristine Miller MRN: [id number] DOB: 1961-08-09   Cancelled Treatment:    Reason Eval/Treat Not Completed: Other (comment) (Pt being discharged today. ) Spoke with pt/husband, no questions for PT at this time.  Pt reports she has been practicing her exercises, which were provided in handout by PT everyday.  Pt reports she notices some increasing strength in the foot, though she still has difficulty lifting her leg onto the bed/clearing for gait (secondary to hip flexor weakness).   Educated pt to continue with HEP at home in moderation,and increase as tolerated.    WOODWORTH,STEPHANIE 05/02/2014, 11:27 AM

## 2014-05-08 ENCOUNTER — Ambulatory Visit (INDEPENDENT_AMBULATORY_CARE_PROVIDER_SITE_OTHER): Payer: Self-pay | Admitting: Family Medicine

## 2014-05-08 ENCOUNTER — Encounter: Payer: Self-pay | Admitting: Family Medicine

## 2014-05-08 VITALS — BP 118/72 | Ht 60.0 in | Wt 224.0 lb

## 2014-05-08 DIAGNOSIS — G35 Multiple sclerosis: Secondary | ICD-10-CM

## 2014-05-08 NOTE — Progress Notes (Signed)
   Subjective:    Patient ID: Kristine Miller, female    DOB: 02/26/1961, 53 y.o.   MRN: [id number]  HPIFollow up hospital stay for MS.  Discharged on Friday. Patient feeling better.   Has no other  concerns today.   Notes from the hospital was reviewed in detail. Patient relates left-sided weakness persists but not as severe as what was denies any fevers or complications currently she is watching her diet tries keep her sugars under control  Review of Systems     Objective:   Physical Exam Lungs clear heart regular patient does have significant weakness on the left side of her body.       Assessment & Plan:  In S.-recent severe flare up. I believe this patient would benefit from going ahead and being seen by MS specialist. She has local neurologist. We have discussed her case. She would like to be seen with a specialist. We will help set this up.  She will followup in October for further her A1c

## 2014-05-15 ENCOUNTER — Encounter: Payer: Self-pay | Admitting: Family Medicine

## 2014-06-20 DIAGNOSIS — Z0289 Encounter for other administrative examinations: Secondary | ICD-10-CM

## 2014-09-15 ENCOUNTER — Encounter: Payer: Self-pay | Admitting: Family Medicine

## 2015-01-12 DIAGNOSIS — Z029 Encounter for administrative examinations, unspecified: Secondary | ICD-10-CM

## 2015-03-31 ENCOUNTER — Emergency Department (HOSPITAL_COMMUNITY): Payer: Self-pay

## 2015-03-31 ENCOUNTER — Encounter (HOSPITAL_COMMUNITY): Payer: Self-pay | Admitting: Emergency Medicine

## 2015-03-31 ENCOUNTER — Emergency Department (HOSPITAL_COMMUNITY)
Admission: EM | Admit: 2015-03-31 | Discharge: 2015-03-31 | Disposition: A | Discharge status: Home / Self Care | Payer: Self-pay | Attending: Emergency Medicine | Admitting: Emergency Medicine

## 2015-03-31 ENCOUNTER — Emergency Department (HOSPITAL_COMMUNITY): Payer: Medicaid Other

## 2015-03-31 DIAGNOSIS — G8929 Other chronic pain: Secondary | ICD-10-CM | POA: Insufficient documentation

## 2015-03-31 DIAGNOSIS — R52 Pain, unspecified: Secondary | ICD-10-CM

## 2015-03-31 DIAGNOSIS — Z8719 Personal history of other diseases of the digestive system: Secondary | ICD-10-CM | POA: Insufficient documentation

## 2015-03-31 DIAGNOSIS — F419 Anxiety disorder, unspecified: Secondary | ICD-10-CM | POA: Insufficient documentation

## 2015-03-31 DIAGNOSIS — J45909 Unspecified asthma, uncomplicated: Secondary | ICD-10-CM | POA: Insufficient documentation

## 2015-03-31 DIAGNOSIS — R079 Chest pain, unspecified: Secondary | ICD-10-CM | POA: Insufficient documentation

## 2015-03-31 DIAGNOSIS — Z8701 Personal history of pneumonia (recurrent): Secondary | ICD-10-CM | POA: Insufficient documentation

## 2015-03-31 DIAGNOSIS — Z79899 Other long term (current) drug therapy: Secondary | ICD-10-CM | POA: Insufficient documentation

## 2015-03-31 DIAGNOSIS — Z8739 Personal history of other diseases of the musculoskeletal system and connective tissue: Secondary | ICD-10-CM | POA: Insufficient documentation

## 2015-03-31 LAB — COMPREHENSIVE METABOLIC PANEL
ALBUMIN: 3.8 g/dL (ref 3.5–5.0)
ALK PHOS: 85 U/L (ref 38–126)
ALT: 16 U/L (ref 14–54)
AST: 20 U/L (ref 15–41)
Anion gap: 8 (ref 5–15)
BUN: 7 mg/dL (ref 6–20)
CO2: 27 mmol/L (ref 22–32)
Calcium: 9.1 mg/dL (ref 8.9–10.3)
Chloride: 107 mmol/L (ref 101–111)
Creatinine, Ser: 0.82 mg/dL (ref 0.44–1.00)
GFR calc non Af Amer: >60 mL/min (ref >60)
GLUCOSE: 105 mg/dL — AB (ref 65–99)
POTASSIUM: 3.5 mmol/L (ref 3.5–5.1)
SODIUM: 142 mmol/L (ref 135–145)
Total Bilirubin: 0.5 mg/dL (ref 0.3–1.2)
Total Protein: 7 g/dL (ref 6.5–8.1)

## 2015-03-31 LAB — CBC WITH DIFFERENTIAL/PLATELET
Basophils Absolute: 0 10*3/uL (ref 0.0–0.1)
Basophils Relative: 0 % (ref 0–1)
EOS PCT: 2 % (ref 0–5)
Eosinophils Absolute: 0.2 10*3/uL (ref 0.0–0.7)
HCT: 42 % (ref 36.0–46.0)
Hemoglobin: 14 g/dL (ref 12.0–15.0)
Lymphocytes Relative: 37 % (ref 12–46)
Lymphs Abs: 3.7 10*3/uL (ref 0.7–4.0)
MCH: 29.4 pg (ref 26.0–34.0)
MCHC: 33.3 g/dL (ref 30.0–36.0)
MCV: 88.2 fL (ref 78.0–100.0)
MONO ABS: 0.7 10*3/uL (ref 0.1–1.0)
Monocytes Relative: 7 % (ref 3–12)
Neutro Abs: 5.4 10*3/uL (ref 1.7–7.7)
Neutrophils Relative %: 54 % (ref 43–77)
PLATELETS: 332 10*3/uL (ref 150–400)
RBC: 4.76 MIL/uL (ref 3.87–5.11)
RDW: 13.2 % (ref 11.5–15.5)
WBC: 10 10*3/uL (ref 4.0–10.5)

## 2015-03-31 LAB — TROPONIN I

## 2015-03-31 LAB — D-DIMER, QUANTITATIVE (NOT AT ARMC): D-Dimer, Quant: 1.33 ug/mL-FEU — ABNORMAL HIGH (ref 0.00–0.48)

## 2015-03-31 MED ORDER — IOHEXOL 350 MG/ML SOLN
100.0000 mL | Freq: Once | INTRAVENOUS | Status: AC | PRN
  Administered 2015-03-31: 100 mL via INTRAVENOUS

## 2015-03-31 MED ORDER — ASPIRIN 81 MG PO CHEW
324.0000 mg | CHEWABLE_TABLET | Freq: Once | ORAL | Status: AC
  Administered 2015-03-31: 324 mg via ORAL

## 2015-03-31 MED ORDER — ASPIRIN 81 MG PO CHEW
CHEWABLE_TABLET | ORAL | Status: DC
Start: 2015-03-31 — End: 2015-04-01
  Filled 2015-03-31: qty 4

## 2015-03-31 MED ORDER — TRAMADOL HCL 50 MG PO TABS: 50.0000 mg | ORAL_TABLET | Freq: Four times a day (QID) | ORAL | Status: DC | PRN

## 2015-03-31 NOTE — ED Notes (Signed)
Notified Dr. Roderic Palau that patient states the swelling of her L hand is unusual and she has never had this happen before.

## 2015-03-31 NOTE — ED Notes (Signed)
Patient with continued L chest pain complaints at this time. Respirations even and unlabored. Skin warm/dry. Discharge instructions reviewed with patient at this time. Patient given opportunity to voice concerns/ask questions. IV removed per policy and band-aid applied to site. Patient discharged at this time and left Emergency Department with steady gait.

## 2015-03-31 NOTE — ED Provider Notes (Signed)
CSN: [id number]     Arrival date & time 03/31/15  1619 History   First MD Initiated Contact with Patient 03/31/15 1632     Chief Complaint  Patient presents with  . Chest Pain     (Consider location/radiation/quality/duration/timing/severity/associated sxs/prior Treatment) Patient is a 54 y.o. female presenting with chest pain. The history is provided by the patient (the pt states she has chest pain.  worse with inspiration and movement).  Chest Pain Pain location:  L chest Pain quality: aching   Radiates to: left arm. Pain radiates to the back: no   Pain severity:  Moderate Onset quality:  Sudden Timing:  Constant Progression:  Waxing and waning Chronicity:  New Context: breathing   Associated symptoms: no abdominal pain, no back pain, no cough, no fatigue and no headache     Past Medical History  Diagnosis Date  . Rotator cuff arthropathy, left   . IBS (irritable bowel syndrome)   . Pneumonia   . Asthma   . Palpitations   . Anxiety   . MS (multiple sclerosis)   . Chronic headaches    Past Surgical History  Procedure Laterality Date  . Abdominal hysterectomy    . Rotator cuff repair    . Cholecystectomy    . Appendectomy    . Cesarean section     Family History  Problem Relation Age of Onset  . Cancer Mother   . Diabetes Mother   . Diabetes type I Daughter    History  Substance Use Topics  . Smoking status: Never Smoker   . Smokeless tobacco: Never Used  . Alcohol Use: No   OB History    Gravida Para Term Preterm AB TAB SAB Ectopic Multiple Living   5 2 2  3  3   2      Review of Systems  Constitutional: Negative for appetite change and fatigue.  HENT: Negative for congestion, ear discharge and sinus pressure.   Eyes: Negative for discharge.  Respiratory: Negative for cough.   Cardiovascular: Positive for chest pain.  Gastrointestinal: Negative for abdominal pain and diarrhea.  Genitourinary: Negative for frequency and hematuria.  Musculoskeletal:  Negative for back pain.  Skin: Negative for rash.  Neurological: Negative for seizures and headaches.  Psychiatric/Behavioral: Negative for hallucinations.      Allergies  Morphine and related and Penicillins  Home Medications   Prior to Admission medications   Medication Sig Start Date End Date Taking? Authorizing Provider  traMADol (ULTRAM) 50 MG tablet Take 1 tablet (50 mg total) by mouth every 6 (six) hours as needed. 03/31/15   Milton Ferguson, MD   BP 125/55 mmHg  Pulse 72  Temp(Src) 98.1 F (36.7 C) (Oral)  Resp 23  Ht 5' (1.524 m)  Wt 231 lb (104.781 kg)  BMI 45.11 kg/m2  SpO2 93% Physical Exam  Constitutional: She is oriented to person, place, and time. She appears well-developed.  HENT:  Head: Normocephalic.  Eyes: Conjunctivae and EOM are normal. No scleral icterus.  Neck: Neck supple. No thyromegaly present.  Cardiovascular: Normal rate and regular rhythm.  Exam reveals no gallop and no friction rub.   No murmur heard. Pulmonary/Chest: No stridor. She has no wheezes. She has no rales. She exhibits tenderness.  Tender left wrist  Abdominal: She exhibits no distension. There is no tenderness. There is no rebound.  Musculoskeletal: Normal range of motion. She exhibits no edema.  Lymphadenopathy:    She has no cervical adenopathy.  Neurological: She is oriented  to person, place, and time. She exhibits normal muscle tone. Coordination normal.  Skin: No rash noted. No erythema.  Psychiatric: She has a normal mood and affect. Her behavior is normal.    ED Course  Procedures (including critical care time) Labs Review Labs Reviewed  COMPREHENSIVE METABOLIC PANEL - Abnormal; Notable for the following:    Glucose, Bld 105 (*)    All other components within normal limits  D-DIMER, QUANTITATIVE - Abnormal; Notable for the following:    D-Dimer, Quant 1.33 (*)    All other components within normal limits  CBC WITH DIFFERENTIAL/PLATELET  TROPONIN I  TROPONIN I     Imaging Review Dg Chest 2 View  03/31/2015   CLINICAL DATA:  Left-sided chest pain radiating into left arm.  EXAM: CHEST - 2 VIEW  COMPARISON:  08/18/2013  FINDINGS: The heart size and mediastinal contours are within normal limits. Mild bibasilar atelectasis present. There is no evidence of pulmonary edema, consolidation, pneumothorax, nodule or pleural fluid. The thoracic spine demonstrates mild and diffuse spondylosis.  IMPRESSION: No active disease.   Electronically Signed   By: Aletta Edouard M.D.   On: 03/31/2015 17:05   Ct Angio Chest Pe W/cm &/or Wo Cm  03/31/2015   CLINICAL DATA:  Left side cp that radiates down left arm with nausea and sob started last night; hx ms  EXAM: CT ANGIOGRAPHY CHEST WITH CONTRAST  TECHNIQUE: Multidetector CT imaging of the chest was performed using the standard protocol during bolus administration of intravenous contrast. Multiplanar CT image reconstructions and MIPs were obtained to evaluate the vascular anatomy.  CONTRAST:  124mL OMNIPAQUE IOHEXOL 350 MG/ML SOLN  COMPARISON:  08/19/2013  FINDINGS: right arm contrast injection. svc patent. satisfactory opacification of pulmonary arteries noted, and there is no evidence of pulmonary emboli. patent bilateral pulmonary veins. Adequate contrast opacification of the thoracic aorta with no evidence of dissection, aneurysm, or stenosis. There is classic 3-vessel brachiocephalic arch anatomy without proximal stenosis. no significant atheromatous irregularity.  No pleural or pericardial effusion. No hilar or mediastinal adenopathy. Low lung volumes. Lungs are clear. Spurring in the mid and lower thoracic spine. Sternum intact. Surgical clips in the gallbladder fossa. Remainder visualized upper abdomen unremarkable.  Review of the MIP images confirms the above findings.  IMPRESSION: 1. Negative for acute PE or thoracic aortic dissection.   Electronically Signed   By: Lucrezia Europe M.D.   On: 03/31/2015 18:26     EKG  Interpretation   Date/Time:  Tuesday Mar 31 2015 16:28:16 EDT Ventricular Rate:  75 PR Interval:  160 QRS Duration: 95 QT Interval:  406 QTC Calculation: 453 R Axis:   -123 Text Interpretation:  Sinus rhythm Markedly posterior QRS axis Borderline  T abnormalities, diffuse leads Baseline wander in lead(s) V1 Confirmed by  Jamarie Joplin  MD, Kampbell Holaway ([id number]) on 03/31/2015 7:05:48 PM      MDM   Final diagnoses:  Pain  Chest pain at rest    Chest pain,  Normal studies.  Pt rx with ultram and follow up with pcp this week.   Milton Ferguson, MD 03/31/15 2105

## 2015-03-31 NOTE — ED Notes (Signed)
Patient reports intermittent vertigo since arrival. States worse with eye or head movement. She has had similar episodes in past along with brief periods of tinnitus.  Dr. Roderic Palau made aware.

## 2015-03-31 NOTE — ED Notes (Signed)
PT c/o left sided chest pain that radiates down her left arm starting last night with some nausea. PT also report some swelling to left hand and SOB on exertion.

## 2015-03-31 NOTE — Discharge Instructions (Signed)
Follow up with your family md this week for recheck

## 2016-03-16 ENCOUNTER — Encounter (HOSPITAL_COMMUNITY): Payer: Self-pay | Admitting: Emergency Medicine

## 2016-03-16 ENCOUNTER — Emergency Department (HOSPITAL_COMMUNITY): Payer: Medicaid Other

## 2016-03-16 ENCOUNTER — Inpatient Hospital Stay (HOSPITAL_COMMUNITY)
Admission: EM | Admit: 2016-03-16 | Discharge: 2016-03-21 | DRG: 059 | Disposition: A | Discharge status: Home / Self Care | Payer: Medicaid Other | Attending: Internal Medicine | Admitting: Internal Medicine

## 2016-03-16 DIAGNOSIS — R531 Weakness: Secondary | ICD-10-CM | POA: Diagnosis present

## 2016-03-16 DIAGNOSIS — R079 Chest pain, unspecified: Secondary | ICD-10-CM | POA: Diagnosis present

## 2016-03-16 DIAGNOSIS — G35 Multiple sclerosis: Secondary | ICD-10-CM | POA: Diagnosis not present

## 2016-03-16 DIAGNOSIS — E876 Hypokalemia: Secondary | ICD-10-CM | POA: Diagnosis present

## 2016-03-16 DIAGNOSIS — G35D Multiple sclerosis, unspecified: Secondary | ICD-10-CM | POA: Diagnosis present

## 2016-03-16 DIAGNOSIS — M6289 Other specified disorders of muscle: Secondary | ICD-10-CM

## 2016-03-16 DIAGNOSIS — R0789 Other chest pain: Secondary | ICD-10-CM | POA: Diagnosis present

## 2016-03-16 DIAGNOSIS — Z6841 Body Mass Index (BMI) 40.0 and over, adult: Secondary | ICD-10-CM

## 2016-03-16 DIAGNOSIS — R200 Anesthesia of skin: Secondary | ICD-10-CM

## 2016-03-16 LAB — I-STAT CHEM 8, ED
BUN: 16 mg/dL (ref 6–20)
CALCIUM ION: 1.12 mmol/L (ref 1.12–1.23)
CHLORIDE: 102 mmol/L (ref 101–111)
CREATININE: 0.8 mg/dL (ref 0.44–1.00)
GLUCOSE: 122 mg/dL — AB (ref 65–99)
HCT: 46 % (ref 36.0–46.0)
Hemoglobin: 15.6 g/dL — ABNORMAL HIGH (ref 12.0–15.0)
Potassium: 3.3 mmol/L — ABNORMAL LOW (ref 3.5–5.1)
Sodium: 141 mmol/L (ref 135–145)
TCO2: 28 mmol/L (ref 0–100)

## 2016-03-16 LAB — DIFFERENTIAL
BASOS ABS: 0 10*3/uL (ref 0.0–0.1)
Basophils Relative: 0 %
Eosinophils Absolute: 0.1 10*3/uL (ref 0.0–0.7)
Eosinophils Relative: 1 %
LYMPHS ABS: 3.9 10*3/uL (ref 0.7–4.0)
Lymphocytes Relative: 30 %
MONO ABS: 1 10*3/uL (ref 0.1–1.0)
MONOS PCT: 8 %
NEUTROS ABS: 7.8 10*3/uL — AB (ref 1.7–7.7)
Neutrophils Relative %: 61 %

## 2016-03-16 LAB — CBC
HCT: 42.5 % (ref 36.0–46.0)
HCT: 41.4 % (ref 36.0–46.0)
Hemoglobin: 14.3 g/dL (ref 12.0–15.0)
Hemoglobin: 13.4 g/dL (ref 12.0–15.0)
MCH: 29.5 pg (ref 26.0–34.0)
MCH: 28.6 pg (ref 26.0–34.0)
MCHC: 33.6 g/dL (ref 30.0–36.0)
MCHC: 32.4 g/dL (ref 30.0–36.0)
MCV: 87.8 fL (ref 78.0–100.0)
MCV: 88.5 fL (ref 78.0–100.0)
PLATELETS: 337 10*3/uL (ref 150–400)
PLATELETS: 368 10*3/uL (ref 150–400)
RBC: 4.84 MIL/uL (ref 3.87–5.11)
RBC: 4.68 MIL/uL (ref 3.87–5.11)
RDW: 13.5 % (ref 11.5–15.5)
RDW: 13.8 % (ref 11.5–15.5)
WBC: 12.9 10*3/uL — ABNORMAL HIGH (ref 4.0–10.5)
WBC: 11.6 10*3/uL — AB (ref 4.0–10.5)

## 2016-03-16 LAB — BASIC METABOLIC PANEL
ANION GAP: 10 (ref 5–15)
BUN: 14 mg/dL (ref 6–20)
CALCIUM: 9 mg/dL (ref 8.9–10.3)
CO2: 29 mmol/L (ref 22–32)
CREATININE: 0.92 mg/dL (ref 0.44–1.00)
Chloride: 103 mmol/L (ref 101–111)
GFR calc Af Amer: >60 mL/min (ref >60)
GLUCOSE: 130 mg/dL — AB (ref 65–99)
Potassium: 3.3 mmol/L — ABNORMAL LOW (ref 3.5–5.1)
Sodium: 142 mmol/L (ref 135–145)

## 2016-03-16 LAB — TROPONIN I: Troponin I: <0.03 ng/mL (ref <0.031)

## 2016-03-16 LAB — URINE MICROSCOPIC-ADD ON
BACTERIA UA: NONE SEEN
WBC, UA: NONE SEEN WBC/hpf (ref 0–5)

## 2016-03-16 LAB — RAPID URINE DRUG SCREEN, HOSP PERFORMED
AMPHETAMINES: NOT DETECTED
Barbiturates: NOT DETECTED
Benzodiazepines: NOT DETECTED
COCAINE: NOT DETECTED
OPIATES: NOT DETECTED
TETRAHYDROCANNABINOL: NOT DETECTED

## 2016-03-16 LAB — URINALYSIS, ROUTINE W REFLEX MICROSCOPIC
BILIRUBIN URINE: NEGATIVE
GLUCOSE, UA: NEGATIVE mg/dL
Ketones, ur: NEGATIVE mg/dL
LEUKOCYTES UA: NEGATIVE
NITRITE: NEGATIVE
PH: 6 (ref 5.0–8.0)
Protein, ur: NEGATIVE mg/dL

## 2016-03-16 LAB — CREATININE, SERUM
CREATININE: 0.93 mg/dL (ref 0.44–1.00)
GFR calc Af Amer: >60 mL/min (ref >60)

## 2016-03-16 LAB — APTT: aPTT: 27 seconds (ref 24–37)

## 2016-03-16 LAB — PROTIME-INR
INR: 1 (ref 0.00–1.49)
PROTHROMBIN TIME: 13.4 s (ref 11.6–15.2)

## 2016-03-16 LAB — MAGNESIUM: Magnesium: 1.9 mg/dL (ref 1.7–2.4)

## 2016-03-16 LAB — D-DIMER, QUANTITATIVE: D-Dimer, Quant: 0.54 ug/mL-FEU — ABNORMAL HIGH (ref 0.00–0.50)

## 2016-03-16 LAB — ETHANOL: Alcohol, Ethyl (B): <5 mg/dL (ref <5)

## 2016-03-16 LAB — TSH: TSH: 5.175 u[IU]/mL — AB (ref 0.350–4.500)

## 2016-03-16 MED ORDER — ONDANSETRON HCL 4 MG/2ML IJ SOLN
4.0000 mg | Freq: Once | INTRAMUSCULAR | Status: AC
  Administered 2016-03-16: 4 mg via INTRAVENOUS
  Filled 2016-03-16: qty 2

## 2016-03-16 MED ORDER — ACETAMINOPHEN 650 MG RE SUPP: 650.0000 mg | Freq: Four times a day (QID) | RECTAL | Status: DC | PRN

## 2016-03-16 MED ORDER — HYDROMORPHONE HCL 1 MG/ML IJ SOLN: 1.0000 mg | INTRAMUSCULAR | Status: DC | PRN

## 2016-03-16 MED ORDER — POTASSIUM CHLORIDE CRYS ER 20 MEQ PO TBCR
20.0000 meq | EXTENDED_RELEASE_TABLET | Freq: Once | ORAL | Status: AC
  Administered 2016-03-16: 20 meq via ORAL
  Filled 2016-03-16: qty 1

## 2016-03-16 MED ORDER — ONDANSETRON HCL 4 MG/2ML IJ SOLN: 4.0000 mg | Freq: Four times a day (QID) | INTRAMUSCULAR | Status: DC | PRN

## 2016-03-16 MED ORDER — HYDROCODONE-ACETAMINOPHEN 5-325 MG PO TABS
1.0000 | ORAL_TABLET | ORAL | Status: DC | PRN
  Administered 2016-03-16: 1 via ORAL
  Administered 2016-03-16 – 2016-03-18 (×5): 2 via ORAL
  Filled 2016-03-16: qty 2
  Filled 2016-03-16: qty 1
  Filled 2016-03-16 (×4): qty 2

## 2016-03-16 MED ORDER — SODIUM CHLORIDE 0.9% FLUSH
3.0000 mL | Freq: Two times a day (BID) | INTRAVENOUS | Status: DC
  Administered 2016-03-16 – 2016-03-21 (×9): 3 mL via INTRAVENOUS

## 2016-03-16 MED ORDER — ENOXAPARIN SODIUM 40 MG/0.4ML ~~LOC~~ SOLN
40.0000 mg | SUBCUTANEOUS | Status: DC
  Administered 2016-03-16 – 2016-03-20 (×5): 40 mg via SUBCUTANEOUS
  Filled 2016-03-16 (×5): qty 0.4

## 2016-03-16 MED ORDER — POTASSIUM CHLORIDE CRYS ER 20 MEQ PO TBCR
40.0000 meq | EXTENDED_RELEASE_TABLET | ORAL | Status: AC
  Administered 2016-03-16 – 2016-03-17 (×2): 40 meq via ORAL
  Filled 2016-03-16 (×2): qty 2

## 2016-03-16 MED ORDER — ONDANSETRON HCL 4 MG PO TABS: 4.0000 mg | ORAL_TABLET | Freq: Four times a day (QID) | ORAL | Status: DC | PRN

## 2016-03-16 MED ORDER — SODIUM CHLORIDE 0.9 % IV SOLN
INTRAVENOUS | Status: DC
  Administered 2016-03-16 – 2016-03-18 (×3): via INTRAVENOUS

## 2016-03-16 MED ORDER — ACETAMINOPHEN 325 MG PO TABS: 650.0000 mg | ORAL_TABLET | Freq: Four times a day (QID) | ORAL | Status: DC | PRN

## 2016-03-16 NOTE — ED Notes (Signed)
Up to bedside commode. Complaining of nausea. No vomited at this time. Dr Lacinda Axon notified

## 2016-03-16 NOTE — ED Notes (Signed)
Pt reports she felt normal when she went to bed at 0200. Left sided chest pain woke her up at 0455. Described chest pain as sharp, left sided. Felt nauseated and SOB at that time. Left arm and left felt tingling and heavy. Has hx of MS and is not currently taking any medications for this. Per pt only essential oils. Noted to have weak grip strength in left hand compared to right hand. Has difficulty raising left leg. Left arm drift noted. Face symmetrical.  Husband at bedside

## 2016-03-16 NOTE — H&P (Signed)
History and Physical    Kristine Miller [id number] DOB: 04/09/1961 DOA: 03/16/2016  Referring MD/NP/PA: Dr. Lacinda Axon, ER PCP: [name], MD  Outpatient Specialists:  Patient coming from: home  Chief Complaint: left sided weakness  HPI: Kristine Miller is a 55 y.o. female with medical history significant of multiple sclerosis, presents to the ER with a variety of complaints. According to the patient, her symptoms began approximately 2 weeks ago. This started with increasing fatigue and somnolence. She subsequently developed left lower extremity weakness, when she noticed that her left foot was dragging when she was ambulating. After a few days, this progressed to bilateral lower extremity weakness. The patient did not seek medical attention, and her symptoms continued to progress. She reports that this morning, she woke up from sleep with complaints of a dull aching discomfort in her chest. She has associated heaviness on her chest with shortness of breath. She began feeling numbness in her left jaw area which radiated down her left arm to her elbow. Distal to her left elbow, she reported tingling and weakness. She presented to the ER for evaluation. She's not had any fever, cough, recent respiratory tract infection. She's not had any vomiting or diarrhea. When she was having her chest discomfort, she does describe shortness of breath, diaphoresis and lightheadedness.  ED Course: Patient was evaluated in the emergency room and initially code stroke was called. She was evaluated by tele neurology who did not feel that patient was a candidate for TPA. Admission for observation was recommended.  Review of Systems: Pertinent positives as per history of present illness, otherwise negative  Past Medical History  Diagnosis Date  . Rotator cuff arthropathy, left   . IBS (irritable bowel syndrome)   . Pneumonia   . Asthma   . Palpitations   . Anxiety   . MS (multiple sclerosis) ([place])   . Chronic  headaches     Past Surgical History  Procedure Laterality Date  . Abdominal hysterectomy    . Rotator cuff repair    . Cholecystectomy    . Appendectomy    . Cesarean section       reports that she has never smoked. She has never used smokeless tobacco. She reports that she does not drink alcohol or use illicit drugs.  Allergies  Allergen Reactions  . Morphine And Related Swelling  . Penicillins Nausea And Vomiting    Has patient had a PCN reaction causing immediate rash, facial/tongue/throat swelling, SOB or lightheadedness with hypotension: No Has patient had a PCN reaction causing severe rash involving mucus membranes or skin necrosis: No Has patient had a PCN reaction that required hospitalization No Has patient had a PCN reaction occurring within the last 10 years: No If all of the above answers are "NO", then may proceed with Cephalosporin use.     Family History  Problem Relation Age of Onset  . Cancer Mother   . Diabetes Mother   . Diabetes type I Daughter      Prior to Admission medications   Medication Sig Start Date End Date Taking? Authorizing Provider  acetaminophen (TYLENOL) 500 MG tablet Take 1,000 mg by mouth every 6 (six) hours as needed for headache.   Yes Historical Provider, MD  aspirin-acetaminophen-caffeine (EXCEDRIN MIGRAINE) [phone] MG tablet Take 2 tablets by mouth every 6 (six) hours as needed for headache.   Yes Historical Provider, MD  OVER THE COUNTER MEDICATION Take 1 capsule by mouth daily. Essential oil "DDR" for MS.  Yes Historical Provider, MD    Physical Exam: Filed Vitals:   03/16/16 1301 03/16/16 1406 03/16/16 1600 03/16/16 1732  BP: 123/52 130/53 122/62 147/76  Pulse: 75 73 73 92  Temp: 98.2 F (36.8 C) 98.2 F (36.8 C) 98.1 F (36.7 C)   TempSrc: Oral Oral Oral   Resp:  18 16   Height: 5' (1.524 m)     Weight: 103.42 kg (228 lb)     SpO2: 96% 95% 95% 97%      Constitutional: NAD, calm, comfortable Filed Vitals:    03/16/16 1301 03/16/16 1406 03/16/16 1600 03/16/16 1732  BP: 123/52 130/53 122/62 147/76  Pulse: 75 73 73 92  Temp: 98.2 F (36.8 C) 98.2 F (36.8 C) 98.1 F (36.7 C)   TempSrc: Oral Oral Oral   Resp:  18 16   Height: 5' (1.524 m)     Weight: 103.42 kg (228 lb)     SpO2: 96% 95% 95% 97%   Eyes: PERRL, lids and conjunctivae normal ENMT: Mucous membranes are moist. Posterior pharynx clear of any exudate or lesions.Normal dentition.  Neck: normal, supple, no masses, no thyromegaly Respiratory: clear to auscultation bilaterally, no wheezing, no crackles. Normal respiratory effort. No accessory muscle use.  Cardiovascular: Regular rate and rhythm, no murmurs / rubs / gallops. No extremity edema. 2+ pedal pulses. No carotid bruits.  Abdomen: no tenderness, no masses palpated. No hepatosplenomegaly. Bowel sounds positive.  Musculoskeletal: no clubbing / cyanosis. No joint deformity upper and lower extremities. Good ROM, no contractures. Normal muscle tone.  Skin: no rashes, lesions, ulcers. No induration Neurologic: CN 2-12 grossly intact. Strength is 3-4 out of 5 on the left side. 5 out of 5 on the right side. Psychiatric: Normal judgment and insight. Alert and oriented x 3. Normal mood.    Labs on Admission: I have personally reviewed following labs and imaging studies  CBC:  Recent Labs Lab 03/16/16 0820 03/16/16 0834  WBC 12.9*  --   NEUTROABS 7.8*  --   HGB 14.3 15.6*  HCT 42.5 46.0  MCV 87.8  --   PLT 337  --    Basic Metabolic Panel:  Recent Labs Lab 03/16/16 0800 03/16/16 0834  NA 142 141  K 3.3* 3.3*  CL 103 102  CO2 29  --   GLUCOSE 130* 122*  BUN 14 16  CREATININE 0.92 0.80  CALCIUM 9.0  --    GFR: Estimated Creatinine Clearance: 88.2 mL/min (by C-G formula based on Cr of 0.8). Liver Function Tests: No results for input(s): AST, ALT, ALKPHOS, BILITOT, PROT, ALBUMIN in the last 168 hours. No results for input(s): LIPASE, AMYLASE in the last 168 hours. No  results for input(s): AMMONIA in the last 168 hours. Coagulation Profile:  Recent Labs Lab 03/16/16 0820  INR 1.00   Cardiac Enzymes:  Recent Labs Lab 03/16/16 0820  TROPONINI <0.03   BNP (last 3 results) No results for input(s): PROBNP in the last 8760 hours. HbA1C: No results for input(s): HGBA1C in the last 72 hours. CBG: No results for input(s): GLUCAP in the last 168 hours. Lipid Profile: No results for input(s): CHOL, HDL, LDLCALC, TRIG, CHOLHDL, LDLDIRECT in the last 72 hours. Thyroid Function Tests: No results for input(s): TSH, T4TOTAL, FREET4, T3FREE, THYROIDAB in the last 72 hours. Anemia Panel: No results for input(s): VITAMINB12, FOLATE, FERRITIN, TIBC, IRON, RETICCTPCT in the last 72 hours. Urine analysis:    Component Value Date/Time   COLORURINE YELLOW 03/16/2016 0948  APPEARANCEUR CLEAR 03/16/2016 0948   LABSPEC <1.005* 03/16/2016 0948   PHURINE 6.0 03/16/2016 0948   GLUCOSEU NEGATIVE 03/16/2016 0948   HGBUR MODERATE* 03/16/2016 0948   BILIRUBINUR NEGATIVE 03/16/2016 0948   KETONESUR NEGATIVE 03/16/2016 0948   PROTEINUR NEGATIVE 03/16/2016 0948   UROBILINOGEN 0.2 04/29/2014 0625   NITRITE NEGATIVE 03/16/2016 0948   LEUKOCYTESUR NEGATIVE 03/16/2016 0948   Sepsis Labs: @LABRCNTIP (procalcitonin:4,lacticidven:4) )No results found for this or any previous visit (from the past 240 hour(s)).   Radiological Exams on Admission: Dg Chest 2 View  03/16/2016  CLINICAL DATA:  Weakness, chest pain EXAM: CHEST  2 VIEW COMPARISON:  03/31/2015 FINDINGS: Borderline cardiomegaly. Limited study by poor inspiration. No acute infiltrate or pleural effusion. No pulmonary edema. Mild degenerative changes thoracic spine. IMPRESSION: No active disease.  Mild degenerative changes thoracic spine. Electronically Signed   By: [name] M.D.   On: 03/16/2016 08:56   Ct Head Wo Contrast  03/16/2016  CLINICAL DATA:  Chest pain this morning, left-sided weakness EXAM: CT HEAD  WITHOUT CONTRAST TECHNIQUE: Contiguous axial images were obtained from the base of the skull through the vertex without intravenous contrast. COMPARISON:  04/27/2013 and brain MRI 04/28/2014 FINDINGS: Brain: No intracranial hemorrhage, mass effect or midline shift. No definite acute cortical infarction. No mass lesion is noted on this unenhanced scan. Stable subtle periventricular tiny foci of white matter decreased attenuation consistent with history of multiple sclerosis without significant change from prior exam. No mass lesion is noted on this unenhanced scan. No intraventricular hemorrhage. Vascular: No hyperdense vessel or unexpected calcification. Skull: Negative for fracture or focal lesion. Sinuses/Orbits: No acute findings. Other: None. IMPRESSION: No acute intracranial abnormality. Stable subtle tiny foci of white matter decreased attenuation probable due to chronic multiple sclerosis. No definite acute cortical infarction. These results were called by telephone at the time of interpretation on 03/16/2016 at 8:18 am to Dr. Nat Christen , who verbally acknowledged these results. Electronically Signed   By: [name] M.D.   On: 03/16/2016 08:18    EKG: Independently reviewed. No acute changes  Assessment/Plan Active Problems:   Chest pain   Hypokalemia   MS (multiple sclerosis) ([facility])   Numbness   Left-sided weakness    1. Left-sided weakness. Etiology is unclear, although I suspect this may be related to a flare of her multiple sclerosis. Will consult neurology to see if she is a candidate for steroids. Defer further imaging including MRI brain to neurology. 2. Chest pain, atypical. Suspect this is also related to MS . EKG is unremarkable. We'll cycle cardiac markers and monitor on telemetry for 24 hours. 3. Hypokalemia. Replace 4. Multiple sclerosis. She is not on any specific treatment. She has seen Dr. [name] in the past, but has been unable to follow up due to lack of  insurance.   DVT prophylaxis: lovenox Code Status: full code Family Communication: discussed with husband at the bedside Disposition Plan: discharge home once improved Consults called: neurology Admission status: observation   [place] MD [facility] Pager [phone]  If 7PM-7AM, please contact night-coverage [email or web address] Password [email or web address]  03/16/2016, 7:01 PM

## 2016-03-16 NOTE — ED Notes (Addendum)
Pt reports chest pain that woke pt up this am. Pt reports pain started approx 455. Pt reports left arm pain and jaw pain,sob,sweating. Pt reports pain transitioned to numbness of left arm and left jaw/face,left leg weakness. Pt also reports a history of MS. EDP notified.

## 2016-03-16 NOTE — ED Provider Notes (Addendum)
CSN: [id number]     Arrival date & time 03/16/16  [id number] History   First MD Initiated Contact with Patient 03/16/16 0802     Chief Complaint  Patient presents with  . Code Stroke    CRITICAL CARE Performed by: Nat Christen Total critical care time: 30 minutes Critical care time was exclusive of separately billable procedures and treating other patients. Critical care was necessary to treat or prevent imminent or life-threatening deterioration. Critical care was time spent personally by me on the following activities: development of treatment plan with patient and/or surrogate as well as nursing, discussions with consultants, evaluation of patient's response to treatment, examination of patient, obtaining history from patient or surrogate, ordering and performing treatments and interventions, ordering and review of laboratory studies, ordering and review of radiographic studies, pulse oximetry and re-evaluation of patient's condition. (Consider location/radiation/quality/duration/timing/severity/associated sxs/prior Treatment) HPI.Marland KitchenMarland KitchenMarland KitchenLevel V caveat for urgent need for intervention. Patient awoke at 4:55 AM this morning with a sense of chest pain with radiation to the left neck and lower lip, dyspnea, diaphoresis, nausea.   Additionally, her left arm and left leg feel numb and tingly. She has a history of multiple sclerosis, but no Rx at this time. She has been seen by a local neurologist in the past. No personal history of heart disease. No family history of early MI. Nonsmoker.  Past Medical History  Diagnosis Date  . Rotator cuff arthropathy, left   . IBS (irritable bowel syndrome)   . Pneumonia   . Asthma   . Palpitations   . Anxiety   . MS (multiple sclerosis) ([place])   . Chronic headaches    Past Surgical History  Procedure Laterality Date  . Abdominal hysterectomy    . Rotator cuff repair    . Cholecystectomy    . Appendectomy    . Cesarean section     Family History  Problem  Relation Age of Onset  . Cancer Mother   . Diabetes Mother   . Diabetes type I Daughter    Social History  Substance Use Topics  . Smoking status: Never Smoker   . Smokeless tobacco: Never Used  . Alcohol Use: No   OB History    Gravida Para Term Preterm AB TAB SAB Ectopic Multiple Living   5 2 2  3  3   2      Review of Systems  Reason unable to perform ROS: Urgent need for intervention.      Allergies  Morphine and related and Penicillins  Home Medications   Prior to Admission medications   Medication Sig Start Date End Date Taking? Authorizing Provider  traMADol (ULTRAM) 50 MG tablet Take 1 tablet (50 mg total) by mouth every 6 (six) hours as needed. 03/31/15   Milton Ferguson, MD   BP 157/81 mmHg  Pulse 88  Temp(Src) 98.2 F (36.8 C) (Oral)  Resp 22  Ht 5' (1.524 m)  Wt 228 lb (103.42 kg)  BMI 44.53 kg/m2  SpO2 100% Physical Exam  Constitutional: She is oriented to person, place, and time. She appears well-developed and well-nourished.  Alert and oriented 3  HENT:  Head: Normocephalic and atraumatic.  Eyes: Conjunctivae and EOM are normal. Pupils are equal, round, and reactive to light.  Neck: Normal range of motion. Neck supple.  Cardiovascular: Normal rate and regular rhythm.   Pulmonary/Chest: Effort normal and breath sounds normal.  Abdominal: Soft. Bowel sounds are normal.  Musculoskeletal: Normal range of motion.  Neurological: She is alert and oriented  to person, place, and time.  Questionable left arm and left leg weakness  Skin: Skin is warm and dry.  Psychiatric: She has a normal mood and affect. Her behavior is normal.  Nursing note and vitals reviewed.   ED Course  Procedures (including critical care time) Labs Review Labs Reviewed  ETHANOL  BASIC METABOLIC PANEL  CBC  TROPONIN I  PROTIME-INR  APTT  URINE RAPID DRUG SCREEN, HOSP PERFORMED  URINALYSIS, ROUTINE W REFLEX MICROSCOPIC (NOT AT [facility])  DIFFERENTIAL  D-DIMER, QUANTITATIVE  (NOT AT [facility])  I-STAT CHEM 8, ED    Imaging Review Ct Head Wo Contrast  03/16/2016  CLINICAL DATA:  Chest pain this morning, left-sided weakness EXAM: CT HEAD WITHOUT CONTRAST TECHNIQUE: Contiguous axial images were obtained from the base of the skull through the vertex without intravenous contrast. COMPARISON:  04/27/2013 and brain MRI 04/28/2014 FINDINGS: Brain: No intracranial hemorrhage, mass effect or midline shift. No definite acute cortical infarction. No mass lesion is noted on this unenhanced scan. Stable subtle periventricular tiny foci of white matter decreased attenuation consistent with history of multiple sclerosis without significant change from prior exam. No mass lesion is noted on this unenhanced scan. No intraventricular hemorrhage. Vascular: No hyperdense vessel or unexpected calcification. Skull: Negative for fracture or focal lesion. Sinuses/Orbits: No acute findings. Other: None. IMPRESSION: No acute intracranial abnormality. Stable subtle tiny foci of white matter decreased attenuation probable due to chronic multiple sclerosis. No definite acute cortical infarction. These results were called by telephone at the time of interpretation on 03/16/2016 at 8:18 am to Dr. Nat Christen , who verbally acknowledged these results. Electronically Signed   By: [name] M.D.   On: 03/16/2016 08:18   I have personally reviewed and evaluated these images and lab results as part of my medical decision-making.   EKG Interpretation None     CRITICAL CARE Performed by: Nat Christen Total critical care time: 35 minutes Critical care time was exclusive of separately billable procedures and treating other patients. Critical care was necessary to treat or prevent imminent or life-threatening deterioration. Critical care was time spent personally by me on the following activities: development of treatment plan with patient and/or surrogate as well as nursing, discussions with consultants, evaluation of  patient's response to treatment, examination of patient, obtaining history from patient or surrogate, ordering and performing treatments and interventions, ordering and review of laboratory studies, ordering and review of radiographic studies, pulse oximetry and re-evaluation of patient's condition. MDM   Final diagnoses:  MS (multiple sclerosis) ([place])  Numbness  Chest pain, unspecified chest pain type    Code stroke initiated. Will also get workup for chest pain. Patient will be admitted. She is hemodynamically stable at this time.  8:15 AM: Spoke with tele neurologist. He did not think this was an active stroke. Could be related to a MS flare. Recommend admit to observation.  Nat Christen, MD 03/16/16 0830  Nat Christen, MD 03/17/16 [phone]

## 2016-03-16 NOTE — ED Notes (Signed)
EDP at bedside  

## 2016-03-17 ENCOUNTER — Encounter (HOSPITAL_COMMUNITY): Payer: Self-pay | Admitting: Radiology

## 2016-03-17 ENCOUNTER — Observation Stay (HOSPITAL_COMMUNITY): Payer: Medicaid Other

## 2016-03-17 DIAGNOSIS — Z6841 Body Mass Index (BMI) 40.0 and over, adult: Secondary | ICD-10-CM | POA: Diagnosis not present

## 2016-03-17 DIAGNOSIS — G35 Multiple sclerosis: Secondary | ICD-10-CM | POA: Diagnosis present

## 2016-03-17 DIAGNOSIS — R531 Weakness: Secondary | ICD-10-CM | POA: Diagnosis present

## 2016-03-17 DIAGNOSIS — R0789 Other chest pain: Secondary | ICD-10-CM | POA: Diagnosis present

## 2016-03-17 DIAGNOSIS — M6289 Other specified disorders of muscle: Secondary | ICD-10-CM | POA: Diagnosis not present

## 2016-03-17 DIAGNOSIS — R079 Chest pain, unspecified: Secondary | ICD-10-CM | POA: Diagnosis not present

## 2016-03-17 DIAGNOSIS — E876 Hypokalemia: Secondary | ICD-10-CM | POA: Diagnosis present

## 2016-03-17 LAB — BASIC METABOLIC PANEL
ANION GAP: 7 (ref 5–15)
BUN: 11 mg/dL (ref 6–20)
CALCIUM: 8.7 mg/dL — AB (ref 8.9–10.3)
CO2: 27 mmol/L (ref 22–32)
CREATININE: 0.85 mg/dL (ref 0.44–1.00)
Chloride: 107 mmol/L (ref 101–111)
GFR calc non Af Amer: >60 mL/min (ref >60)
Glucose, Bld: 107 mg/dL — ABNORMAL HIGH (ref 65–99)
Potassium: 4.4 mmol/L (ref 3.5–5.1)
SODIUM: 141 mmol/L (ref 135–145)

## 2016-03-17 LAB — CBC
HCT: 39.7 % (ref 36.0–46.0)
Hemoglobin: 12.9 g/dL (ref 12.0–15.0)
MCH: 29.1 pg (ref 26.0–34.0)
MCHC: 32.5 g/dL (ref 30.0–36.0)
MCV: 89.6 fL (ref 78.0–100.0)
PLATELETS: 338 10*3/uL (ref 150–400)
RBC: 4.43 MIL/uL (ref 3.87–5.11)
RDW: 14 % (ref 11.5–15.5)
WBC: 11.1 10*3/uL — AB (ref 4.0–10.5)

## 2016-03-17 LAB — TROPONIN I

## 2016-03-17 LAB — VITAMIN B12: VITAMIN B 12: 412 pg/mL (ref 180–914)

## 2016-03-17 MED ORDER — SODIUM CHLORIDE 0.9 % IV SOLN
1000.0000 mg | Freq: Every day | INTRAVENOUS | Status: DC
  Administered 2016-03-17 – 2016-03-21 (×5): 1000 mg via INTRAVENOUS
  Filled 2016-03-17 (×7): qty 8

## 2016-03-17 MED ORDER — LORAZEPAM 2 MG/ML IJ SOLN
1.0000 mg | Freq: Once | INTRAMUSCULAR | Status: AC
  Administered 2016-03-17: 1 mg via INTRAVENOUS
  Filled 2016-03-17: qty 1

## 2016-03-17 MED ORDER — GADOBENATE DIMEGLUMINE 529 MG/ML IV SOLN
20.0000 mL | Freq: Once | INTRAVENOUS | Status: AC | PRN
  Administered 2016-03-17: 20 mL via INTRAVENOUS

## 2016-03-17 MED ORDER — IOHEXOL 350 MG/ML SOLN
100.0000 mL | Freq: Once | INTRAVENOUS | Status: AC | PRN
  Administered 2016-03-17: 100 mL via INTRAVENOUS

## 2016-03-17 NOTE — Progress Notes (Signed)
PROGRESS NOTE    Kristine Miller  [id number] DOB: [birth year] DOA: 03/16/2016 PCP: [name], MD Outpatient Specialists:    Brief Narrative:  17 yof with ahx of MS presented to the ED with multiple complaints. She initially began experiencing increasing fatigue and somnolence and subsequently developed lower extremity weakness with progressed to bilateral lower extremity weakness. She has also been experiencing a dull aching discomfort in her chest, numbness in her jaw. She was referred for further observation.    Assessment & Plan:   Active Problems:   Chest pain   Hypokalemia   MS (multiple sclerosis) ([facility])   Numbness   Left-sided weakness   1. Left-sided weakness. Etiology is unclear, although I suspect this may be related to a flare of her multiple sclerosis. CT head unremarkable. Neurology has seen the pt and has started her on high dose steroids. She is on Day 1 of 5 of steroids. They have also recommended MRI imaging of the brain and c-spine. 2. Chest pain, atypical. Suspect this is also related to MS. EKG is unremarkable. Troponin negative. She reports intermittent central chest pain over night. She has multiple family members who have recently had PE. D-dimer is elevated. Will check CT angio to rule out any PE. Continue to monitor on telemetry. 3. Hypokalemia. Replaced. 4. Multiple sclerosis. She is not on any specific treatment. She has seen Dr. [name] in the past, but has been unable to follow up due to lack of insurance. Started on high dose steroids.   DVT prophylaxis: Lovenox Code Status: Full Family Communication: discussed with patient and husband present at bedside. Disposition Plan: Discharge home once improved   Consultants:   Neurology  Procedures:   none  Antimicrobials:   none    Subjective: Feels okay today. Overnight she would experience central chest pain and was not breathing properly while sleeping. She was also unable to do much  therapy this morning due to dizziness.    Objective: Filed Vitals:   03/16/16 1600 03/16/16 1732 03/16/16 2106 03/17/16 0545  BP: 122/62 147/76 120/65 148/79  Pulse: 73 92 70   Temp: 98.1 F (36.7 C)  98.7 F (37.1 C) 97.5 F (36.4 C)  TempSrc: Oral  Oral Oral  Resp: 16  20   Height:      Weight:      SpO2: 95% 97% 97% 98%    Intake/Output Summary (Last 24 hours) at 03/17/16 0741 Last data filed at 03/16/16 1803  Gross per 24 hour  Intake    240 ml  Output      3 ml  Net    237 ml   Filed Weights   03/16/16 0750 03/16/16 1301  Weight: 103.42 kg (228 lb) 103.42 kg (228 lb)    Examination:  General exam: Appears calm and comfortable  Respiratory system: Clear to auscultation. Respiratory effort normal. Cardiovascular system: S1 & S2 heard, RRR. No JVD, murmurs, rubs, gallops or clicks. No pedal edema. Gastrointestinal system: Abdomen is nondistended, soft and nontender. No organomegaly or masses felt. Normal bowel sounds heard. Skin: No rashes, lesions or ulcers Psychiatry: Judgement and insight appear normal. Mood & affect appropriate.     Data Reviewed: I have personally reviewed following labs and imaging studies  CBC:  Recent Labs Lab 03/16/16 0820 03/16/16 0834 03/16/16 1949 03/17/16 0512  WBC 12.9*  --  11.6* 11.1*  NEUTROABS 7.8*  --   --   --   HGB 14.3 15.6* 13.4 12.9  HCT 42.5  46.0 41.4 39.7  MCV 87.8  --  88.5 89.6  PLT 337  --  368 [id number]   Basic Metabolic Panel:  Recent Labs Lab 03/16/16 0800 03/16/16 0834 03/16/16 1949 03/17/16 0512  NA 142 141  --  141  K 3.3* 3.3*  --  4.4  CL 103 102  --  107  CO2 29  --   --  27  GLUCOSE 130* 122*  --  107*  BUN 14 16  --  11  CREATININE 0.92 0.80 0.93 0.85  CALCIUM 9.0  --   --  8.7*  MG  --   --  1.9  --    GFR: Estimated Creatinine Clearance: 83 mL/min (by C-G formula based on Cr of 0.85). Liver Function Tests: No results for input(s): AST, ALT, ALKPHOS, BILITOT, PROT, ALBUMIN in the last  168 hours. No results for input(s): LIPASE, AMYLASE in the last 168 hours. No results for input(s): AMMONIA in the last 168 hours. Coagulation Profile:  Recent Labs Lab 03/16/16 0820  INR 1.00   Cardiac Enzymes:  Recent Labs Lab 03/16/16 0820 03/16/16 1949 03/17/16 0103 03/17/16 0512  TROPONINI <0.03 <0.03 <0.03 <0.03   BNP (last 3 results) No results for input(s): PROBNP in the last 8760 hours. HbA1C: No results for input(s): HGBA1C in the last 72 hours. CBG: No results for input(s): GLUCAP in the last 168 hours. Lipid Profile: No results for input(s): CHOL, HDL, LDLCALC, TRIG, CHOLHDL, LDLDIRECT in the last 72 hours. Thyroid Function Tests:  Recent Labs  03/16/16 0820  TSH 5.175*   Anemia Panel: No results for input(s): VITAMINB12, FOLATE, FERRITIN, TIBC, IRON, RETICCTPCT in the last 72 hours. Urine analysis:    Component Value Date/Time   COLORURINE YELLOW 03/16/2016 0948   APPEARANCEUR CLEAR 03/16/2016 0948   LABSPEC <1.005* 03/16/2016 0948   PHURINE 6.0 03/16/2016 0948   GLUCOSEU NEGATIVE 03/16/2016 0948   HGBUR MODERATE* 03/16/2016 0948   BILIRUBINUR NEGATIVE 03/16/2016 0948   KETONESUR NEGATIVE 03/16/2016 0948   PROTEINUR NEGATIVE 03/16/2016 0948   UROBILINOGEN 0.2 04/29/2014 0625   NITRITE NEGATIVE 03/16/2016 0948   LEUKOCYTESUR NEGATIVE 03/16/2016 0948   Sepsis Labs: @LABRCNTIP (procalcitonin:4,lacticidven:4)  )No results found for this or any previous visit (from the past 240 hour(s)).     Radiology Studies: Dg Chest 2 View  03/16/2016  CLINICAL DATA:  Weakness, chest pain EXAM: CHEST  2 VIEW COMPARISON:  03/31/2015 FINDINGS: Borderline cardiomegaly. Limited study by poor inspiration. No acute infiltrate or pleural effusion. No pulmonary edema. Mild degenerative changes thoracic spine. IMPRESSION: No active disease.  Mild degenerative changes thoracic spine. Electronically Signed   By: [name] M.D.   On: 03/16/2016 08:56   Ct Head Wo  Contrast  03/16/2016  CLINICAL DATA:  Chest pain this morning, left-sided weakness EXAM: CT HEAD WITHOUT CONTRAST TECHNIQUE: Contiguous axial images were obtained from the base of the skull through the vertex without intravenous contrast. COMPARISON:  04/27/2013 and brain MRI 04/28/2014 FINDINGS: Brain: No intracranial hemorrhage, mass effect or midline shift. No definite acute cortical infarction. No mass lesion is noted on this unenhanced scan. Stable subtle periventricular tiny foci of white matter decreased attenuation consistent with history of multiple sclerosis without significant change from prior exam. No mass lesion is noted on this unenhanced scan. No intraventricular hemorrhage. Vascular: No hyperdense vessel or unexpected calcification. Skull: Negative for fracture or focal lesion. Sinuses/Orbits: No acute findings. Other: None. IMPRESSION: No acute intracranial abnormality. Stable subtle tiny foci of white  matter decreased attenuation probable due to chronic multiple sclerosis. No definite acute cortical infarction. These results were called by telephone at the time of interpretation on 03/16/2016 at 8:18 am to Dr. Nat Christen , who verbally acknowledged these results. Electronically Signed   By: [name] M.D.   On: 03/16/2016 08:18        Scheduled Meds: . enoxaparin (LOVENOX) injection  40 mg Subcutaneous Q24H  . sodium chloride flush  3 mL Intravenous Q12H   Continuous Infusions: . sodium chloride 75 mL/hr at 03/16/16 2134       Time spent: 25 minutes   Kathie Dike, MD [facility] Pager [phone]  If 7PM-7AM, please contact night-coverage [email or web address] Password [email or web address] 03/17/2016, 7:41 AM   By signing my name below, I, Delene Ruffini, attest that this documentation has been prepared under the direction and in the presence of Kathie Dike, MD. Electronically Signed: Delene Ruffini 03/17/2016 1:35pm  I, Dr. Kathie Dike, personally performed the  services described in this documentaiton. All medical record entries made by the scribe were at my direction and in my presence. I have reviewed the chart and agree that the record reflects my personal performance and is accurate and complete  Kathie Dike, MD, 03/17/2016 2:18 PM

## 2016-03-17 NOTE — Progress Notes (Signed)
Patient called staff to room with c/o chest pain and discomfort.  Patient states pain is worse when patient sits up in 90 degree angle and is c/o being short of breath.  EKG and was wnl and vitals wnl.  Notified MD of patient condition.  MD came to floor and assessed patient.  Will continue to monitor patient.

## 2016-03-17 NOTE — Clinical Social Work Note (Signed)
CSW received referral for assistance with insurance. Financial counselor notified. CSW will sign off, but can be reconsulted if needed.  Kristine Miller, [place]

## 2016-03-17 NOTE — Evaluation (Signed)
Physical Therapy Evaluation Patient Details Name: BARBARAJEAN GOULART MRN: [id number] DOB: 06-06-1961 Today's Date: 03/17/2016   History of Present Illness  RAYONA LOVEDAY is a 55 y.o. female with medical history significant of multiple sclerosis, presents to the ER with a variety of complaints. According to the patient, her symptoms began approximately 2 weeks ago. This started with increasing fatigue and somnolence. She subsequently developed left lower extremity weakness, when she noticed that her left foot was dragging when she was ambulating. After a few days, this progressed to bilateral lower extremity weakness. The patient did not seek medical attention, and her symptoms continued to progress. She reports that this morning, she woke up from sleep with complaints of a dull aching discomfort in her chest. She has associated heaviness on her chest with shortness of breath. She began feeling numbness in her left jaw area which radiated down her left arm to her elbow. Distal to her left elbow, she reported tingling and weakness. She presented to the ER for evaluation. She's not had any fever, cough, recent respiratory tract infection. She's not had any vomiting or diarrhea. When she was having her chest discomfort, she does describe shortness of breath, diaphoresis and lightheadedness.  Head CT: Stable subtle tiny foci of white matter decreased attenuation probable due to chronic multiple sclerosis. No definite acute cortical infarction.  Clinical Impression  Pt received in bed, husband present, and pt is agreeable to PT evaluation.  Pt normally lives with her husband and 1 yo son, and is independent with ADL's and IADL's, however she has needed to use a cane since Sunday for ambulation.  During today's PT evaluation she demonstrates significant L sided weakness in her L UE and L LE, and c/o tingling in her L side as well.  She required Min A for gait x 84ft with RW.  Further gait limited due to pt c/o  significant dizziness with the room spinning.  Pt demonstrates very stiff and robotic gait pattern, with difficulty clearing both the L and the R foot, however no strength deficits noted in R ankle during MMT.  Pt would benefit from OPPT upon d/c, however she does have concern about payment due to having no insurance. She will also need to use a RW until her strength and endurance improves - she already has a RW at home.     Follow Up Recommendations Outpatient PT    Equipment Recommendations  None recommended by PT    Recommendations for Other Services       Precautions / Restrictions Restrictions Weight Bearing Restrictions: No      Mobility  Bed Mobility Overal bed mobility: Modified Independent             General bed mobility comments: HOB raised, but pt was able to advance herself to the EOB utilizing bed rail.  Upon sitting up on the EOB, pt expressed that she is dizzy.  BP taken, and was 135/70.  Transfers Overall transfer level: Needs assistance   Transfers: Sit to/from Stand Sit to Stand: Min guard            Ambulation/Gait Ambulation/Gait assistance: Min assist Ambulation Distance (Feet): 20 Feet Assistive device: Rolling walker (2 wheeled) Gait Pattern/deviations: Decreased dorsiflexion - left;Decreased step length - right;Decreased step length - left;Step-to pattern     General Gait Details: Pt demonstrates very rigid gait pattern with decreased knee flexion for limb advancement on either LE's.  Pt also demonstrates difficulty with B toe clearance with L  ankle demonstrating only 2/5 strength for dorsiflexion.  Gait limited due to c/o dizziness.   Stairs            Wheelchair Mobility    Modified Rankin (Stroke Patients Only)       Balance Overall balance assessment: Needs assistance         Standing balance support: Bilateral upper extremity supported Standing balance-Leahy Scale: Fair                                Pertinent Vitals/Pain Pain Assessment: No/denies pain (Pt states she was just given something for pain and is feeling "out of it." )    Home Living Family/patient expects to be discharged to:: Private residence Living Arrangements: Spouse/significant other Available Help at Discharge: Family (husband and 75 yo son) Type of Home: Mobile home Home Access: Stairs to enter Entrance Stairs-Rails: Can reach both Entrance Stairs-Number of Steps: [place]: One level Home Equipment: [place] - 2 wheels;Cane - single point;Wheelchair - manual Additional Comments: Pt states that she has availability to borrow a BSC.     Prior Function Level of Independence: Independent with assistive device(s)   Gait / Transfers Assistance Needed: Pt stats she was using a cane since Sunday prior to admission (~4days)  ADL's / Homemaking Assistance Needed: Pt was independent with ADL's.         Hand Dominance   Dominant Hand: Right    Extremity/Trunk Assessment   Upper Extremity Assessment: LUE deficits/detail;Generalized weakness       LUE Deficits / Details: Pt with shoulder flexion 3/5, and elbow flexion 3/5   Lower Extremity Assessment: Generalized weakness;LLE deficits/detail   LLE Deficits / Details: L knee extension 2/5, and hip flexion at 3/5, DF: 2/5     Communication   Communication: No difficulties  Cognition Arousal/Alertness: Awake/alert Behavior During Therapy: WFL for tasks assessed/performed Overall Cognitive Status: Within Functional Limits for tasks assessed                      General Comments      Exercises        Assessment/Plan    PT Assessment Patient needs continued PT services  PT Diagnosis Difficulty walking;Abnormality of gait;Generalized weakness   PT Problem List Decreased strength;Decreased range of motion;Decreased activity tolerance;Decreased balance;Decreased mobility;Cardiopulmonary status limiting activity;Obesity  PT Treatment  Interventions Gait training;Stair training;DME instruction;Functional mobility training;Therapeutic activities;Therapeutic exercise;Balance training;Neuromuscular re-education;Patient/family education   PT Goals (Current goals can be found in the Care Plan section) Acute Rehab PT Goals Patient Stated Goal: Pt wants to be on the go again.   PT Goal Formulation: With patient/family Time For Goal Achievement: 03/24/16 Potential to Achieve Goals: Good    Frequency Min 5X/week   Barriers to discharge Decreased caregiver support      Co-evaluation               End of Session Equipment Utilized During Treatment: Gait belt Activity Tolerance: Other (comment) (Pt limited by dizziness)             Time: [id number] PT Time Calculation (min) (ACUTE ONLY): 28 min   Charges:   PT Evaluation $PT Eval Moderate Complexity: 1 Procedure PT Treatments $Gait Training: 8-22 mins   PT G Codes:        Beth Andruw Battie, PT, DPT X: [id number]   03/17/2016, 10:36 AM

## 2016-03-17 NOTE — Consult Note (Signed)
[place] A. [name], MD     [email or web address]          Kristine Miller is an 55 y.o. female.   ASSESSMENT/PLAN: Severe relapse of multiple sclerosis in a patient who is not on immunomodulating therapy/diseased modifying therapy.  Morbid obesity.  RECOMMENDATION: High-dose on Medrol 1 g IV daily for 5 days. Physical and occupational therapy. Social work consult to help with the insurance and  medications. Repeat MRI of the cervical spine and the brain with and without contrast.  The patient is a 55 year old white female who has a known history of multiple sclerosis. She has been admitted here for the third time with a similar complaints. The patient's previous workup is listed below. The patient cannot afford the disease modify medication from otosclerosis because she isn't insured and these medications are extremely expensive. She reports that she has had a good urine 2016 however. She has had some mild symptoms but no severe relapses until this year. Her last relapse was in 2015. She reports with severe left-sided pain included severe left-sided chest pain, left-sided weakness and numbness. She has had some other chronic symptoms such as fatigue, gait impairment and the pain which are common symptoms for multiple sclerosis. Otherwise, the review of systems is negative.   GENERAL: Pleasant and appears to be in some discomfort but in no acute distress.  HEENT: Supple. Atraumatic normocephalic.   ABDOMEN: soft  EXTREMITIES: No edema   BACK: Normal.  SKIN: Normal by inspection.    MENTAL STATUS: Alert and oriented. Speech, language and cognition are generally intact. Judgment and insight normal.   CRANIAL NERVES: Pupils are equal, round and reactive to light and accommodation; extra ocular movements are full, there is no significant nystagmus; visual fields are full; upper and lower facial muscles are normal in strength and symmetric, there is no flattening of  the nasolabial folds; tongue is midline; uvula is midline; shoulder elevation is normal.  MOTOR: She has normal tone bulk and strength of the right upper extremity and the right lower extremity. She has left hemiparesis with the left upper extremity graded as 4 minus/5. Left hip flexion 3 and dorsiflexion 2/5.  COORDINATION: Left finger to nose is normal, right finger to nose is normal, No rest tremor; no intention tremor; no postural tremor; no bradykinesia.  REFLEXES: Deep tendon reflexes are symmetrical and normal but brisk in the legs. Babinski reflexes are extensor on the left and equivocal on the right.   SENSATION: This is reduced to light touch and temperature on the left side.         [[[[[[[[[[[[[[[[[NEURO NOTE 2014 This patient has unexplained symptoms which are multiple. There is no clear clinical syndrome. Potential concerns include the following: Demyelinating processes such as multiple sclerosis, pseudotumor cerebri/idiopathic intracranial hypertension, infectious and inflammatory processes and psychosomatic causes. A brain MRI will be obtained. An EEG will also be done.   This is a 55 year old right-handed white female who presented to the hospital several days ago with numbness and tingling of the left upper extremity associated with chest pain and discomfort. The patient had an extensive evaluation at that time the workup was unrevealing. She was discharged with presented with similar symptoms again and apparently had another workup which was unrevealing. She presented overnight with again similar symptoms of chest pain. This time she had numbness of the upper and lower extremities associated with weakness. She particularly has weakness of the legs with difficulties ambulating. Additionally, she had flexor spasms  and the shaking of the upper and lower extremities with the more recent spells. The patient does not report any unusual psychosocial stressors. She is present today  with her husband. The husband tells me that she has had episodic headaches for the last 3-4 weeks. The headaches are located in the bifrontal region and the occipital region. She told me that she is typically not a headache person. The patient tends her headaches last several hours. It appears that the patient also has significant nausea with the headaches but no vomiting. The headaches are also associated with photophobia and phonophobia. The patient has had a workup again for her symptoms including more recently a head CT scan and CT edge exam of the chest. Both are essentially unremarkable. There is no dysarthria, dysphasia, dizziness or visual obscurations. The review of systems is otherwise negative other than stated above.  NEURO NOTE 2015  1. Acute relapse/exacerbation of multiple sclerosis. We will go ahead and complete a 5 day course of IV Solu-Medrol high dose. She has gone 2 doses already and will be given 3 additional doses. Physical therapy will be consulted for gait training. We need to address issue immunomodulating therapy but she is currently on insuring. We will still see if funding can be arranged.   2. Hyperglycemia suspected to be due to high dose Solu-Medrol. Continue with sliding scale.  3. Obesity.  This is a 55 year old white female who was diagnosed with multiple sclerosis last year. She was given high-dose Solu-Medrol but has been lost to follow-up mostly because of not being insured. She has never been started on immunomodulating therapies. The patient has had some chronic recurrent symptoms on a daily basis including fatigue, vertigo and patchy paresthesias that are fleeting. The patient had been out in the sun working, and her grafts which developed significant pain, numbness and paresthesias involving the Left thoracic region, Left upper extremity, Left facial region extending into the left lower extremity. The patient presented to the hospital and the was given a single dose  of high-dose Solu-Medrol. She was to come to the office to receive 2 additional doses. Unfortunately, she progressed and developed severe left leg weakness to the point that she was unable to ambulate. She subsequently told to come to the emergency room for further evaluation. She was given a second dose of Solu-Medrol last night. She still is having a lot of for symptoms on the left side. Review of systems is otherwise negative.]]]]]]]]]]]]]]]   Blood pressure 148/79, pulse 70, temperature 97.5 F (36.4 C), temperature source Oral, resp. rate 20, height 5' (1.524 m), weight 228 lb (103.42 kg), SpO2 98 %.  Past Medical History  Diagnosis Date  . Rotator cuff arthropathy, left   . IBS (irritable bowel syndrome)   . Pneumonia   . Asthma   . Palpitations   . Anxiety   . MS (multiple sclerosis) ([place])   . Chronic headaches     Past Surgical History  Procedure Laterality Date  . Abdominal hysterectomy    . Rotator cuff repair    . Cholecystectomy    . Appendectomy    . Cesarean section      Family History  Problem Relation Age of Onset  . Cancer Mother   . Diabetes Mother   . Diabetes type I Daughter     Social History:  reports that she has never smoked. She has never used smokeless tobacco. She reports that she does not drink alcohol or use illicit drugs.  Allergies:  Allergies  Allergen Reactions  . Morphine And Related Swelling  . Penicillins Nausea And Vomiting    Has patient had a PCN reaction causing immediate rash, facial/tongue/throat swelling, SOB or lightheadedness with hypotension: No Has patient had a PCN reaction causing severe rash involving mucus membranes or skin necrosis: No Has patient had a PCN reaction that required hospitalization No Has patient had a PCN reaction occurring within the last 10 years: No If all of the above answers are "NO", then may proceed with Cephalosporin use.     Medications: Prior to Admission medications   Medication Sig Start  Date End Date Taking? Authorizing Provider  acetaminophen (TYLENOL) 500 MG tablet Take 1,000 mg by mouth every 6 (six) hours as needed for headache.   Yes Historical Provider, MD  aspirin-acetaminophen-caffeine (EXCEDRIN MIGRAINE) 250-250-65 MG tablet Take 2 tablets by mouth every 6 (six) hours as needed for headache.   Yes Historical Provider, MD  OVER THE COUNTER MEDICATION Take 1 capsule by mouth daily. Essential oil "DDR" for MS.   Yes Historical Provider, MD    Scheduled Meds: . enoxaparin (LOVENOX) injection  40 mg Subcutaneous Q24H  . sodium chloride flush  3 mL Intravenous Q12H   Continuous Infusions: . sodium chloride 75 mL/hr at 03/16/16 2134   PRN Meds:.acetaminophen **OR** acetaminophen, HYDROcodone-acetaminophen, ondansetron **OR** ondansetron (ZOFRAN) IV     Results for orders placed or performed during the hospital encounter of 03/16/16 (from the past 48 hour(s))  Basic metabolic panel     Status: Abnormal   Collection Time: 03/16/16  8:00 AM  Result Value Ref Range   Sodium 142 135 - 145 mmol/L   Potassium 3.3 (L) 3.5 - 5.1 mmol/L   Chloride 103 101 - 111 mmol/L   CO2 29 22 - 32 mmol/L   Glucose, Bld 130 (H) 65 - 99 mg/dL   BUN 14 6 - 20 mg/dL   Creatinine, Ser 0.92 0.44 - 1.00 mg/dL   Calcium 9.0 8.9 - 10.3 mg/dL   GFR calc non Af Amer >60 >60 mL/min   GFR calc Af Amer >60 >60 mL/min    Comment: (NOTE) The eGFR has been calculated using the CKD EPI equation. This calculation has not been validated in all clinical situations. eGFR's persistently <60 mL/min signify possible Chronic Kidney Disease.    Anion gap 10 5 - 15  Ethanol     Status: None   Collection Time: 03/16/16  8:00 AM  Result Value Ref Range   Alcohol, Ethyl (B) <5 <5 mg/dL    Comment:        LOWEST DETECTABLE LIMIT FOR SERUM ALCOHOL IS 5 mg/dL FOR MEDICAL PURPOSES ONLY   CBC     Status: Abnormal   Collection Time: 03/16/16  8:20 AM  Result Value Ref Range   WBC 12.9 (H) 4.0 - 10.5 K/uL     RBC 4.84 3.87 - 5.11 MIL/uL   Hemoglobin 14.3 12.0 - 15.0 g/dL   HCT 42.5 36.0 - 46.0 %   MCV 87.8 78.0 - 100.0 fL   MCH 29.5 26.0 - 34.0 pg   MCHC 33.6 30.0 - 36.0 g/dL   RDW 13.5 11.5 - 15.5 %   Platelets 337 150 - 400 K/uL  Troponin I     Status: None   Collection Time: 03/16/16  8:20 AM  Result Value Ref Range   Troponin I <0.03 <0.031 ng/mL    Comment:        NO INDICATION OF MYOCARDIAL INJURY.     Protime-INR     Status: None   Collection Time: 03/16/16  8:20 AM  Result Value Ref Range   Prothrombin Time 13.4 11.6 - 15.2 seconds   INR 1.00 0.00 - 1.49  APTT     Status: None   Collection Time: 03/16/16  8:20 AM  Result Value Ref Range   aPTT 27 24 - 37 seconds  Differential     Status: Abnormal   Collection Time: 03/16/16  8:20 AM  Result Value Ref Range   Neutrophils Relative % 61 %   Neutro Abs 7.8 (H) 1.7 - 7.7 K/uL   Lymphocytes Relative 30 %   Lymphs Abs 3.9 0.7 - 4.0 K/uL   Monocytes Relative 8 %   Monocytes Absolute 1.0 0.1 - 1.0 K/uL   Eosinophils Relative 1 %   Eosinophils Absolute 0.1 0.0 - 0.7 K/uL   Basophils Relative 0 %   Basophils Absolute 0.0 0.0 - 0.1 K/uL  D-dimer, quantitative (not at [facility])     Status: Abnormal   Collection Time: 03/16/16  8:20 AM  Result Value Ref Range   D-Dimer, Quant 0.54 (H) 0.00 - 0.50 ug/mL-FEU    Comment: (NOTE) At the manufacturer cut-off of 0.50 ug/mL FEU, this assay has been documented to exclude PE with a sensitivity and negative predictive value of 97 to 99%.  At this time, this assay has not been approved by the FDA to exclude DVT/VTE. Results should be correlated with clinical presentation.   TSH     Status: Abnormal   Collection Time: 03/16/16  8:20 AM  Result Value Ref Range   TSH 5.175 (H) 0.350 - 4.500 uIU/mL  I-Stat Chem 8, ED  (not at [facility], [facility])     Status: Abnormal   Collection Time: 03/16/16  8:34 AM  Result Value Ref Range   Sodium 141 135 - 145 mmol/L   Potassium 3.3 (L) 3.5 - 5.1 mmol/L    Chloride 102 101 - 111 mmol/L   BUN 16 6 - 20 mg/dL   Creatinine, Ser 0.80 0.44 - 1.00 mg/dL   Glucose, Bld 122 (H) 65 - 99 mg/dL   Calcium, Ion 1.12 1.12 - 1.23 mmol/L   TCO2 28 0 - 100 mmol/L   Hemoglobin 15.6 (H) 12.0 - 15.0 g/dL   HCT 46.0 36.0 - 46.0 %  Urine rapid drug screen (hosp performed)not at [facility]     Status: None   Collection Time: 03/16/16  9:48 AM  Result Value Ref Range   Opiates NONE DETECTED NONE DETECTED   Cocaine NONE DETECTED NONE DETECTED   Benzodiazepines NONE DETECTED NONE DETECTED   Amphetamines NONE DETECTED NONE DETECTED   Tetrahydrocannabinol NONE DETECTED NONE DETECTED   Barbiturates NONE DETECTED NONE DETECTED    Comment:        DRUG SCREEN FOR MEDICAL PURPOSES ONLY.  IF CONFIRMATION IS NEEDED FOR ANY PURPOSE, NOTIFY LAB WITHIN 5 DAYS.        LOWEST DETECTABLE LIMITS FOR URINE DRUG SCREEN Drug Class       Cutoff (ng/mL) Amphetamine      1000 Barbiturate      200 Benzodiazepine   200 Tricyclics       300 Opiates          300 Cocaine          300 THC              50   Urinalysis, Routine w reflex microscopic (not at [facility])       Status: Abnormal   Collection Time: 03/16/16  9:48 AM  Result Value Ref Range   Color, Urine YELLOW YELLOW   APPearance CLEAR CLEAR   Specific Gravity, Urine <1.005 (L) 1.005 - 1.030   pH 6.0 5.0 - 8.0   Glucose, UA NEGATIVE NEGATIVE mg/dL   Hgb urine dipstick MODERATE (A) NEGATIVE   Bilirubin Urine NEGATIVE NEGATIVE   Ketones, ur NEGATIVE NEGATIVE mg/dL   Protein, ur NEGATIVE NEGATIVE mg/dL   Nitrite NEGATIVE NEGATIVE   Leukocytes, UA NEGATIVE NEGATIVE  Urine microscopic-add on     Status: Abnormal   Collection Time: 03/16/16  9:48 AM  Result Value Ref Range   Squamous Epithelial / LPF TOO NUMEROUS TO COUNT (A) NONE SEEN   WBC, UA NONE SEEN 0 - 5 WBC/hpf   RBC / HPF 6-30 0 - 5 RBC/hpf   Bacteria, UA NONE SEEN NONE SEEN  CBC     Status: Abnormal   Collection Time: 03/16/16  7:49 PM  Result Value Ref Range    WBC 11.6 (H) 4.0 - 10.5 K/uL   RBC 4.68 3.87 - 5.11 MIL/uL   Hemoglobin 13.4 12.0 - 15.0 g/dL   HCT 41.4 36.0 - 46.0 %   MCV 88.5 78.0 - 100.0 fL   MCH 28.6 26.0 - 34.0 pg   MCHC 32.4 30.0 - 36.0 g/dL   RDW 13.8 11.5 - 15.5 %   Platelets 368 150 - 400 K/uL  Creatinine, serum     Status: None   Collection Time: 03/16/16  7:49 PM  Result Value Ref Range   Creatinine, Ser 0.93 0.44 - 1.00 mg/dL   GFR calc non Af Amer >60 >60 mL/min   GFR calc Af Amer >60 >60 mL/min    Comment: (NOTE) The eGFR has been calculated using the CKD EPI equation. This calculation has not been validated in all clinical situations. eGFR's persistently <60 mL/min signify possible Chronic Kidney Disease.   Troponin I     Status: None   Collection Time: 03/16/16  7:49 PM  Result Value Ref Range   Troponin I <0.03 <0.031 ng/mL    Comment:        NO INDICATION OF MYOCARDIAL INJURY.   Magnesium     Status: None   Collection Time: 03/16/16  7:49 PM  Result Value Ref Range   Magnesium 1.9 1.7 - 2.4 mg/dL  Troponin I     Status: None   Collection Time: 03/17/16  1:03 AM  Result Value Ref Range   Troponin I <0.03 <0.031 ng/mL    Comment:        NO INDICATION OF MYOCARDIAL INJURY.   Basic metabolic panel     Status: Abnormal   Collection Time: 03/17/16  5:12 AM  Result Value Ref Range   Sodium 141 135 - 145 mmol/L   Potassium 4.4 3.5 - 5.1 mmol/L    Comment: DELTA CHECK NOTED   Chloride 107 101 - 111 mmol/L   CO2 27 22 - 32 mmol/L   Glucose, Bld 107 (H) 65 - 99 mg/dL   BUN 11 6 - 20 mg/dL   Creatinine, Ser 0.85 0.44 - 1.00 mg/dL   Calcium 8.7 (L) 8.9 - 10.3 mg/dL   GFR calc non Af Amer >60 >60 mL/min   GFR calc Af Amer >60 >60 mL/min    Comment: (NOTE) The eGFR has been calculated using the CKD EPI equation. This calculation has not been validated in all clinical situations. eGFR's persistently <60 mL/min  signify possible Chronic Kidney Disease.    Anion gap 7 5 - 15  CBC     Status: Abnormal     Collection Time: 03/17/16  5:12 AM  Result Value Ref Range   WBC 11.1 (H) 4.0 - 10.5 K/uL   RBC 4.43 3.87 - 5.11 MIL/uL   Hemoglobin 12.9 12.0 - 15.0 g/dL   HCT 39.7 36.0 - 46.0 %   MCV 89.6 78.0 - 100.0 fL   MCH 29.1 26.0 - 34.0 pg   MCHC 32.5 30.0 - 36.0 g/dL   RDW 14.0 11.5 - 15.5 %   Platelets 338 150 - 400 K/uL  Troponin I     Status: None   Collection Time: 03/17/16  5:12 AM  Result Value Ref Range   Troponin I <0.03 <0.031 ng/mL    Comment:        NO INDICATION OF MYOCARDIAL INJURY.     Studies/Results:     Marlei Glomski A. [name], M.D.  Diplomate, [employer] of Psychiatry and Neurology ( Neurology). 03/17/2016, 8:28 AM

## 2016-03-17 NOTE — Progress Notes (Signed)
Code Stroke 0800 call  Romeo beeper [phone] exam started 0807 exam finished 0807 images sent to [place] exam completed in [place]

## 2016-03-18 MED ORDER — TIZANIDINE HCL 4 MG PO TABS
4.0000 mg | ORAL_TABLET | Freq: Four times a day (QID) | ORAL | Status: DC | PRN
  Administered 2016-03-18: 4 mg via ORAL
  Filled 2016-03-18: qty 1

## 2016-03-18 NOTE — Progress Notes (Signed)
PROGRESS NOTE    Kristine Miller  [id number] DOB: 22-Oct-1961 DOA: 03/16/2016 PCP: [name], MD Outpatient Specialists:    Brief Narrative:  86 yof with ahx of MS presented to the ED with multiple complaints. She initially began experiencing increasing fatigue and somnolence and subsequently developed lower extremity weakness with progressed to bilateral lower extremity weakness. She has also been experiencing a dull aching discomfort in her chest, numbness in her jaw. She was referred for further observation.    Assessment & Plan:   Active Problems:   Chest pain   Hypokalemia   MS (multiple sclerosis) (HCC)   Numbness   Left-sided weakness   1. Left-sided weakness. Related to a flare of her multiple sclerosis. CT head unremarkable. Neurology has seen the pt and has started her on high dose steroids. She is on Day 2 of 5 of steroids. MRI imaging of the brain and c-spine were unremarkable. Will continue current tx. 2. Chest pain, atypical. Suspect this is also related to MS. EKG is unremarkable. Troponin negative. She has multiple family members who have recently had PE. D-dimer is elevated. CT angio negative for PE, but did show small hiatal hernia. Continue to monitor on telemetry. 3. Hypokalemia. Replaced. Improved. 4. Multiple sclerosis. She is not on any specific treatment. She has seen Dr. [name] in the past, but has been unable to follow up due to lack of insurance. Continue high dose steroids.   DVT prophylaxis: Lovenox Code Status: Full Family Communication: discussed with patient, husband and family present at bedside. Disposition Plan: Discharge home once improved   Consultants:   Neurology  Procedures:   none  Antimicrobials:   none    Subjective: Feels improved today. She still has a headache and the back of her neck is still sore. She has been ambulating but gets dizzy. She reports abdominal jerking over night but does not have any other muscle  spasms.  Objective: Filed Vitals:   03/17/16 1449 03/17/16 1452 03/17/16 2300 03/18/16 0511  BP: 138/68  139/71 125/68  Pulse: 93  100 85  Temp: 98.6 F (37 C)  98.4 F (36.9 C) 98.3 F (36.8 C)  TempSrc: Oral  Oral Oral  Resp: 18  20 18   Height:      Weight:      SpO2: 94% 91% 93% 92%    Intake/Output Summary (Last 24 hours) at 03/18/16 0725 Last data filed at 03/17/16 1831  Gross per 24 hour  Intake    723 ml  Output      2 ml  Net    721 ml   Filed Weights   03/16/16 0750 03/16/16 1301  Weight: 103.42 kg (228 lb) 103.42 kg (228 lb)    Examination:  General exam: Appears calm and comfortable  Respiratory system: Clear to auscultation. Respiratory effort normal. Cardiovascular system: S1 & S2 heard, RRR. No JVD, murmurs, rubs, gallops or clicks. No pedal edema. Gastrointestinal system: Abdomen is nondistended, soft and nontender. No organomegaly or masses felt. Normal bowel sounds heard. Skin: No rashes, lesions or ulcers Psychiatry: Judgement and insight appear normal. Mood & affect appropriate.    Data Reviewed: I have personally reviewed following labs and imaging studies  CBC:  Recent Labs Lab 03/16/16 0820 03/16/16 0834 03/16/16 1949 03/17/16 0512  WBC 12.9*  --  11.6* 11.1*  NEUTROABS 7.8*  --   --   --   HGB 14.3 15.6* 13.4 12.9  HCT 42.5 46.0 41.4 39.7  MCV 87.8  --  88.5 89.6  PLT 337  --  368 [id number]   Basic Metabolic Panel:  Recent Labs Lab 03/16/16 0800 03/16/16 0834 03/16/16 1949 03/17/16 0512  NA 142 141  --  141  K 3.3* 3.3*  --  4.4  CL 103 102  --  107  CO2 29  --   --  27  GLUCOSE 130* 122*  --  107*  BUN 14 16  --  11  CREATININE 0.92 0.80 0.93 0.85  CALCIUM 9.0  --   --  8.7*  MG  --   --  1.9  --    GFR: Estimated Creatinine Clearance: 83 mL/min (by C-G formula based on Cr of 0.85). Liver Function Tests: No results for input(s): AST, ALT, ALKPHOS, BILITOT, PROT, ALBUMIN in the last 168 hours. No results for input(s):  LIPASE, AMYLASE in the last 168 hours. No results for input(s): AMMONIA in the last 168 hours. Coagulation Profile:  Recent Labs Lab 03/16/16 0820  INR 1.00   Cardiac Enzymes:  Recent Labs Lab 03/16/16 0820 03/16/16 1949 03/17/16 0103 03/17/16 0512  TROPONINI <0.03 <0.03 <0.03 <0.03   BNP (last 3 results) No results for input(s): PROBNP in the last 8760 hours. HbA1C: No results for input(s): HGBA1C in the last 72 hours. CBG: No results for input(s): GLUCAP in the last 168 hours. Lipid Profile: No results for input(s): CHOL, HDL, LDLCALC, TRIG, CHOLHDL, LDLDIRECT in the last 72 hours. Thyroid Function Tests:  Recent Labs  03/16/16 0820  TSH 5.175*   Anemia Panel:  Recent Labs  03/16/16 0820  VITAMINB12 412   Urine analysis:    Component Value Date/Time   COLORURINE YELLOW 03/16/2016 0948   APPEARANCEUR CLEAR 03/16/2016 0948   LABSPEC <1.005* 03/16/2016 0948   PHURINE 6.0 03/16/2016 0948   GLUCOSEU NEGATIVE 03/16/2016 0948   HGBUR MODERATE* 03/16/2016 0948   BILIRUBINUR NEGATIVE 03/16/2016 0948   KETONESUR NEGATIVE 03/16/2016 0948   PROTEINUR NEGATIVE 03/16/2016 0948   UROBILINOGEN 0.2 04/29/2014 0625   NITRITE NEGATIVE 03/16/2016 0948   LEUKOCYTESUR NEGATIVE 03/16/2016 0948   Sepsis Labs: @LABRCNTIP (procalcitonin:4,lacticidven:4)  )No results found for this or any previous visit (from the past 240 hour(s)).     Radiology Studies: Dg Chest 2 View  03/16/2016  CLINICAL DATA:  Weakness, chest pain EXAM: CHEST  2 VIEW COMPARISON:  03/31/2015 FINDINGS: Borderline cardiomegaly. Limited study by poor inspiration. No acute infiltrate or pleural effusion. No pulmonary edema. Mild degenerative changes thoracic spine. IMPRESSION: No active disease.  Mild degenerative changes thoracic spine. Electronically Signed   By: [name] M.D.   On: 03/16/2016 08:56   Ct Head Wo Contrast  03/16/2016  CLINICAL DATA:  Chest pain this morning, left-sided weakness EXAM: CT  HEAD WITHOUT CONTRAST TECHNIQUE: Contiguous axial images were obtained from the base of the skull through the vertex without intravenous contrast. COMPARISON:  04/27/2013 and brain MRI 04/28/2014 FINDINGS: Brain: No intracranial hemorrhage, mass effect or midline shift. No definite acute cortical infarction. No mass lesion is noted on this unenhanced scan. Stable subtle periventricular tiny foci of white matter decreased attenuation consistent with history of multiple sclerosis without significant change from prior exam. No mass lesion is noted on this unenhanced scan. No intraventricular hemorrhage. Vascular: No hyperdense vessel or unexpected calcification. Skull: Negative for fracture or focal lesion. Sinuses/Orbits: No acute findings. Other: None. IMPRESSION: No acute intracranial abnormality. Stable subtle tiny foci of white matter decreased attenuation probable due to chronic multiple sclerosis. No definite acute cortical  infarction. These results were called by telephone at the time of interpretation on 03/16/2016 at 8:18 am to Dr. Nat Christen , who verbally acknowledged these results. Electronically Signed   By: [name] M.D.   On: 03/16/2016 08:18   Ct Angio Chest Pe W/cm &/or Wo Cm  03/17/2016  CLINICAL DATA:  Fatigue, CT chest pain. Left upper extremity paresthesias. EXAM: CT ANGIOGRAPHY CHEST WITH CONTRAST TECHNIQUE: Multidetector CT imaging of the chest was performed using the standard protocol during bolus administration of intravenous contrast. Multiplanar CT image reconstructions and MIPs were obtained to evaluate the vascular anatomy. CONTRAST:  129mL OMNIPAQUE IOHEXOL 350 MG/ML SOLN COMPARISON:  None. FINDINGS: Cardiovascular: There is good opacification of the pulmonary arteries. There is no pulmonary embolism. The thoracic aorta is normal in caliber and intact. Lungs: There is mild curvilinear scarring or atelectasis in the left base. The lungs are otherwise clear. Central airways: Normal  Effusions: None Lymphadenopathy: None Esophagus: Unremarkable Upper abdomen: Small hiatal hernia Musculoskeletal: No significant abnormality Review of the MIP images confirms the above findings. IMPRESSION: Negative for acute pulmonary embolism.  Small hiatal hernia. Electronically Signed   By: Andreas Newport M.D.   On: 03/17/2016 18:12   Mr Jeri Cos [id number] Contrast  03/17/2016  CLINICAL DATA:  History of MS.  BILATERAL weakness and headache. EXAM: MRI HEAD WITHOUT AND WITH CONTRAST MRI CERVICAL SPINE WITHOUT AND WITH CONTRAST TECHNIQUE: Multiplanar, multiecho pulse sequences of the brain and surrounding structures, and cervical spine, to include the craniocervical junction and cervicothoracic junction, were obtained without and with intravenous contrast. CONTRAST:  81mL MULTIHANCE GADOBENATE DIMEGLUMINE 529 MG/ML IV SOLN COMPARISON:  CT head 03/16/2016. MRI brain and cervical spine 04/28/2014. FINDINGS: MRI HEAD FINDINGS No evidence for acute infarction, hemorrhage, mass lesion, hydrocephalus, or extra-axial fluid. Normal cerebral volume. Scattered subcortical greater than periventricular white matter signal abnormalities, supratentorial, largely subcentimeter, without periventricular predominance. No brainstem or posterior fossa involvement. No progression of disease the from 2015. Post infusion, no abnormal enhancement of the brain or meninges. No midline abnormality.  Extracranial soft tissues unremarkable. MRI CERVICAL SPINE FINDINGS The spinal cord is visualized down to the mid T2 segment. No evidence for disc degeneration, disc herniation, vertebral body abnormality or paraspinous mass. No spinal stenosis or foraminal narrowing on axial images. BILATERAL vertebral arteries are patent, LEFT dominant. T2 weighted imaging through the spinal canal with attention to the cord demonstrates no convincing areas of white matter signal abnormality to suggest spinal multiple sclerosis. There is no cord atrophy or  hydromyelia. There is no tonsillar herniation. No abnormal enhancement of the cord or surrounding structures. IMPRESSION: Stable (since 2015) nonspecific supratentorial white matter lesions. No periventricular predominance, restricted diffusion, or postcontrast enhancement to strongly suggest multiple sclerosis, although in the appropriate clinical setting, these lesions could represent a chronic manifestation of MS. No evidence for spinal MS involving the cervical and upper thoracic segments. No abnormal postcontrast enhancement is visualized of the cord or surrounding structures. No compressive lesion is observed. Electronically Signed   By: Staci Righter M.D.   On: 03/17/2016 18:19   Mr Cervical Spine W Wo Contrast  03/17/2016  CLINICAL DATA:  History of MS.  BILATERAL weakness and headache. EXAM: MRI HEAD WITHOUT AND WITH CONTRAST MRI CERVICAL SPINE WITHOUT AND WITH CONTRAST TECHNIQUE: Multiplanar, multiecho pulse sequences of the brain and surrounding structures, and cervical spine, to include the craniocervical junction and cervicothoracic junction, were obtained without and with intravenous contrast. CONTRAST:  57mL MULTIHANCE GADOBENATE DIMEGLUMINE  529 MG/ML IV SOLN COMPARISON:  CT head 03/16/2016. MRI brain and cervical spine 04/28/2014. FINDINGS: MRI HEAD FINDINGS No evidence for acute infarction, hemorrhage, mass lesion, hydrocephalus, or extra-axial fluid. Normal cerebral volume. Scattered subcortical greater than periventricular white matter signal abnormalities, supratentorial, largely subcentimeter, without periventricular predominance. No brainstem or posterior fossa involvement. No progression of disease the from 2015. Post infusion, no abnormal enhancement of the brain or meninges. No midline abnormality.  Extracranial soft tissues unremarkable. MRI CERVICAL SPINE FINDINGS The spinal cord is visualized down to the mid T2 segment. No evidence for disc degeneration, disc herniation, vertebral body  abnormality or paraspinous mass. No spinal stenosis or foraminal narrowing on axial images. BILATERAL vertebral arteries are patent, LEFT dominant. T2 weighted imaging through the spinal canal with attention to the cord demonstrates no convincing areas of white matter signal abnormality to suggest spinal multiple sclerosis. There is no cord atrophy or hydromyelia. There is no tonsillar herniation. No abnormal enhancement of the cord or surrounding structures. IMPRESSION: Stable (since 2015) nonspecific supratentorial white matter lesions. No periventricular predominance, restricted diffusion, or postcontrast enhancement to strongly suggest multiple sclerosis, although in the appropriate clinical setting, these lesions could represent a chronic manifestation of MS. No evidence for spinal MS involving the cervical and upper thoracic segments. No abnormal postcontrast enhancement is visualized of the cord or surrounding structures. No compressive lesion is observed. Electronically Signed   By: Staci Righter M.D.   On: 03/17/2016 18:19    Scheduled Meds: . enoxaparin (LOVENOX) injection  40 mg Subcutaneous Q24H  . methylPREDNISolone (SOLU-MEDROL) injection  1,000 mg Intravenous Daily  . sodium chloride flush  3 mL Intravenous Q12H   Continuous Infusions: . sodium chloride 75 mL/hr at 03/17/16 1243     LOS: 1 day   Time spent: 25 minutes   Kathie Dike, MD [facility] Pager [phone]  If 7PM-7AM, please contact night-coverage [email or web address] Password [email or web address] 03/18/2016, 7:25 AM   By signing my name below, I, Delene Ruffini, attest that this documentation has been prepared under the direction and in the presence of Kathie Dike, MD. Electronically Signed: Delene Ruffini 03/18/2016 12:55pm  I, Dr. Kathie Dike, personally performed the services described in this documentaiton. All medical record entries made by the scribe were at my direction and in my presence. I have reviewed  the chart and agree that the record reflects my personal performance and is accurate and complete  Kathie Dike, MD, 03/18/2016 1:26 PM

## 2016-03-18 NOTE — Progress Notes (Signed)
[place] A. [name], MD     [email or web address]          Kristine Miller is an 55 y.o. female.   Assessment/Plan: Severe relapse of multiple sclerosis in a patient who is not on immunomodulating therapy/diseased modifying therapy.  Morbid obesity.  RECOMMENDATION: High-dose on Medrol 1 g IV daily for 5 days. Physical and occupational therapy.      The patient reports that she continues to have left-sided weakness. She also reports having right leg weakness today briefly. She reports visual symptoms particularly with detail. She specifically states this when she uses her cell phone. She has not seen an ophthalmologist or optometrist in a long time. She is instructed to do this.   GENERAL: Pleasant and appears to be in some discomfort but in no acute distress.  HEENT: Supple. Atraumatic normocephalic.   ABDOMEN: soft   EXTREMITIES: No edema   BACK: Normal.  SKIN: Normal by inspection.   MENTAL STATUS: Alert and oriented. Speech, language and cognition are generally intact. Judgment and insight normal.   CRANIAL NERVES: Pupils are equal, round and reactive to light and accommodation; extra ocular movements are full, there is no significant nystagmus; visual fields are full; upper and lower facial muscles are normal in strength and symmetric, there is no flattening of the nasolabial folds; tongue is midline; uvula is midline; shoulder elevation is normal.  MOTOR: She has normal tone bulk and strength of the right upper extremity and the right lower extremity. She has left hemiparesis with the left upper extremity graded as 4 minus/5. Left hip flexion 3 and dorsiflexion 2/5.  COORDINATION: Left finger to nose is normal, right finger to nose is normal, No rest tremor; no intention tremor; no postural tremor; no bradykinesia.   SENSATION: This is reduced to light touch and temperature on the left side.     The patient's brain MRI is reviewed in  person. There are about 5 lesions seen in the white matter. The most prominent lesion is a moderate to large size subcortical left frontal lesion. This scan is compared to the previous scan that was done in 2014. This can actually is improved compared to the scan in 2014. That scan showed more larger lesions and about 10 white matter lesions.   The previous workup done in 2014 also showed no oligoclonal bands on CSF analysis.   Objective: Vital signs in last 24 hours: Temp:  [97.8 F (36.6 C)-98.4 F (36.9 C)] 97.8 F (36.6 C) (05/05 1300) Pulse Rate:  [72-100] 72 (05/05 1300) Resp:  [17-20] 17 (05/05 1300) BP: (125-143)/(68-73) 143/73 mmHg (05/05 1300) SpO2:  [92 %-95 %] 95 % (05/05 1300)  Intake/Output from previous day: 05/04 0701 - 05/05 0700 In: 723 [P.O.:720; I.V.:3] Out: 2 [Urine:2] Intake/Output this shift:   Nutritional status: Diet Heart Room service appropriate?: Yes; Fluid consistency:: Thin   Lab Results: Results for orders placed or performed during the hospital encounter of 03/16/16 (from the past 48 hour(s))  CBC     Status: Abnormal   Collection Time: 03/16/16  7:49 PM  Result Value Ref Range   WBC 11.6 (H) 4.0 - 10.5 K/uL   RBC 4.68 3.87 - 5.11 MIL/uL   Hemoglobin 13.4 12.0 - 15.0 g/dL   HCT 41.4 36.0 - 46.0 %   MCV 88.5 78.0 - 100.0 fL   MCH 28.6 26.0 - 34.0 pg   MCHC 32.4 30.0 - 36.0 g/dL   RDW 13.8 11.5 - 15.5 %  Platelets 368 150 - 400 K/uL  Creatinine, serum     Status: None   Collection Time: 03/16/16  7:49 PM  Result Value Ref Range   Creatinine, Ser 0.93 0.44 - 1.00 mg/dL   GFR calc non Af Amer >60 >60 mL/min   GFR calc Af Amer >60 >60 mL/min    Comment: (NOTE) The eGFR has been calculated using the CKD EPI equation. This calculation has not been validated in all clinical situations. eGFR's persistently <60 mL/min signify possible Chronic Kidney Disease.   Troponin I     Status: None   Collection Time: 03/16/16  7:49 PM  Result Value  Ref Range   Troponin I <0.03 <0.031 ng/mL    Comment:        NO INDICATION OF MYOCARDIAL INJURY.   Magnesium     Status: None   Collection Time: 03/16/16  7:49 PM  Result Value Ref Range   Magnesium 1.9 1.7 - 2.4 mg/dL  Troponin I     Status: None   Collection Time: 03/17/16  1:03 AM  Result Value Ref Range   Troponin I <0.03 <0.031 ng/mL    Comment:        NO INDICATION OF MYOCARDIAL INJURY.   Basic metabolic panel     Status: Abnormal   Collection Time: 03/17/16  5:12 AM  Result Value Ref Range   Sodium 141 135 - 145 mmol/L   Potassium 4.4 3.5 - 5.1 mmol/L    Comment: DELTA CHECK NOTED   Chloride 107 101 - 111 mmol/L   CO2 27 22 - 32 mmol/L   Glucose, Bld 107 (H) 65 - 99 mg/dL   BUN 11 6 - 20 mg/dL   Creatinine, Ser 0.85 0.44 - 1.00 mg/dL   Calcium 8.7 (L) 8.9 - 10.3 mg/dL   GFR calc non Af Amer >60 >60 mL/min   GFR calc Af Amer >60 >60 mL/min    Comment: (NOTE) The eGFR has been calculated using the CKD EPI equation. This calculation has not been validated in all clinical situations. eGFR's persistently <60 mL/min signify possible Chronic Kidney Disease.    Anion gap 7 5 - 15  CBC     Status: Abnormal   Collection Time: 03/17/16  5:12 AM  Result Value Ref Range   WBC 11.1 (H) 4.0 - 10.5 K/uL   RBC 4.43 3.87 - 5.11 MIL/uL   Hemoglobin 12.9 12.0 - 15.0 g/dL   HCT 39.7 36.0 - 46.0 %   MCV 89.6 78.0 - 100.0 fL   MCH 29.1 26.0 - 34.0 pg   MCHC 32.5 30.0 - 36.0 g/dL   RDW 14.0 11.5 - 15.5 %   Platelets 338 150 - 400 K/uL  Troponin I     Status: None   Collection Time: 03/17/16  5:12 AM  Result Value Ref Range   Troponin I <0.03 <0.031 ng/mL    Comment:        NO INDICATION OF MYOCARDIAL INJURY.     Lipid Panel No results for input(s): CHOL, TRIG, HDL, CHOLHDL, VLDL, LDLCALC in the last 72 hours.  Studies/Results:   MRI HEAD FINDINGS  No evidence for acute infarction, hemorrhage, mass lesion, hydrocephalus, or extra-axial fluid. Normal cerebral  volume.  Scattered subcortical greater than periventricular white matter signal abnormalities, supratentorial, largely subcentimeter, without periventricular predominance. No brainstem or posterior fossa involvement. No progression of disease the from 2015.  Post infusion, no abnormal enhancement of the brain or meninges.  No midline  abnormality. Extracranial soft tissues unremarkable.  MRI CERVICAL SPINE FINDINGS  The spinal cord is visualized down to the mid T2 segment.  No evidence for disc degeneration, disc herniation, vertebral body abnormality or paraspinous mass. No spinal stenosis or foraminal narrowing on axial images. BILATERAL vertebral arteries are patent, LEFT dominant.  T2 weighted imaging through the spinal canal with attention to the cord demonstrates no convincing areas of white matter signal abnormality to suggest spinal multiple sclerosis. There is no cord atrophy or hydromyelia. There is no tonsillar herniation.  No abnormal enhancement of the cord or surrounding structures.  IMPRESSION: Stable (since 2015) nonspecific supratentorial white matter lesions. No periventricular predominance, restricted diffusion, or postcontrast enhancement to strongly suggest multiple sclerosis, although in the appropriate clinical setting, these lesions could represent a chronic manifestation of MS.  No evidence for spinal MS involving the cervical and upper thoracic segments. No abnormal postcontrast enhancement is visualized of the cord or surrounding structures. No compressive lesion is observed.    Medications:  Scheduled Meds: . enoxaparin (LOVENOX) injection  40 mg Subcutaneous Q24H  . methylPREDNISolone (SOLU-MEDROL) injection  1,000 mg Intravenous Daily  . sodium chloride flush  3 mL Intravenous Q12H   Continuous Infusions: . sodium chloride 75 mL/hr at 03/18/16 1726   PRN Meds:.acetaminophen **OR** acetaminophen, HYDROcodone-acetaminophen,  ondansetron **OR** ondansetron (ZOFRAN) IV, tiZANidine     LOS: 1 day   Jody Aguinaga A. [name], M.D.  Diplomate, Tax adviser of Psychiatry and Neurology ( Neurology).

## 2016-03-18 NOTE — Progress Notes (Signed)
PT Cancellation Note  Patient Details Name: Kristine Miller MRN: [id number] DOB: 1961-11-13   Cancelled Treatment:    Reason Eval/Treat Not Completed: Other (comment) Pt refused, stating that it had been a really bad day, and her R LE had totally given out on her.  She has been getting to and from the bathroom with her husband's assistance.  Encouraged pt to call for staff assistance, and requested pt to have RW brought into the hospital.  Pt stated she would ask her husband to bring her RW in tonight when he comes in.  HEP given to pt to perform on her own and includes the following:    [place] on your back with knees straight, slide the affected heel towards your buttock as you bend your knee. Hold a gentle stretch in this position and then return to original position.   SEATED LONG ARC QUAD While seated in a chair or on side of a bed, allow the lower leg to hang down so the knee is at a 90 degree angle. Straighten the leg until the knee is locked. Repeat  ELASTIC BAND - SUPINE HIP ABDUCTION While lying on your back, slowly bring your leg out to the side. Keep your knee straight the entire time.  Ankle Pumps Pump your feet up and down.   BRIDGING While lying on your back, tighten your lower abdominals, squeeze your buttocks and then raise your buttocks off the floor/bed as creating a "Bridge" with your body. Hold and then lower yourself and repeat.  Beth Toua Stites, PT, DPT X: [phone]   03/18/2016, 3:41 PM

## 2016-03-19 MED ORDER — ALPRAZOLAM 0.5 MG PO TABS
0.5000 mg | ORAL_TABLET | Freq: Three times a day (TID) | ORAL | Status: DC | PRN
  Administered 2016-03-19 – 2016-03-20 (×4): 0.5 mg via ORAL
  Filled 2016-03-19 (×4): qty 1

## 2016-03-19 NOTE — Progress Notes (Signed)
PROGRESS NOTE    Kristine Miller  [id number] DOB: 11-06-1961 DOA: 03/16/2016 PCP: [name], MD Outpatient Specialists:    Brief Narrative:  42 yof with ahx of MS presented to the ED with multiple complaints. She initially began experiencing increasing fatigue and somnolence and subsequently developed lower extremity weakness with progressed to bilateral lower extremity weakness. She has also been experiencing a dull aching discomfort in her chest, numbness in her jaw. She was referred for further observation.    Assessment & Plan:   Active Problems:   Chest pain   Hypokalemia   MS (multiple sclerosis) (HCC)   Numbness   Left-sided weakness   1. Left-sided weakness. Related to a flare of her multiple sclerosis. CT head unremarkable. Neurology has seen the pt and has started her on high dose steroids. She is on Day 3 of 5 of steroids. MRI imaging of the brain and c-spine were unremarkable. Will continue current tx. She feels that her left arm weakness is improving.  2. Chest pain, atypical. Suspect this is also related to MS. EKG is unremarkable. Troponin negative. She has multiple family members who have recently had PE. D-dimer is elevated. CT angio negative for PE, but did show small hiatal hernia. Continue to monitor on telemetry. 3. Hypokalemia. Replaced. Improved. 4. Multiple sclerosis. She is not on any specific treatment. She has seen Dr. [name] in the past, but has been unable to follow up due to lack of insurance. Discussed with the patient the available options for medications and recommended that she meet with Dr. [name] as an outpatient. Continue high dose steroids.   DVT prophylaxis: Lovenox Code Status: Full Family Communication: discussed with patient, no family at bedside. Disposition Plan: Discharge home once improved   Consultants:   Neurology  Procedures:   none  Antimicrobials:   none    Subjective: Feels improved. She no longer feels  anxious. She reports that her arm is still tingling but is improving. Her lower extremity weakness has improved as well. She was able to go to the bathroom by herself. Is urinating well.  Objective: Filed Vitals:   03/18/16 0511 03/18/16 1300 03/18/16 2107 03/19/16 0525  BP: 125/68 143/73 114/56 128/85  Pulse: 85 72 64 60  Temp: 98.3 F (36.8 C) 97.8 F (36.6 C) 97.9 F (36.6 C) 98 F (36.7 C)  TempSrc: Oral Oral Oral Oral  Resp: 18 17 20 20   Height:      Weight:      SpO2: 92% 95% 95% 95%    Intake/Output Summary (Last 24 hours) at 03/19/16 0744 Last data filed at 03/19/16 0525  Gross per 24 hour  Intake    600 ml  Output      4 ml  Net    596 ml   Filed Weights   03/16/16 0750 03/16/16 1301  Weight: 103.42 kg (228 lb) 103.42 kg (228 lb)   Examination:  General exam: Appears calm and comfortable  Respiratory system: Clear to auscultation. Respiratory effort normal. Cardiovascular system: S1 & S2 heard, RRR. No JVD, murmurs, rubs, gallops or clicks. No pedal edema. Gastrointestinal system: Abdomen is nondistended, soft and nontender. No organomegaly or masses felt. Normal bowel sounds heard. Central nervous system: Alert and oriented. No focal neurological deficits. Extremities: Symmetric 5 x 5 power. Skin: No rashes, lesions or ulcers Psychiatry: Judgement and insight appear normal. Mood & affect appropriate.   Data Reviewed: I have personally reviewed following labs and imaging studies  CBC:  Recent  Labs Lab 03/16/16 0820 03/16/16 0834 03/16/16 1949 03/17/16 0512  WBC 12.9*  --  11.6* 11.1*  NEUTROABS 7.8*  --   --   --   HGB 14.3 15.6* 13.4 12.9  HCT 42.5 46.0 41.4 39.7  MCV 87.8  --  88.5 89.6  PLT 337  --  368 [id number]   Basic Metabolic Panel:  Recent Labs Lab 03/16/16 0800 03/16/16 0834 03/16/16 1949 03/17/16 0512  NA 142 141  --  141  K 3.3* 3.3*  --  4.4  CL 103 102  --  107  CO2 29  --   --  27  GLUCOSE 130* 122*  --  107*  BUN 14 16  --  11    CREATININE 0.92 0.80 0.93 0.85  CALCIUM 9.0  --   --  8.7*  MG  --   --  1.9  --    GFR: Estimated Creatinine Clearance: 83 mL/min (by C-G formula based on Cr of 0.85). Liver Function Tests: No results for input(s): AST, ALT, ALKPHOS, BILITOT, PROT, ALBUMIN in the last 168 hours. No results for input(s): LIPASE, AMYLASE in the last 168 hours. No results for input(s): AMMONIA in the last 168 hours. Coagulation Profile:  Recent Labs Lab 03/16/16 0820  INR 1.00   Cardiac Enzymes:  Recent Labs Lab 03/16/16 0820 03/16/16 1949 03/17/16 0103 03/17/16 0512  TROPONINI <0.03 <0.03 <0.03 <0.03   BNP (last 3 results) No results for input(s): PROBNP in the last 8760 hours. HbA1C: No results for input(s): HGBA1C in the last 72 hours. CBG: No results for input(s): GLUCAP in the last 168 hours. Lipid Profile: No results for input(s): CHOL, HDL, LDLCALC, TRIG, CHOLHDL, LDLDIRECT in the last 72 hours. Thyroid Function Tests:  Recent Labs  03/16/16 0820  TSH 5.175*   Anemia Panel:  Recent Labs  03/16/16 0820  VITAMINB12 412   Urine analysis:    Component Value Date/Time   COLORURINE YELLOW 03/16/2016 0948   APPEARANCEUR CLEAR 03/16/2016 0948   LABSPEC <1.005* 03/16/2016 0948   PHURINE 6.0 03/16/2016 0948   GLUCOSEU NEGATIVE 03/16/2016 0948   HGBUR MODERATE* 03/16/2016 0948   BILIRUBINUR NEGATIVE 03/16/2016 0948   KETONESUR NEGATIVE 03/16/2016 0948   PROTEINUR NEGATIVE 03/16/2016 0948   UROBILINOGEN 0.2 04/29/2014 0625   NITRITE NEGATIVE 03/16/2016 0948   LEUKOCYTESUR NEGATIVE 03/16/2016 0948   Sepsis Labs: @LABRCNTIP (procalcitonin:4,lacticidven:4)  )No results found for this or any previous visit (from the past 240 hour(s)).     Radiology Studies: Ct Angio Chest Pe W/cm &/or Wo Cm  03/17/2016  CLINICAL DATA:  Fatigue, CT chest pain. Left upper extremity paresthesias. EXAM: CT ANGIOGRAPHY CHEST WITH CONTRAST TECHNIQUE: Multidetector CT imaging of the chest was  performed using the standard protocol during bolus administration of intravenous contrast. Multiplanar CT image reconstructions and MIPs were obtained to evaluate the vascular anatomy. CONTRAST:  178mL OMNIPAQUE IOHEXOL 350 MG/ML SOLN COMPARISON:  None. FINDINGS: Cardiovascular: There is good opacification of the pulmonary arteries. There is no pulmonary embolism. The thoracic aorta is normal in caliber and intact. Lungs: There is mild curvilinear scarring or atelectasis in the left base. The lungs are otherwise clear. Central airways: Normal Effusions: None Lymphadenopathy: None Esophagus: Unremarkable Upper abdomen: Small hiatal hernia Musculoskeletal: No significant abnormality Review of the MIP images confirms the above findings. IMPRESSION: Negative for acute pulmonary embolism.  Small hiatal hernia. Electronically Signed   By: Andreas Newport M.D.   On: 03/17/2016 18:12   Mr Jeri Cos  Wo Contrast  03/17/2016  CLINICAL DATA:  History of MS.  BILATERAL weakness and headache. EXAM: MRI HEAD WITHOUT AND WITH CONTRAST MRI CERVICAL SPINE WITHOUT AND WITH CONTRAST TECHNIQUE: Multiplanar, multiecho pulse sequences of the brain and surrounding structures, and cervical spine, to include the craniocervical junction and cervicothoracic junction, were obtained without and with intravenous contrast. CONTRAST:  79mL MULTIHANCE GADOBENATE DIMEGLUMINE 529 MG/ML IV SOLN COMPARISON:  CT head 03/16/2016. MRI brain and cervical spine 04/28/2014. FINDINGS: MRI HEAD FINDINGS No evidence for acute infarction, hemorrhage, mass lesion, hydrocephalus, or extra-axial fluid. Normal cerebral volume. Scattered subcortical greater than periventricular white matter signal abnormalities, supratentorial, largely subcentimeter, without periventricular predominance. No brainstem or posterior fossa involvement. No progression of disease the from 2015. Post infusion, no abnormal enhancement of the brain or meninges. No midline abnormality.   Extracranial soft tissues unremarkable. MRI CERVICAL SPINE FINDINGS The spinal cord is visualized down to the mid T2 segment. No evidence for disc degeneration, disc herniation, vertebral body abnormality or paraspinous mass. No spinal stenosis or foraminal narrowing on axial images. BILATERAL vertebral arteries are patent, LEFT dominant. T2 weighted imaging through the spinal canal with attention to the cord demonstrates no convincing areas of white matter signal abnormality to suggest spinal multiple sclerosis. There is no cord atrophy or hydromyelia. There is no tonsillar herniation. No abnormal enhancement of the cord or surrounding structures. IMPRESSION: Stable (since 2015) nonspecific supratentorial white matter lesions. No periventricular predominance, restricted diffusion, or postcontrast enhancement to strongly suggest multiple sclerosis, although in the appropriate clinical setting, these lesions could represent a chronic manifestation of MS. No evidence for spinal MS involving the cervical and upper thoracic segments. No abnormal postcontrast enhancement is visualized of the cord or surrounding structures. No compressive lesion is observed. Electronically Signed   By: Staci Righter M.D.   On: 03/17/2016 18:19   Mr Cervical Spine W Wo Contrast  03/17/2016  CLINICAL DATA:  History of MS.  BILATERAL weakness and headache. EXAM: MRI HEAD WITHOUT AND WITH CONTRAST MRI CERVICAL SPINE WITHOUT AND WITH CONTRAST TECHNIQUE: Multiplanar, multiecho pulse sequences of the brain and surrounding structures, and cervical spine, to include the craniocervical junction and cervicothoracic junction, were obtained without and with intravenous contrast. CONTRAST:  38mL MULTIHANCE GADOBENATE DIMEGLUMINE 529 MG/ML IV SOLN COMPARISON:  CT head 03/16/2016. MRI brain and cervical spine 04/28/2014. FINDINGS: MRI HEAD FINDINGS No evidence for acute infarction, hemorrhage, mass lesion, hydrocephalus, or extra-axial fluid. Normal  cerebral volume. Scattered subcortical greater than periventricular white matter signal abnormalities, supratentorial, largely subcentimeter, without periventricular predominance. No brainstem or posterior fossa involvement. No progression of disease the from 2015. Post infusion, no abnormal enhancement of the brain or meninges. No midline abnormality.  Extracranial soft tissues unremarkable. MRI CERVICAL SPINE FINDINGS The spinal cord is visualized down to the mid T2 segment. No evidence for disc degeneration, disc herniation, vertebral body abnormality or paraspinous mass. No spinal stenosis or foraminal narrowing on axial images. BILATERAL vertebral arteries are patent, LEFT dominant. T2 weighted imaging through the spinal canal with attention to the cord demonstrates no convincing areas of white matter signal abnormality to suggest spinal multiple sclerosis. There is no cord atrophy or hydromyelia. There is no tonsillar herniation. No abnormal enhancement of the cord or surrounding structures. IMPRESSION: Stable (since 2015) nonspecific supratentorial white matter lesions. No periventricular predominance, restricted diffusion, or postcontrast enhancement to strongly suggest multiple sclerosis, although in the appropriate clinical setting, these lesions could represent a chronic manifestation of MS. No evidence for  spinal MS involving the cervical and upper thoracic segments. No abnormal postcontrast enhancement is visualized of the cord or surrounding structures. No compressive lesion is observed. Electronically Signed   By: Staci Righter M.D.   On: 03/17/2016 18:19    Scheduled Meds: . enoxaparin (LOVENOX) injection  40 mg Subcutaneous Q24H  . methylPREDNISolone (SOLU-MEDROL) injection  1,000 mg Intravenous Daily  . sodium chloride flush  3 mL Intravenous Q12H   Continuous Infusions: . sodium chloride 75 mL/hr at 03/18/16 1726     LOS: 2 days   Time spent: 25 minutes   Kathie Dike, MD [facility] Pager [phone]  If 7PM-7AM, please contact night-coverage [email or web address] Password [email or web address] 03/19/2016, 7:44 AM   By signing my name below, I, Delene Ruffini, attest that this documentation has been prepared under the direction and in the presence of Kathie Dike, MD. Electronically Signed: Delene Ruffini 03/19/2016 2:45pm  I, Dr. Kathie Dike, personally performed the services described in this documentaiton. All medical record entries made by the scribe were at my direction and in my presence. I have reviewed the chart and agree that the record reflects my personal performance and is accurate and complete  Kathie Dike, MD, 03/19/2016 2:54 PM

## 2016-03-20 DIAGNOSIS — R200 Anesthesia of skin: Secondary | ICD-10-CM

## 2016-03-20 NOTE — Progress Notes (Signed)
PROGRESS NOTE    Kristine Miller  [id number] DOB: 1961/08/22 DOA: 03/16/2016 PCP: [name], MD Outpatient Specialists:    Brief Narrative:  66 yof with ahx of MS presented to the ED with multiple complaints. She initially began experiencing increasing fatigue and somnolence and subsequently developed lower extremity weakness with progressed to bilateral lower extremity weakness. She has also been experiencing a dull aching discomfort in her chest, numbness in her jaw. She was referred for further observation.    Assessment & Plan:   Active Problems:   Chest pain   Hypokalemia   MS (multiple sclerosis) (HCC)   Numbness   Left-sided weakness   1. Left-sided weakness. Related to a flare of her multiple sclerosis. CT head unremarkable. Neurology has seen the pt and has started her on high dose steroids. She is on Day 4 of 5 of steroids. MRI imaging of the brain and c-spine were unremarkable. Will continue current tx. She feels that her left arm weakness is improving.  2. Chest pain, atypical. Suspect this is also related to MS. EKG is unremarkable. Troponin negative. She has multiple family members who have recently had PE. D-dimer is elevated. CT angio negative for PE, but did show small hiatal hernia.  3. Hypokalemia. Replaced. Improved. 4. Multiple sclerosis. She is not on any specific treatment. She has seen Dr. [name] in the past, but has been unable to follow up due to lack of insurance. Discussed with the patient the available options for medications and recommended that she meet with Dr. [name] as an outpatient. Continue high dose steroids.   DVT prophylaxis: Lovenox Code Status: Full Family Communication: discussed with patient, no family at bedside. Disposition Plan: Discharge home once improved   Consultants:   Neurology  PT  Procedures:   none  Antimicrobials:   none    Subjective: Feeling improved. Denies any anxiety and her numbness has  resolved. Still has some mild weakness in her lower extremities but states it is improved from yesterday.   Objective: Filed Vitals:   03/19/16 0525 03/19/16 1556 03/19/16 2040 03/20/16 0551  BP: 128/85 141/68 148/68 132/60  Pulse: 60 67 85 78  Temp: 98 F (36.7 C) 98.7 F (37.1 C) 98.1 F (36.7 C) 98.1 F (36.7 C)  TempSrc: Oral Oral Oral Oral  Resp: 20 20 18 18   Height:      Weight:      SpO2: 95% 97% 93% 94%    Intake/Output Summary (Last 24 hours) at 03/20/16 0810 Last data filed at 03/19/16 1730  Gross per 24 hour  Intake   5829 ml  Output      0 ml  Net   5829 ml   Filed Weights   03/16/16 0750 03/16/16 1301  Weight: 103.42 kg (228 lb) 103.42 kg (228 lb)   Examination:  General exam: Appears calm and comfortable  Respiratory system: Clear to auscultation. Respiratory effort normal. Cardiovascular system: S1 & S2 heard, RRR. No JVD, murmurs, rubs, gallops or clicks. No pedal edema. Gastrointestinal system: Abdomen is nondistended, soft and nontender. No organomegaly or masses felt. Normal bowel sounds heard. Central nervous system: Alert and oriented. No focal neurological deficits. Extremities: Symmetric 5 x 5 power. Skin: No rashes, lesions or ulcers Psychiatry: Judgement and insight appear normal. Mood & affect appropriate.   Data Reviewed: I have personally reviewed following labs and imaging studies  CBC:  Recent Labs Lab 03/16/16 0820 03/16/16 0834 03/16/16 1949 03/17/16 0512  WBC 12.9*  --  11.6* 11.1*  NEUTROABS 7.8*  --   --   --   HGB 14.3 15.6* 13.4 12.9  HCT 42.5 46.0 41.4 39.7  MCV 87.8  --  88.5 89.6  PLT 337  --  368 Q000111Q   Basic Metabolic Panel:  Recent Labs Lab 03/16/16 0800 03/16/16 0834 03/16/16 1949 03/17/16 0512  NA 142 141  --  141  K 3.3* 3.3*  --  4.4  CL 103 102  --  107  CO2 29  --   --  27  GLUCOSE 130* 122*  --  107*  BUN 14 16  --  11  CREATININE 0.92 0.80 0.93 0.85  CALCIUM 9.0  --   --  8.7*  MG  --   --  1.9   --    Coagulation Profile:  Recent Labs Lab 03/16/16 0820  INR 1.00   Cardiac Enzymes:  Recent Labs Lab 03/16/16 0820 03/16/16 1949 03/17/16 0103 03/17/16 0512  TROPONINI <0.03 <0.03 <0.03 <0.03   Urine analysis:    Component Value Date/Time   COLORURINE YELLOW 03/16/2016 0948   APPEARANCEUR CLEAR 03/16/2016 0948   LABSPEC <1.005* 03/16/2016 0948   PHURINE 6.0 03/16/2016 0948   GLUCOSEU NEGATIVE 03/16/2016 0948   HGBUR MODERATE* 03/16/2016 0948   BILIRUBINUR NEGATIVE 03/16/2016 0948   KETONESUR NEGATIVE 03/16/2016 0948   PROTEINUR NEGATIVE 03/16/2016 0948   UROBILINOGEN 0.2 04/29/2014 0625   NITRITE NEGATIVE 03/16/2016 0948   LEUKOCYTESUR NEGATIVE 03/16/2016 0948    Scheduled Meds: . enoxaparin (LOVENOX) injection  40 mg Subcutaneous Q24H  . methylPREDNISolone (SOLU-MEDROL) injection  1,000 mg Intravenous Daily  . sodium chloride flush  3 mL Intravenous Q12H   Continuous Infusions:     LOS: 3 days   Time spent: 25 minutes   Kathie Dike, MD [facility] Pager [phone]  If 7PM-7AM, please contact night-coverage [email or web address] Password [email or web address] 03/20/2016, 8:10 AM    By signing my name below, I, Rosalie Doctor, attest that this documentation has been prepared under the direction and in the presence of [facility]. MD Electronically Signed: Rosalie Doctor, Scribe. 03/20/2016 11:59am  I, Dr. Kathie Dike, personally performed the services described in this documentaiton. All medical record entries made by the scribe were at my direction and in my presence. I have reviewed the chart and agree that the record reflects my personal performance and is accurate and complete  Kathie Dike, MD, 03/20/2016 12:07 PM

## 2016-03-21 NOTE — Discharge Summary (Signed)
Physician Discharge Summary  Kristine Miller [id number] DOB: 1961/02/21 DOA: 03/16/2016  PCP: [name], MD  Admit date: 03/16/2016 Discharge date: 03/21/2016  Time spent: 35 minutes  Recommendations for Outpatient Follow-up:  1. Follow up with PCP in 1-2 weeks.  2. Follow up with neurology as outpatient.   Discharge Diagnoses:  Active Problems:   Chest pain   Hypokalemia   MS (multiple sclerosis) (HCC)   Numbness   Left-sided weakness   Discharge Condition: Improved.   Diet recommendation: heart healthy   Filed Weights   03/16/16 0750 03/16/16 1301  Weight: 103.42 kg (228 lb) 103.42 kg (228 lb)    History of present illness:  37 yof with history of MS presented to the ED with multiple complaints. She initially began experiencing increasing fatigue and somnolence and subsequently developed lower extremity weakness which progressed with bilateral lower extremity weakness. She has also has been experiencing a dull aching discomfort in her chest, numbness in her jaw. She was referred for further evaluation.   Hospital Course:  Patient was admitted for fatigue, somnolence, and  left-sided weakness. Related to a flare of her multiple sclerosis. CT head unremarkable. Neurology evaluated the pt and started her on high dose steroids, for which she has completed the 5 day course. MRI imaging of the brain and c-spine were unremarkable. On the day of discharge, she was seen by neurology and felt stable to discharge home. She does continue to have some weakness, although it is better than admission. She will follow up with neurology for further management. She will need to continue with physical therapy   Chest pain, atypical. Suspect this is also related to MS. EKG is unremarkable. Troponin negative. She has multiple family members who have recently had PE. D-dimer is elevated. CT angio negative for PE, but did show small hiatal hernia.   Hypokalemia. Resolved.  Multiple sclerosis.  She is not on any specific treatment. She has seen Dr. [name] in the past, but has been unable to follow up due to lack of insurance. Discussed with the patient the available options for medications and recommended that she meet with Dr. [name] as an outpatient.She has completed a 5 day course of high dose steroids and will follow up with neurology for further management.   Procedures:  None  Consultations:  Neurology   PT  Discharge Exam: Filed Vitals:   03/20/16 2128 03/21/16 0545  BP: 139/62 130/77  Pulse: 65 63  Temp: 97.4 F (36.3 C) 98.2 F (36.8 C)  Resp: 20 20     General: NAD, looks comfortable  Cardiovascular: RRR, S1, S2   Respiratory: clear bilaterally, No wheezing, rales or rhonchi  Abdomen: soft, non tender, no distention , bowel sounds normal  Musculoskeletal: No edema b/l  Discharge Instructions    Current Discharge Medication List    CONTINUE these medications which have NOT CHANGED   Details  acetaminophen (TYLENOL) 500 MG tablet Take 1,000 mg by mouth every 6 (six) hours as needed for headache.    aspirin-acetaminophen-caffeine (EXCEDRIN MIGRAINE) 250-250-65 MG tablet Take 2 tablets by mouth every 6 (six) hours as needed for headache.    OVER THE COUNTER MEDICATION Take 1 capsule by mouth daily. Essential oil "DDR" for MS.       Allergies  Allergen Reactions  . Morphine And Related Swelling  . Penicillins Nausea And Vomiting    Has patient had a PCN reaction causing immediate rash, facial/tongue/throat swelling, SOB or lightheadedness with hypotension: No Has patient  had a PCN reaction causing severe rash involving mucus membranes or skin necrosis: No Has patient had a PCN reaction that required hospitalization No Has patient had a PCN reaction occurring within the last 10 years: No If all of the above answers are "NO", then may proceed with Cephalosporin use.       The results of significant diagnostics from this hospitalization  (including imaging, microbiology, ancillary and laboratory) are listed below for reference.    Significant Diagnostic Studies: Dg Chest 2 View  03/16/2016  CLINICAL DATA:  Weakness, chest pain EXAM: CHEST  2 VIEW COMPARISON:  03/31/2015 FINDINGS: Borderline cardiomegaly. Limited study by poor inspiration. No acute infiltrate or pleural effusion. No pulmonary edema. Mild degenerative changes thoracic spine. IMPRESSION: No active disease.  Mild degenerative changes thoracic spine. Electronically Signed   By: [name] M.D.   On: 03/16/2016 08:56   Ct Head Wo Contrast  03/16/2016  CLINICAL DATA:  Chest pain this morning, left-sided weakness EXAM: CT HEAD WITHOUT CONTRAST TECHNIQUE: Contiguous axial images were obtained from the base of the skull through the vertex without intravenous contrast. COMPARISON:  04/27/2013 and brain MRI 04/28/2014 FINDINGS: Brain: No intracranial hemorrhage, mass effect or midline shift. No definite acute cortical infarction. No mass lesion is noted on this unenhanced scan. Stable subtle periventricular tiny foci of white matter decreased attenuation consistent with history of multiple sclerosis without significant change from prior exam. No mass lesion is noted on this unenhanced scan. No intraventricular hemorrhage. Vascular: No hyperdense vessel or unexpected calcification. Skull: Negative for fracture or focal lesion. Sinuses/Orbits: No acute findings. Other: None. IMPRESSION: No acute intracranial abnormality. Stable subtle tiny foci of white matter decreased attenuation probable due to chronic multiple sclerosis. No definite acute cortical infarction. These results were called by telephone at the time of interpretation on 03/16/2016 at 8:18 am to Dr. Nat Christen , who verbally acknowledged these results. Electronically Signed   By: [name] M.D.   On: 03/16/2016 08:18   Ct Angio Chest Pe W/cm &/or Wo Cm  03/17/2016  CLINICAL DATA:  Fatigue, CT chest pain. Left upper extremity  paresthesias. EXAM: CT ANGIOGRAPHY CHEST WITH CONTRAST TECHNIQUE: Multidetector CT imaging of the chest was performed using the standard protocol during bolus administration of intravenous contrast. Multiplanar CT image reconstructions and MIPs were obtained to evaluate the vascular anatomy. CONTRAST:  125mL OMNIPAQUE IOHEXOL 350 MG/ML SOLN COMPARISON:  None. FINDINGS: Cardiovascular: There is good opacification of the pulmonary arteries. There is no pulmonary embolism. The thoracic aorta is normal in caliber and intact. Lungs: There is mild curvilinear scarring or atelectasis in the left base. The lungs are otherwise clear. Central airways: Normal Effusions: None Lymphadenopathy: None Esophagus: Unremarkable Upper abdomen: Small hiatal hernia Musculoskeletal: No significant abnormality Review of the MIP images confirms the above findings. IMPRESSION: Negative for acute pulmonary embolism.  Small hiatal hernia. Electronically Signed   By: Andreas Newport M.D.   On: 03/17/2016 18:12   Mr Jeri Cos [id number] Contrast  03/17/2016  CLINICAL DATA:  History of MS.  BILATERAL weakness and headache. EXAM: MRI HEAD WITHOUT AND WITH CONTRAST MRI CERVICAL SPINE WITHOUT AND WITH CONTRAST TECHNIQUE: Multiplanar, multiecho pulse sequences of the brain and surrounding structures, and cervical spine, to include the craniocervical junction and cervicothoracic junction, were obtained without and with intravenous contrast. CONTRAST:  92mL MULTIHANCE GADOBENATE DIMEGLUMINE 529 MG/ML IV SOLN COMPARISON:  CT head 03/16/2016. MRI brain and cervical spine 04/28/2014. FINDINGS: MRI HEAD FINDINGS No evidence for acute  infarction, hemorrhage, mass lesion, hydrocephalus, or extra-axial fluid. Normal cerebral volume. Scattered subcortical greater than periventricular white matter signal abnormalities, supratentorial, largely subcentimeter, without periventricular predominance. No brainstem or posterior fossa involvement. No progression of disease  the from 2015. Post infusion, no abnormal enhancement of the brain or meninges. No midline abnormality.  Extracranial soft tissues unremarkable. MRI CERVICAL SPINE FINDINGS The spinal cord is visualized down to the mid T2 segment. No evidence for disc degeneration, disc herniation, vertebral body abnormality or paraspinous mass. No spinal stenosis or foraminal narrowing on axial images. BILATERAL vertebral arteries are patent, LEFT dominant. T2 weighted imaging through the spinal canal with attention to the cord demonstrates no convincing areas of white matter signal abnormality to suggest spinal multiple sclerosis. There is no cord atrophy or hydromyelia. There is no tonsillar herniation. No abnormal enhancement of the cord or surrounding structures. IMPRESSION: Stable (since 2015) nonspecific supratentorial white matter lesions. No periventricular predominance, restricted diffusion, or postcontrast enhancement to strongly suggest multiple sclerosis, although in the appropriate clinical setting, these lesions could represent a chronic manifestation of MS. No evidence for spinal MS involving the cervical and upper thoracic segments. No abnormal postcontrast enhancement is visualized of the cord or surrounding structures. No compressive lesion is observed. Electronically Signed   By: Staci Righter M.D.   On: 03/17/2016 18:19   Mr Cervical Spine W Wo Contrast  03/17/2016  CLINICAL DATA:  History of MS.  BILATERAL weakness and headache. EXAM: MRI HEAD WITHOUT AND WITH CONTRAST MRI CERVICAL SPINE WITHOUT AND WITH CONTRAST TECHNIQUE: Multiplanar, multiecho pulse sequences of the brain and surrounding structures, and cervical spine, to include the craniocervical junction and cervicothoracic junction, were obtained without and with intravenous contrast. CONTRAST:  4mL MULTIHANCE GADOBENATE DIMEGLUMINE 529 MG/ML IV SOLN COMPARISON:  CT head 03/16/2016. MRI brain and cervical spine 04/28/2014. FINDINGS: MRI HEAD FINDINGS  No evidence for acute infarction, hemorrhage, mass lesion, hydrocephalus, or extra-axial fluid. Normal cerebral volume. Scattered subcortical greater than periventricular white matter signal abnormalities, supratentorial, largely subcentimeter, without periventricular predominance. No brainstem or posterior fossa involvement. No progression of disease the from 2015. Post infusion, no abnormal enhancement of the brain or meninges. No midline abnormality.  Extracranial soft tissues unremarkable. MRI CERVICAL SPINE FINDINGS The spinal cord is visualized down to the mid T2 segment. No evidence for disc degeneration, disc herniation, vertebral body abnormality or paraspinous mass. No spinal stenosis or foraminal narrowing on axial images. BILATERAL vertebral arteries are patent, LEFT dominant. T2 weighted imaging through the spinal canal with attention to the cord demonstrates no convincing areas of white matter signal abnormality to suggest spinal multiple sclerosis. There is no cord atrophy or hydromyelia. There is no tonsillar herniation. No abnormal enhancement of the cord or surrounding structures. IMPRESSION: Stable (since 2015) nonspecific supratentorial white matter lesions. No periventricular predominance, restricted diffusion, or postcontrast enhancement to strongly suggest multiple sclerosis, although in the appropriate clinical setting, these lesions could represent a chronic manifestation of MS. No evidence for spinal MS involving the cervical and upper thoracic segments. No abnormal postcontrast enhancement is visualized of the cord or surrounding structures. No compressive lesion is observed. Electronically Signed   By: Staci Righter M.D.   On: 03/17/2016 18:19    Microbiology: No results found for this or any previous visit (from the past 240 hour(s)).   Labs: Basic Metabolic Panel:  Recent Labs Lab 03/16/16 0800 03/16/16 0834 03/16/16 1949 03/17/16 0512  NA 142 141  --  141  K 3.3* 3.3*   --  4.4  CL 103 102  --  107  CO2 29  --   --  27  GLUCOSE 130* 122*  --  107*  BUN 14 16  --  11  CREATININE 0.92 0.80 0.93 0.85  CALCIUM 9.0  --   --  8.7*  MG  --   --  1.9  --    CBC:  Recent Labs Lab 03/16/16 0820 03/16/16 0834 03/16/16 1949 03/17/16 0512  WBC 12.9*  --  11.6* 11.1*  NEUTROABS 7.8*  --   --   --   HGB 14.3 15.6* 13.4 12.9  HCT 42.5 46.0 41.4 39.7  MCV 87.8  --  88.5 89.6  PLT 337  --  368 338   Cardiac Enzymes:  Recent Labs Lab 03/16/16 0820 03/16/16 1949 03/17/16 0103 03/17/16 0512  TROPONINI <0.03 <0.03 <0.03 <0.03    Signed:  Kathie Dike, MD [facility] 03/21/2016, 8:22 AM

## 2016-03-21 NOTE — Care Management Note (Signed)
Case Management Note  Patient Details  Name: Kristine Miller MRN: [id number] Date of Birth: 06/26/1961  Subjective/Objective: Spoke with patient for discharge planning who is from home with husband. Patient does not qualify for Medicaid due to husbands income. Patient stated that she is on no home meds. She stated that she see Dr Doralee Albino and Dr Rosalio Loud. Patient is day five of steroids for MS and will be discharged today. Patient has walker and wheelchair at home, does not drive but stated that husband takes her to appointments.  Do not anticipate that patient will be on discharge medications but will monitor discharge for needs.  No CM needs identified.                  Action/Plan:  Anticipated discharge is home with self care.   Expected Discharge Date:  03/16/16               Expected Discharge Plan:  Home/Self Care  In-House Referral:     Discharge planning Services  CM Consult  Post Acute Care Choice:    Choice offered to:  NA  DME Arranged:  N/A DME Agency:  NA  HH Arranged:  NA HH Agency:  NA  Status of Service:  Completed, signed off  Medicare Important Message Given:    Date Medicare IM Given:    Medicare IM give by:    Date Additional Medicare IM Given:    Additional Medicare Important Message give by:     If discussed at [place] of Stay Meetings, dates discussed:    Additional Comments:  Alvie Heidelberg, RN 03/21/2016, 1:59 PM

## 2016-03-21 NOTE — Progress Notes (Signed)
Kristine Miller discharged Home per MD order.  Discharge instructions reviewed and discussed with the patient, all questions and concerns answered. Copy of instructions given to patient.    Medication List    TAKE these medications        acetaminophen 500 MG tablet  Commonly known as:  TYLENOL  Take 1,000 mg by mouth every 6 (six) hours as needed for headache.     aspirin-acetaminophen-caffeine 250-250-65 MG tablet  Commonly known as:  EXCEDRIN MIGRAINE  Take 2 tablets by mouth every 6 (six) hours as needed for headache.     OVER THE COUNTER MEDICATION  Take 1 capsule by mouth daily. Essential oil "DDR" for MS.        Patients skin is clean, dry and intact, no evidence of skin break down. IV site discontinued and catheter remains intact. Site without signs and symptoms of complications. Dressing and pressure applied.  Patient escorted to car by NT in a wheelchair,  no distress noted upon discharge.  [name] 03/21/2016 9:30 PM

## 2016-03-21 NOTE — Progress Notes (Signed)
Kristine Park A. [name], MD     [email or web address]          Kristine Miller is an 55 y.o. female.   Assessment/Plan: Severe relapse of multiple sclerosis in a patient who is not on immunomodulating therapy/diseased modifying therapy. Completed 5 IV steroids  Morbid obesity.  RECOMMENDATION: FU in office 1 month       She has a lot of complaints today - feet feeling cold, legs weaker, and more numbness of legs. Exam about the same. Up walking with walker though.   GENERAL: Pleasant and appears to be in some discomfort but in no acute distress.  HEENT: Supple. Atraumatic normocephalic.   ABDOMEN: soft   EXTREMITIES: No edema   BACK: Normal.  SKIN: Normal by inspection.   MENTAL STATUS: Alert and oriented. Speech, language and cognition are generally intact. Judgment and insight normal.   CRANIAL NERVES: Pupils are equal, round and reactive to light and accommodation; extra ocular movements are full, there is no significant nystagmus; visual fields are full; upper and lower facial muscles are normal in strength and symmetric, there is no flattening of the nasolabial folds; tongue is midline; uvula is midline; shoulder elevation is normal.  MOTOR: She has normal tone bulk and strength of the right upper extremity and the right lower extremity. She has left hemiparesis with the left upper extremity graded as 4 minus/5. Left hip flexion 3 and dorsiflexion 3/5.  COORDINATION: Left finger to nose is normal, right finger to nose is normal, No rest tremor; no intention tremor; no postural tremor; no bradykinesia.   SENSATION: This is reduced to light touch and temperature on the left side.      Objective: Vital signs in last 24 hours: Temp:  [97.4 F (36.3 C)-98.2 F (36.8 C)] 98.1 F (36.7 C) (05/08 1755) Pulse Rate:  [63-74] 74 (05/08 1755) Resp:  [18-20] 18 (05/08 1755) BP: (130-152)/(62-77) 152/69 mmHg (05/08 1755) SpO2:  [94 %-95 %] 95 %  (05/08 1755)  Intake/Output from previous day: 05/07 0701 - 05/08 0700 In: 778 [P.O.:720; IV Piggyback:58] Out: -  Intake/Output this shift:   Nutritional status: Diet Heart Room service appropriate?: Yes; Fluid consistency:: Thin   Lab Results: No results found for this or any previous visit (from the past 48 hour(s)).  Lipid Panel No results for input(s): CHOL, TRIG, HDL, CHOLHDL, VLDL, LDLCALC in the last 72 hours.  Studies/Results:   MRI HEAD FINDINGS  No evidence for acute infarction, hemorrhage, mass lesion, hydrocephalus, or extra-axial fluid. Normal cerebral volume.  Scattered subcortical greater than periventricular white matter signal abnormalities, supratentorial, largely subcentimeter, without periventricular predominance. No brainstem or posterior fossa involvement. No progression of disease the from 2015.  Post infusion, no abnormal enhancement of the brain or meninges.  No midline abnormality. Extracranial soft tissues unremarkable.  MRI CERVICAL SPINE FINDINGS  The spinal cord is visualized down to the mid T2 segment.  No evidence for disc degeneration, disc herniation, vertebral body abnormality or paraspinous mass. No spinal stenosis or foraminal narrowing on axial images. BILATERAL vertebral arteries are patent, LEFT dominant.  T2 weighted imaging through the spinal canal with attention to the cord demonstrates no convincing areas of white matter signal abnormality to suggest spinal multiple sclerosis. There is no cord atrophy or hydromyelia. There is no tonsillar herniation.  No abnormal enhancement of the cord or surrounding structures.  IMPRESSION: Stable (since 2015) nonspecific supratentorial white matter lesions. No periventricular predominance, restricted diffusion, or postcontrast enhancement to strongly  suggest multiple sclerosis, although in the appropriate clinical setting, these lesions could represent a chronic  manifestation of MS.  No evidence for spinal MS involving the cervical and upper thoracic segments. No abnormal postcontrast enhancement is visualized of the cord or surrounding structures. No compressive lesion is observed.    Medications:  Scheduled Meds: . enoxaparin (LOVENOX) injection  40 mg Subcutaneous Q24H  . methylPREDNISolone (SOLU-MEDROL) injection  1,000 mg Intravenous Daily  . sodium chloride flush  3 mL Intravenous Q12H   Continuous Infusions:   PRN Meds:.acetaminophen **OR** acetaminophen, ALPRAZolam, HYDROcodone-acetaminophen, ondansetron **OR** ondansetron (ZOFRAN) IV, tiZANidine     LOS: 4 days   Sintia Mckissic A. Merlene Miller, M.D.  Diplomate, Tax adviser of Psychiatry and Neurology ( Neurology).

## 2017-02-15 ENCOUNTER — Encounter (HOSPITAL_COMMUNITY): Payer: Self-pay | Admitting: *Deleted

## 2017-02-15 ENCOUNTER — Emergency Department (HOSPITAL_COMMUNITY): Payer: Self-pay

## 2017-02-15 ENCOUNTER — Emergency Department (HOSPITAL_COMMUNITY)
Admission: EM | Admit: 2017-02-15 | Discharge: 2017-02-15 | Disposition: A | Discharge status: Home / Self Care | Payer: Self-pay | Attending: Emergency Medicine | Admitting: Emergency Medicine

## 2017-02-15 DIAGNOSIS — Z7982 Long term (current) use of aspirin: Secondary | ICD-10-CM | POA: Insufficient documentation

## 2017-02-15 DIAGNOSIS — Z79899 Other long term (current) drug therapy: Secondary | ICD-10-CM | POA: Insufficient documentation

## 2017-02-15 DIAGNOSIS — J45909 Unspecified asthma, uncomplicated: Secondary | ICD-10-CM | POA: Insufficient documentation

## 2017-02-15 DIAGNOSIS — L0390 Cellulitis, unspecified: Secondary | ICD-10-CM | POA: Insufficient documentation

## 2017-02-15 LAB — CBC
HCT: 44 % (ref 36.0–46.0)
HEMOGLOBIN: 14.8 g/dL (ref 12.0–15.0)
MCH: 29.5 pg (ref 26.0–34.0)
MCHC: 33.6 g/dL (ref 30.0–36.0)
MCV: 87.6 fL (ref 78.0–100.0)
Platelets: 367 10*3/uL (ref 150–400)
RBC: 5.02 MIL/uL (ref 3.87–5.11)
RDW: 13.9 % (ref 11.5–15.5)
WBC: 12.7 10*3/uL — ABNORMAL HIGH (ref 4.0–10.5)

## 2017-02-15 LAB — BASIC METABOLIC PANEL
ANION GAP: 8 (ref 5–15)
BUN: 11 mg/dL (ref 6–20)
CHLORIDE: 106 mmol/L (ref 101–111)
CO2: 27 mmol/L (ref 22–32)
CREATININE: 0.9 mg/dL (ref 0.44–1.00)
Calcium: 9.6 mg/dL (ref 8.9–10.3)
GFR calc non Af Amer: >60 mL/min (ref >60)
Glucose, Bld: 95 mg/dL (ref 65–99)
Potassium: 4 mmol/L (ref 3.5–5.1)
Sodium: 141 mmol/L (ref 135–145)

## 2017-02-15 LAB — TROPONIN I: Troponin I: <0.03 ng/mL (ref <0.03)

## 2017-02-15 MED ORDER — ONDANSETRON HCL 4 MG/2ML IJ SOLN
4.0000 mg | Freq: Once | INTRAMUSCULAR | Status: AC
  Administered 2017-02-15: 4 mg via INTRAVENOUS
  Filled 2017-02-15: qty 2

## 2017-02-15 MED ORDER — CEPHALEXIN 500 MG PO CAPS
500.0000 mg | ORAL_CAPSULE | Freq: Once | ORAL | Status: AC
  Administered 2017-02-15: 500 mg via ORAL
  Filled 2017-02-15: qty 1

## 2017-02-15 MED ORDER — CEPHALEXIN 500 MG PO CAPS: 500.0000 mg | ORAL_CAPSULE | Freq: Four times a day (QID) | ORAL | 0 refills | Status: DC

## 2017-02-15 MED ORDER — SODIUM CHLORIDE 0.9 % IV BOLUS (SEPSIS)
1000.0000 mL | Freq: Once | INTRAVENOUS | Status: AC
Start: 2017-02-15 — End: 2017-02-15
  Administered 2017-02-15: 1000 mL via INTRAVENOUS

## 2017-02-15 NOTE — ED Provider Notes (Signed)
[facility] DEPT Provider Note   CSN: [id number] Arrival date & time: 02/15/17  1608     History   Chief Complaint Chief Complaint  Patient presents with  . Chest Pain    HPI Kristine Miller is a 56 y.o. female.  HPI  New-onset of swelling and pain to her left upper chest. Mild erythema as well. This started yesterday and has progressively worsened. She also has sensation of swelling of her left medial breast as well. No history of the same. Has not checked her temperature but has not felt that fevers or chills. Tried anything for the symptoms. Doesn't feel short of breath. No trauma to the area. No recent illnesses.  Past Medical History:  Diagnosis Date  . Anxiety   . Asthma   . Chronic headaches   . IBS (irritable bowel syndrome)   . MS (multiple sclerosis) ([place])   . Palpitations   . Pneumonia   . Rotator cuff arthropathy, left     Patient Active Problem List   Diagnosis Date Noted  . Numbness 03/16/2016  . Left-sided weakness 03/16/2016  . MS (multiple sclerosis) ([place]) 04/28/2014  . Multiple sclerosis exacerbation ([place]) 04/28/2014  . Difficulty in walking(719.7) 08/23/2013  . 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[place]) 08/23/2013  . Abnormal brain MRI 08/20/2013  . Anxiety state, unspecified 08/19/2013  . 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  . 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 Leukocytosis, unspecified 08/19/2013  . Chest pain 04/06/2012  . Palpitations 04/06/2012  . Obesity 04/06/2012  . Hypokalemia 04/06/2012    Past Surgical History:  Procedure Laterality Date  . ABDOMINAL HYSTERECTOMY    . APPENDECTOMY    . CESAREAN SECTION    . CHOLECYSTECTOMY    . ROTATOR CUFF REPAIR      OB History    Gravida Para Term Preterm AB Living   5 2 2   3 2    SAB TAB Ectopic Multiple Live Births   3               Home Medications    Prior to Admission medications   Medication Sig Start Date End Date Taking? Authorizing Provider  acetaminophen (TYLENOL) 500 MG  tablet Take 1,000 mg by mouth every 6 (six) hours as needed for headache.   Yes Historical Provider, MD  aspirin 81 MG chewable tablet Chew 324 mg by mouth daily as needed for moderate pain.   Yes Historical Provider, MD  aspirin-acetaminophen-caffeine (EXCEDRIN MIGRAINE) [phone] MG tablet Take 2 tablets by mouth every 6 (six) hours as needed for headache.   Yes Historical Provider, MD  cephALEXin (KEFLEX) 500 MG capsule Take 1 capsule (500 mg total) by mouth 4 (four) times daily. 02/15/17   Merrily Pew, MD  OVER THE COUNTER MEDICATION Take 1 capsule by mouth daily. Essential oil "DDR" for MS.    Historical Provider, MD    Family History Family History  Problem Relation Age of Onset  . Cancer Mother   . Diabetes Mother   . Diabetes type I Daughter     Social History Social History  Substance Use Topics  . Smoking status: Never Smoker  . Smokeless tobacco: Never Used  . Alcohol use No     Allergies   Morphine and related and Penicillins   Review of Systems Review of Systems  All other systems reviewed and are negative.    Physical Exam Updated Vital Signs BP 125/62   Pulse 90   Temp 99.1 F (37.3 C) (Temporal)  Resp (!) 27   Ht 5' (1.524 m)   Wt 230 lb (104.3 kg)   SpO2 98%   BMI 44.92 kg/m   Physical Exam  Constitutional: She is oriented to person, place, and time. She appears well-developed and well-nourished.  HENT:  Head: Normocephalic and atraumatic.  Eyes: Conjunctivae and EOM are normal. Pupils are equal, round, and reactive to light.  Neck: Normal range of motion.  Cardiovascular: Normal rate and regular rhythm.   Pulmonary/Chest: Effort normal. No stridor. No respiratory distress.  Abdominal: Soft. She exhibits no distension. There is no tenderness.  Musculoskeletal: She exhibits no edema or deformity.  Neurological: She is alert and oriented to person, place, and time. No cranial nerve deficit. Coordination normal.  Skin: Skin is warm and dry.  There is erythema (and mild edema overlying left clavicle, no induration, fluctuance but is slightly tender).  Nursing note and vitals reviewed.    ED Treatments / Results  Labs (all labs ordered are listed, but only abnormal results are displayed) Labs Reviewed  CBC - Abnormal; Notable for the following:       Result Value   WBC 12.7 (*)    All other components within normal limits  BASIC METABOLIC PANEL  TROPONIN I    EKG  EKG Interpretation  Date/Time:  Wednesday February 15 2017 16:18:25 EDT Ventricular Rate:  100 PR Interval:  150 QRS Duration: 86 QT Interval:  332 QTC Calculation: 428 R Axis:   -86 Text Interpretation:  Normal sinus rhythm Left axis deviation Pulmonary disease pattern Nonspecific ST abnormality Abnormal ECG Confirmed by [facility] MD, Corene Cornea [phone]) on 02/15/2017 5:18:16 PM       Radiology Dg Chest 2 View  Result Date: 02/15/2017 CLINICAL DATA:  Left-sided chest pain EXAM: CHEST  2 VIEW COMPARISON:  CT chest of 03/17/2016 and chest x-ray of 03/16/2016 FINDINGS: No active infiltrate or effusion is seen. Mediastinal and hilar contours are unremarkable. The heart is within upper limits normal. No acute bony abnormality is seen. There are degenerative changes in the mid to lower thoracic spine. IMPRESSION: No active cardiopulmonary disease. Electronically Signed   By: Ivar Drape M.D.   On: 02/15/2017 16:36    Procedures Procedures (including critical care time)  Medications Ordered in ED Medications  cephALEXin (KEFLEX) capsule 500 mg (500 mg Oral Given 02/15/17 2010)  ondansetron (ZOFRAN) injection 4 mg (4 mg Intravenous Given 02/15/17 2010)  sodium chloride 0.9 % bolus 1,000 mL (0 mLs Intravenous Stopped 02/15/17 2111)     Initial Impression / Assessment and Plan / ED Course  I have reviewed the triage vital signs and the nursing notes.  Pertinent labs & imaging results that were available during my care of the patient were reviewed by me and considered in my  medical decision making (see chart for details).     Elevated wbc, slightly tachy on exam. Possible early cellulitis? Doubt ACS. Doubt PE. Doubt PTX (not crepitus).has had some nausea, decreased PO so will give fluids and anti-emetics.   Treated for cellulitis. Will fu w/ pcp.  Final Clinical Impressions(s) / ED Diagnoses   Final diagnoses:  Cellulitis, unspecified cellulitis site    New Prescriptions Discharge Medication List as of 02/15/2017  9:04 PM    START taking these medications   Details  cephALEXin (KEFLEX) 500 MG capsule Take 1 capsule (500 mg total) by mouth 4 (four) times daily., Starting Wed 02/15/2017, Print         Merrily Pew, MD 02/15/17 2212

## 2017-02-15 NOTE — ED Triage Notes (Signed)
Pt comes in for left sided chest pain that started last night. She has left chest swelling as well. This is located on her upper left chest. Denies any airway problems.

## 2017-02-15 NOTE — ED Notes (Signed)
ED Provider at bedside. 

## 2017-02-15 NOTE — Discharge Instructions (Signed)
If not improving, need to follow up with primary doctor to get further breast imaging (ultrasound or mammogram).

## 2017-02-24 ENCOUNTER — Ambulatory Visit (INDEPENDENT_AMBULATORY_CARE_PROVIDER_SITE_OTHER): Payer: Self-pay | Admitting: Adult Health

## 2017-02-24 ENCOUNTER — Encounter: Payer: Self-pay | Admitting: Adult Health

## 2017-02-24 ENCOUNTER — Other Ambulatory Visit: Payer: Self-pay | Admitting: Adult Health

## 2017-02-24 VITALS — BP 140/90 | HR 94 | Ht 60.0 in | Wt 234.0 lb

## 2017-02-24 DIAGNOSIS — Z1231 Encounter for screening mammogram for malignant neoplasm of breast: Secondary | ICD-10-CM

## 2017-02-24 DIAGNOSIS — D229 Melanocytic nevi, unspecified: Secondary | ICD-10-CM | POA: Insufficient documentation

## 2017-02-24 DIAGNOSIS — L819 Disorder of pigmentation, unspecified: Secondary | ICD-10-CM

## 2017-02-24 DIAGNOSIS — L03313 Cellulitis of chest wall: Secondary | ICD-10-CM | POA: Insufficient documentation

## 2017-02-24 NOTE — Progress Notes (Signed)
Subjective:     Patient ID: Kristine Miller, female   DOB: 1960/12/04, 56 y.o.   MRN: [id number]  HPI Kristine Miller is a 56 year old white female in for ER F/U, was seen 02/15/17 at [facility] ER and treated for cellulitis left chest wall, and it is better. She says she wants mole checked on left breast, is getting bigger, and needs mammogram.She is self pay.  PCP is Dr. Wolfgang Phoenix, but has not seen him recently, was diagnosed with MS in 2014.   Review of Systems  No pain left chest wall Mole left breast getting bigger Reviewed past medical,surgical, social and family history. Reviewed medications and allergies.     Objective:   Physical Exam BP 140/90 (BP Location: Left Arm, Patient Position: Sitting, Cuff Size: Large)   Pulse 94   Ht 5' (1.524 m)   Wt 234 lb (106.1 kg)   BMI 45.70 kg/m No redness on tenderness left chest wall.    Skin warm and dry,  Breasts:no dominate palpable mass, retraction or nipple discharge, has ?fat pad near left under arm and has 2 x 1.5 cm irregular shaped mole left breast at 3 o'clock.  Will get screening mammogram set up for her and advised to see dermatologist about mole.Let mammo tech know about mole, so they can mark it for films.  PHQ 2 score 0. Assessment:    Cellulitis left chest, resolved    Change in mole    Plan:     Screening mammogram 4/25 at 10:15 at [facility] See dermatologist about mole left breast  Follow up prn

## 2017-03-08 ENCOUNTER — Ambulatory Visit (HOSPITAL_COMMUNITY)
Admission: RE | Admit: 2017-03-08 | Discharge: 2017-03-08 | Disposition: A | Discharge status: Home / Self Care | Payer: Self-pay | Source: Ambulatory Visit | Attending: Adult Health | Admitting: Adult Health

## 2017-03-08 DIAGNOSIS — Z1231 Encounter for screening mammogram for malignant neoplasm of breast: Secondary | ICD-10-CM | POA: Insufficient documentation

## 2018-03-29 ENCOUNTER — Emergency Department (HOSPITAL_COMMUNITY): Payer: Self-pay

## 2018-03-29 ENCOUNTER — Other Ambulatory Visit: Payer: Self-pay

## 2018-03-29 ENCOUNTER — Emergency Department (HOSPITAL_COMMUNITY)
Admission: EM | Admit: 2018-03-29 | Discharge: 2018-03-29 | Disposition: A | Discharge status: Home / Self Care | Payer: Self-pay | Attending: Emergency Medicine | Admitting: Emergency Medicine

## 2018-03-29 ENCOUNTER — Encounter (HOSPITAL_COMMUNITY): Payer: Self-pay | Admitting: Emergency Medicine

## 2018-03-29 DIAGNOSIS — Z79899 Other long term (current) drug therapy: Secondary | ICD-10-CM | POA: Insufficient documentation

## 2018-03-29 DIAGNOSIS — J45909 Unspecified asthma, uncomplicated: Secondary | ICD-10-CM | POA: Insufficient documentation

## 2018-03-29 DIAGNOSIS — M5432 Sciatica, left side: Secondary | ICD-10-CM | POA: Insufficient documentation

## 2018-03-29 DIAGNOSIS — R52 Pain, unspecified: Secondary | ICD-10-CM

## 2018-03-29 MED ORDER — PREDNISONE 20 MG PO TABS: 20.0000 mg | ORAL_TABLET | Freq: Two times a day (BID) | ORAL | 0 refills | Status: DC

## 2018-03-29 MED ORDER — PREDNISONE 50 MG PO TABS
60.0000 mg | ORAL_TABLET | Freq: Once | ORAL | Status: AC
  Administered 2018-03-29: 60 mg via ORAL
  Filled 2018-03-29: qty 1

## 2018-03-29 NOTE — ED Triage Notes (Signed)
Pt states she began having L leg weakness and pain a week ago. States she has MS and this has happened in the past but it does not feel like a normal MS flare.

## 2018-03-29 NOTE — ED Provider Notes (Signed)
[facility] EMERGENCY DEPARTMENT Provider Note   CSN: [id number] Arrival date & time: 03/29/18  1237     History   Chief Complaint Chief Complaint  Patient presents with  . Leg Pain    HPI Kristine Miller is a 57 y.o. female.  HPI   She presents for evaluation of left hip pain which radiates to her left foot, which started 1 week ago and has been persistent.  She is able ambulate using her cane which she usually does because of her MS.  She denies fever, chills, fall, lifting trauma, loss of bowel or bladder function.  No prior similar problems.  No chronic back pain or prior episodes of sciatica.  There are no other known modifying factors.    Past Medical History:  Diagnosis Date  . Anxiety   . Asthma   . Chronic headaches   . IBS (irritable bowel syndrome)   . MS (multiple sclerosis) ([place])   . Palpitations   . Pneumonia   . Rotator cuff arthropathy, left     Patient Active Problem List   Diagnosis Date Noted  . Change in mole 02/24/2017  . Cellulitis of chest wall 02/24/2017  . Numbness 03/16/2016  . Left-sided weakness 03/16/2016  . MS (multiple sclerosis) ([place]) 04/28/2014  . Multiple sclerosis exacerbation ([place]) 04/28/2014  . Difficulty in walking(719.7) 08/23/2013  . 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[place]) 08/23/2013  . Abnormal brain MRI 08/20/2013  . Anxiety state, unspecified 08/19/2013  . 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  . 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 Leukocytosis, unspecified 08/19/2013  . Chest pain 04/06/2012  . Palpitations 04/06/2012  . Obesity 04/06/2012  . Hypokalemia 04/06/2012    Past Surgical History:  Procedure Laterality Date  . ABDOMINAL HYSTERECTOMY    . APPENDECTOMY    . CESAREAN SECTION    . CHOLECYSTECTOMY    . ROTATOR CUFF REPAIR       OB History    Gravida  5   Para  2   Term  2   Preterm      AB  3   Living  2     SAB  3   TAB      Ectopic      Multiple      Live Births  2             Home Medications    Prior to Admission medications   Medication Sig Start Date End Date Taking? Authorizing Provider  acetaminophen (TYLENOL) 500 MG tablet Take 1,000 mg by mouth every 6 (six) hours as needed for headache.    [provider]  aspirin 81 MG chewable tablet Chew 324 mg by mouth daily as needed for moderate pain.    [provider]  aspirin-acetaminophen-caffeine (EXCEDRIN MIGRAINE) [phone] MG tablet Take 2 tablets by mouth every 6 (six) hours as needed for headache.    [provider]  cephALEXin (KEFLEX) 500 MG capsule Take 1 capsule (500 mg total) by mouth 4 (four) times daily. 02/15/17   Mesner, Corene Cornea, MD  OVER THE COUNTER MEDICATION Take 1 capsule by mouth daily. Essential oil "DDR" for MS.    [provider]  predniSONE (DELTASONE) 20 MG tablet Take 1 tablet (20 mg total) by mouth 2 (two) times daily. 03/29/18   Daleen Bo, MD    Family History Family History  Problem Relation Age of Onset  . Cancer Mother   . Diabetes Mother   . Diabetes type I Daughter   .  Other Daughter        stomach issue  . Cancer Maternal Grandmother   . Cancer Maternal Grandfather   . Congestive Heart Failure Maternal Grandfather   . Other Sister        blood clot in leg  . Seizures Son     Social History Social History   Tobacco Use  . Smoking status: Never Smoker  . Smokeless tobacco: Never Used  Substance Use Topics  . Alcohol use: No  . Drug use: No     Allergies   Morphine and related and Penicillins   Review of Systems Review of Systems  All other systems reviewed and are negative.    Physical Exam Updated Vital Signs BP (!) 143/99 (BP Location: Right Arm)   Pulse 85   Temp 98.7 F (37.1 C) (Oral)   Resp 19   Ht 5\' 1"  (1.549 m)   Wt 104.3 kg (230 lb)   SpO2 97%   BMI 43.46 kg/m   Physical Exam  Constitutional: She is oriented to person, place, and time. She appears well-developed and well-nourished.   HENT:  Head: Normocephalic and atraumatic.  Right Ear: External ear normal.  Left Ear: External ear normal.  Eyes: Pupils are equal, round, and reactive to light. Conjunctivae and EOM are normal.  Neck: Normal range of motion and phonation normal. Neck supple.  Cardiovascular: Normal rate.  Pulmonary/Chest: Effort normal. She exhibits no bony tenderness.  Musculoskeletal: Normal range of motion.  Mild tenderness left hip, increased with passive range of motion.  Pain limited strength left hip, when attempting to raise leg off the bed.  Neurological: She is alert and oriented to person, place, and time. No cranial nerve deficit or sensory deficit. She exhibits normal muscle tone. Coordination normal.  Skin: Skin is warm, dry and intact.  Psychiatric: She has a normal mood and affect. Her behavior is normal. Judgment and thought content normal.  Nursing note and vitals reviewed.    ED Treatments / Results  Labs (all labs ordered are listed, but only abnormal results are displayed) Labs Reviewed - No data to display  EKG None  Radiology Dg Femur Min 2 Views Left  Result Date: 03/29/2018 CLINICAL DATA:  Pain for 1 week EXAM: LEFT FEMUR 2 VIEWS COMPARISON:  None. FINDINGS: Frontal and lateral views obtained. No fracture or dislocation. No abnormal periosteal reaction. Joint spaces appear unremarkable. No erosive change. IMPRESSION: No fracture or dislocation. No appreciable arthropathy. No abnormal periosteal reaction. Electronically Signed   By: Lowella Grip III M.D.   On: 03/29/2018 13:17    Procedures Procedures (including critical care time)  Medications Ordered in ED Medications  predniSONE (DELTASONE) tablet 60 mg (has no administration in time range)     Initial Impression / Assessment and Plan / ED Course  I have reviewed the triage vital signs and the nursing notes.  Pertinent labs & imaging results that were available during my care of the patient were reviewed  by me and considered in my medical decision making (see chart for details).      Patient Vitals for the past 24 hrs:  BP Temp Temp src Pulse Resp SpO2 Height Weight  03/29/18 1650 (!) 143/99 98.7 F (37.1 C) Oral 85 - 97 % - -  03/29/18 1250 - - - - - - 5\' 1"  (1.549 m) 104.3 kg (230 lb)  03/29/18 1249 (!) 154/79 98.6 F (37 C) Oral 88 19 96 % - -    5:45  PM Reevaluation with update and discussion. After initial assessment and treatment, an updated evaluation reveals no change in clinical status.  Findings discussed with patient, and a person with her, all questions were answered. Daleen Bo   Medical Decision Making: Clinical signs and symptoms of sciatica without evidence for lumbar myelopathy.  Low risk chance for MS being source of this isolated left leg pain and weakness.  Nursing Notes Reviewed/ Care Coordinated Applicable Imaging Reviewed Interpretation of Laboratory Data incorporated into ED treatment  The patient appears reasonably screened and/or stabilized for discharge and I doubt any other medical condition or other [facility] requiring further screening, evaluation, or treatment in the ED at this time prior to discharge.  Plan: Home Medications-continue usual medications; Home Treatments-rest, heat to affected area; return here if the recommended treatment, does not improve the symptoms; Recommended follow up-neurology follow-up if not improving, PCP checkup next week and as needed.   CRITICAL CARE-no Performed by: Daleen Bo    Final Clinical Impressions(s) / ED Diagnoses   Final diagnoses:  Sciatica of left side    ED Discharge Orders        Ordered    predniSONE (DELTASONE) 20 MG tablet  2 times daily     03/29/18 1747       Daleen Bo, MD 03/29/18 1749

## 2018-03-29 NOTE — Discharge Instructions (Signed)
For the discomfort in your left leg, we are prescribing prednisone to treat inflammation.  The discomfort is likely caused by sciatic nerve irritation.  If this does not help your discomfort, see your neurologist next week.  To help the pain, use heat on the sore area of your hip 3 or 4 times a day for 30 minutes.  Return here, if needed, for problems.

## 2018-04-13 ENCOUNTER — Other Ambulatory Visit: Payer: Self-pay

## 2018-04-13 ENCOUNTER — Emergency Department (HOSPITAL_COMMUNITY)
Admission: EM | Admit: 2018-04-13 | Discharge: 2018-04-13 | Disposition: A | Discharge status: Home / Self Care | Payer: Self-pay | Attending: Emergency Medicine | Admitting: Emergency Medicine

## 2018-04-13 ENCOUNTER — Encounter (HOSPITAL_COMMUNITY): Payer: Self-pay

## 2018-04-13 ENCOUNTER — Emergency Department (HOSPITAL_COMMUNITY): Payer: Self-pay

## 2018-04-13 DIAGNOSIS — N200 Calculus of kidney: Secondary | ICD-10-CM | POA: Insufficient documentation

## 2018-04-13 DIAGNOSIS — J45909 Unspecified asthma, uncomplicated: Secondary | ICD-10-CM | POA: Insufficient documentation

## 2018-04-13 DIAGNOSIS — Z79899 Other long term (current) drug therapy: Secondary | ICD-10-CM | POA: Insufficient documentation

## 2018-04-13 LAB — URINALYSIS, ROUTINE W REFLEX MICROSCOPIC
BILIRUBIN URINE: NEGATIVE
Glucose, UA: NEGATIVE mg/dL
Ketones, ur: NEGATIVE mg/dL
Leukocytes, UA: NEGATIVE
NITRITE: NEGATIVE
PROTEIN: NEGATIVE mg/dL
RBC / HPF: >50 RBC/hpf — ABNORMAL HIGH (ref 0–5)
Specific Gravity, Urine: 1.012 (ref 1.005–1.030)
pH: 5 (ref 5.0–8.0)

## 2018-04-13 LAB — CBC WITH DIFFERENTIAL/PLATELET
Basophils Absolute: 0 10*3/uL (ref 0.0–0.1)
Basophils Relative: 0 %
Eosinophils Absolute: 0.2 10*3/uL (ref 0.0–0.7)
Eosinophils Relative: 1 %
HCT: 45.1 % (ref 36.0–46.0)
HEMOGLOBIN: 14.9 g/dL (ref 12.0–15.0)
LYMPHS ABS: 3.1 10*3/uL (ref 0.7–4.0)
LYMPHS PCT: 27 %
MCH: 29 pg (ref 26.0–34.0)
MCHC: 33 g/dL (ref 30.0–36.0)
MCV: 87.9 fL (ref 78.0–100.0)
MONOS PCT: 7 %
Monocytes Absolute: 0.8 10*3/uL (ref 0.1–1.0)
NEUTROS PCT: 65 %
Neutro Abs: 7.5 10*3/uL (ref 1.7–7.7)
Platelets: 385 10*3/uL (ref 150–400)
RBC: 5.13 MIL/uL — AB (ref 3.87–5.11)
RDW: 13.7 % (ref 11.5–15.5)
WBC: 11.6 10*3/uL — AB (ref 4.0–10.5)

## 2018-04-13 LAB — BASIC METABOLIC PANEL
Anion gap: 9 (ref 5–15)
BUN: 8 mg/dL (ref 6–20)
CHLORIDE: 106 mmol/L (ref 101–111)
CO2: 24 mmol/L (ref 22–32)
Calcium: 9.3 mg/dL (ref 8.9–10.3)
Creatinine, Ser: 0.89 mg/dL (ref 0.44–1.00)
GFR calc Af Amer: >60 mL/min (ref >60)
GFR calc non Af Amer: >60 mL/min (ref >60)
GLUCOSE: 123 mg/dL — AB (ref 65–99)
POTASSIUM: 3.7 mmol/L (ref 3.5–5.1)
Sodium: 139 mmol/L (ref 135–145)

## 2018-04-13 MED ORDER — HYDROMORPHONE HCL 1 MG/ML IJ SOLN
0.5000 mg | Freq: Once | INTRAMUSCULAR | Status: AC
  Administered 2018-04-13: 0.5 mg via INTRAVENOUS
  Filled 2018-04-13: qty 1

## 2018-04-13 MED ORDER — SODIUM CHLORIDE 0.9 % IV BOLUS
1000.0000 mL | Freq: Once | INTRAVENOUS | Status: AC
  Administered 2018-04-13: 1000 mL via INTRAVENOUS

## 2018-04-13 MED ORDER — ONDANSETRON 4 MG PO TBDP: 4.0000 mg | ORAL_TABLET | Freq: Three times a day (TID) | ORAL | 0 refills | Status: DC | PRN

## 2018-04-13 MED ORDER — OXYCODONE-ACETAMINOPHEN 5-325 MG PO TABS: 1.0000 | ORAL_TABLET | Freq: Four times a day (QID) | ORAL | 0 refills | Status: DC | PRN

## 2018-04-13 MED ORDER — TAMSULOSIN HCL 0.4 MG PO CAPS: 0.4000 mg | ORAL_CAPSULE | Freq: Every day | ORAL | 0 refills | Status: DC

## 2018-04-13 MED ORDER — KETOROLAC TROMETHAMINE 30 MG/ML IJ SOLN
30.0000 mg | Freq: Once | INTRAMUSCULAR | Status: AC
  Administered 2018-04-13: 30 mg via INTRAVENOUS
  Filled 2018-04-13: qty 1

## 2018-04-13 NOTE — ED Triage Notes (Signed)
Pt states she awoke this am with right flank pain, denies urinary symptoms.

## 2018-04-13 NOTE — Discharge Instructions (Addendum)
You were seen today for right flank pain.  You have evidence of a 2 mm kidney stone in the right ureter.  This will likely pass on its own.  Take medications as prescribed.  Follow-up with urology if you have any persistent symptoms.  If you develop fever, burning with urination, or any new or worsening symptoms you should be reevaluated.

## 2018-04-13 NOTE — ED Provider Notes (Signed)
[facility] EMERGENCY DEPARTMENT Provider Note   CSN: [id number] Arrival date & time: 04/13/18  0605     History   Chief Complaint Chief Complaint  Patient presents with  . Flank Pain    HPI Kristine Miller is a 57 y.o. female.  HPI  This is a 57 year old female with a history of IBS, MS, asthma who presents with right flank pain.  Patient reports acute onset of right flank pain that woke her up at 355 this morning.  She reports that the pain is sharp and radiates into her right abdomen.  She rates her pain at 10 out of 10.  She has not taken anything for the pain.  Nothing seems to make the pain better or worse.  She does report nausea.  No vomiting or diarrhea.  She denies any fevers.  Patient has never had pain like this before.  No history of kidney stones.  Past Medical History:  Diagnosis Date  . Anxiety   . Asthma   . Chronic headaches   . IBS (irritable bowel syndrome)   . MS (multiple sclerosis) ([place])   . Palpitations   . Pneumonia   . Rotator cuff arthropathy, left     Patient Active Problem List   Diagnosis Date Noted  . Change in mole 02/24/2017  . Cellulitis of chest wall 02/24/2017  . Numbness 03/16/2016  . Left-sided weakness 03/16/2016  . MS (multiple sclerosis) ([place]) 04/28/2014  . Multiple sclerosis exacerbation ([place]) 04/28/2014  . Difficulty in walking(719.7) 08/23/2013  . 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[place]) 08/23/2013  . Abnormal brain MRI 08/20/2013  . Anxiety state, unspecified 08/19/2013  . 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  . 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 Leukocytosis, unspecified 08/19/2013  . Chest pain 04/06/2012  . Palpitations 04/06/2012  . Obesity 04/06/2012  . Hypokalemia 04/06/2012    Past Surgical History:  Procedure Laterality Date  . ABDOMINAL HYSTERECTOMY    . APPENDECTOMY    . CESAREAN SECTION    . CHOLECYSTECTOMY    . ROTATOR CUFF REPAIR       OB History    Gravida  5   Para  2   Term  2   Preterm     AB  3   Living  2     SAB  3   TAB      Ectopic      Multiple      Live Births  2            Home Medications    Prior to Admission medications   Medication Sig Start Date End Date Taking? Authorizing Provider  acetaminophen (TYLENOL) 500 MG tablet Take 1,000 mg by mouth every 6 (six) hours as needed for headache.    [provider]  aspirin 81 MG chewable tablet Chew 324 mg by mouth daily as needed for moderate pain.    [provider]  aspirin-acetaminophen-caffeine (EXCEDRIN MIGRAINE) [id number] MG tablet Take 2 tablets by mouth every 6 (six) hours as needed for headache.    [provider]  cephALEXin (KEFLEX) 500 MG capsule Take 1 capsule (500 mg total) by mouth 4 (four) times daily. 02/15/17   Mesner, Corene Cornea, MD  ondansetron (ZOFRAN ODT) 4 MG disintegrating tablet Take 1 tablet (4 mg total) by mouth every 8 (eight) hours as needed for nausea or vomiting. 04/13/18   Hudsyn Barich, Barbette Hair, MD  OVER THE COUNTER MEDICATION Take 1 capsule by mouth daily. Essential oil "DDR" for MS.  [provider]  oxyCODONE-acetaminophen (PERCOCET/ROXICET) 5-325 MG tablet Take 1 tablet by mouth every 6 (six) hours as needed for severe pain. 04/13/18   Genette Huertas, Barbette Hair, MD  predniSONE (DELTASONE) 20 MG tablet Take 1 tablet (20 mg total) by mouth 2 (two) times daily. 03/29/18   Daleen Bo, MD  tamsulosin (FLOMAX) 0.4 MG CAPS capsule Take 1 capsule (0.4 mg total) by mouth daily. 04/13/18   Maud Rubendall, Barbette Hair, MD    Family History Family History  Problem Relation Age of Onset  . Cancer Mother   . Diabetes Mother   . Diabetes type I Daughter   . Other Daughter        stomach issue  . Cancer Maternal Grandmother   . Cancer Maternal Grandfather   . Congestive Heart Failure Maternal Grandfather   . Other Sister        blood clot in leg  . Seizures Son     Social History Social History   Tobacco Use  . Smoking status: Never Smoker  . Smokeless  tobacco: Never Used  Substance Use Topics  . Alcohol use: No  . Drug use: No     Allergies   Morphine and related; Penicillins; and Prednisone   Review of Systems Review of Systems  Constitutional: Negative for fever.  Respiratory: Negative for shortness of breath.   Cardiovascular: Negative for chest pain.  Gastrointestinal: Positive for nausea. Negative for abdominal pain, diarrhea and vomiting.  Genitourinary: Positive for flank pain. Negative for dysuria and hematuria.  All other systems reviewed and are negative.    Physical Exam Updated Vital Signs BP 140/70   Pulse 74   Temp 98.5 F (36.9 C) (Oral)   Resp 16   SpO2 96%   Physical Exam  Constitutional: She is oriented to person, place, and time. She appears well-developed and well-nourished.  Uncomfortable appearing but nontoxic, obese  HENT:  Head: Normocephalic and atraumatic.  Cardiovascular: Normal rate, regular rhythm and normal heart sounds.  No murmur heard. Pulmonary/Chest: Effort normal and breath sounds normal. No respiratory distress. She has no wheezes.  Abdominal: Soft. Bowel sounds are normal. She exhibits no mass. There is no tenderness. There is no guarding.  Genitourinary:  Genitourinary Comments: No CVA tenderness  Neurological: She is alert and oriented to person, place, and time.  Skin: Skin is warm and dry.  Psychiatric: She has a normal mood and affect.  Nursing note and vitals reviewed.    ED Treatments / Results  Labs (all labs ordered are listed, but only abnormal results are displayed) Labs Reviewed  URINALYSIS, ROUTINE W REFLEX MICROSCOPIC - Abnormal; Notable for the following components:      Result Value   APPearance HAZY (*)    Hgb urine dipstick LARGE (*)    RBC / HPF >50 (*)    Bacteria, UA RARE (*)    All other components within normal limits  CBC WITH DIFFERENTIAL/PLATELET - Abnormal; Notable for the following components:   WBC 11.6 (*)    RBC 5.13 (*)    All other  components within normal limits  BASIC METABOLIC PANEL - Abnormal; Notable for the following components:   Glucose, Bld 123 (*)    All other components within normal limits    EKG None  Radiology Ct Renal Stone Study  Result Date: 04/13/2018 CLINICAL DATA:  Flank pain EXAM: CT ABDOMEN AND PELVIS WITHOUT CONTRAST TECHNIQUE: Multidetector CT imaging of the abdomen and pelvis was performed following the standard protocol without  oral or IV contrast. COMPARISON:  July 26, 2005 FINDINGS: Lower chest: 6 there is mild bibasilar atelectasis. No edema or consolidation. Hepatobiliary: No focal liver lesions are evident on this noncontrast enhanced study. Gallbladder is absent. There is no biliary duct dilatation. Pancreas: No pancreatic mass or inflammatory focus. Spleen: No splenic lesions are evident. Adrenals/Urinary Tract: Adrenals bilaterally appear normal. There is no mass on either side. There is moderate hydronephrosis on the right. There is no appreciable hydronephrosis on the left. There are no intrarenal calculi. There is a 2 mm calculus just beyond the right ureteropelvic junction. No other ureteral calculi are evident. Urinary bladder is virtually empty. The wall thickness of the bladder is felt to be within normal limits. Stomach/Bowel: There is no appreciable bowel wall or mesenteric thickening. There is no evident bowel obstruction. No free air or portal venous air. There is lipomatous infiltration of the ileocecal valve. Vascular/Lymphatic: There is mild atherosclerotic calcification in the aorta and right common iliac artery. No aneurysm evident. Major mesenteric vessels appear patent on this noncontrast enhanced study. There is no evident adenopathy in the abdomen or pelvis. Reproductive: Uterus is absent.  There is no evident pelvic mass. Other: Appendix absent. No ascites or abscess evident in the abdomen or pelvis. There is a small ventral hernia containing only fat. Musculoskeletal:  There is degenerative change in the lower lumbar spine with vacuum phenomenon at L5-S1. There are no blastic or lytic bone lesions. There is no intramuscular or abdominal wall lesion evident. IMPRESSION: 1. 2 mm calculus proximal right ureter just beyond the right ureteropelvic junction with moderate hydronephrosis on the right. 2.  No bowel obstruction.  No abscess in the abdomen or pelvis. 3. Gallbladder absent. Uterus and appendix absent. No periappendiceal region inflammation. 4.  Small ventral hernia containing only fat. 5.  Aortoiliac atherosclerosis. Aortic Atherosclerosis (ICD10-I70.0). Electronically Signed   By: Lowella Grip III M.D.   On: 04/13/2018 07:26    Procedures Procedures (including critical care time)  Medications Ordered in ED Medications  HYDROmorphone (DILAUDID) injection 0.5 mg (0.5 mg Intravenous Given 04/13/18 0639)  sodium chloride 0.9 % bolus 1,000 mL (0 mLs Intravenous Stopped 04/13/18 0718)  ketorolac (TORADOL) 30 MG/ML injection 30 mg (30 mg Intravenous Given 04/13/18 0093)     Initial Impression / Assessment and Plan / ED Course  I have reviewed the triage vital signs and the nursing notes.  Pertinent labs & imaging results that were available during my care of the patient were reviewed by me and considered in my medical decision making (see chart for details).     Patient presents with acute onset of right flank pain.  She is overall nontoxic-appearing on exam and vital signs are reassuring.  History is highly suggestive of kidney stones.  Additional considerations include UTI.  Less likely gallbladder pathology is patient is status post cholecystectomy.  Patient was given pain and nausea medication.  She has hematuria on urinalysis.  Kidney function is preserved.  Patient was given IV Dilaudid and Toradol for pain.  CT scan obtained and shows a 2 mm proximal obstructing stone.  This was reviewed by myself.  I discussed this with patient.  Patient's pain  improved to 2 out of 10 after pain management.  She is able to tolerate fluids.  Discussed with her expectant management and urology follow-up.  After history, exam, and medical workup I feel the patient has been appropriately medically screened and is safe for discharge home. Pertinent diagnoses were discussed  with the patient. Patient was given return precautions.   Final Clinical Impressions(s) / ED Diagnoses   Final diagnoses:  Kidney stone    ED Discharge Orders        Ordered    oxyCODONE-acetaminophen (PERCOCET/ROXICET) 5-325 MG tablet  Every 6 hours PRN     04/13/18 0736    ondansetron (ZOFRAN ODT) 4 MG disintegrating tablet  Every 8 hours PRN     04/13/18 0736    tamsulosin (FLOMAX) 0.4 MG CAPS capsule  Daily     04/13/18 0736       Merryl Hacker, MD 04/13/18 [phone]

## 2019-01-10 ENCOUNTER — Ambulatory Visit: Payer: Self-pay | Admitting: Family Medicine

## 2019-06-09 ENCOUNTER — Emergency Department (HOSPITAL_COMMUNITY): Payer: Self-pay

## 2019-06-09 ENCOUNTER — Emergency Department (HOSPITAL_COMMUNITY)
Admission: EM | Admit: 2019-06-09 | Discharge: 2019-06-09 | Disposition: A | Discharge status: Home / Self Care | Payer: Self-pay | Attending: Emergency Medicine | Admitting: Emergency Medicine

## 2019-06-09 ENCOUNTER — Encounter (HOSPITAL_COMMUNITY): Payer: Self-pay | Admitting: Emergency Medicine

## 2019-06-09 ENCOUNTER — Other Ambulatory Visit: Payer: Self-pay

## 2019-06-09 DIAGNOSIS — R079 Chest pain, unspecified: Secondary | ICD-10-CM | POA: Insufficient documentation

## 2019-06-09 DIAGNOSIS — Z79899 Other long term (current) drug therapy: Secondary | ICD-10-CM | POA: Insufficient documentation

## 2019-06-09 DIAGNOSIS — J45909 Unspecified asthma, uncomplicated: Secondary | ICD-10-CM | POA: Insufficient documentation

## 2019-06-09 DIAGNOSIS — R002 Palpitations: Secondary | ICD-10-CM | POA: Insufficient documentation

## 2019-06-09 LAB — D-DIMER, QUANTITATIVE (NOT AT ARMC): D-Dimer, Quant: 0.78 ug/mL-FEU — ABNORMAL HIGH (ref 0.00–0.50)

## 2019-06-09 LAB — TROPONIN I (HIGH SENSITIVITY)
Troponin I (High Sensitivity): 2 ng/L (ref <18)
Troponin I (High Sensitivity): 3 ng/L (ref <18)

## 2019-06-09 LAB — CBC
HCT: 47.1 % — ABNORMAL HIGH (ref 36.0–46.0)
Hemoglobin: 15.1 g/dL — ABNORMAL HIGH (ref 12.0–15.0)
MCH: 28 pg (ref 26.0–34.0)
MCHC: 32.1 g/dL (ref 30.0–36.0)
MCV: 87.2 fL (ref 80.0–100.0)
Platelets: 380 10*3/uL (ref 150–400)
RBC: 5.4 MIL/uL — ABNORMAL HIGH (ref 3.87–5.11)
RDW: 13.4 % (ref 11.5–15.5)
WBC: 10.6 10*3/uL — ABNORMAL HIGH (ref 4.0–10.5)
nRBC: 0 % (ref 0.0–0.2)

## 2019-06-09 LAB — BASIC METABOLIC PANEL
Anion gap: 9 (ref 5–15)
BUN: 11 mg/dL (ref 6–20)
CO2: 24 mmol/L (ref 22–32)
Calcium: 9 mg/dL (ref 8.9–10.3)
Chloride: 109 mmol/L (ref 98–111)
Creatinine, Ser: 0.83 mg/dL (ref 0.44–1.00)
GFR calc Af Amer: >60 mL/min (ref >60)
GFR calc non Af Amer: >60 mL/min (ref >60)
Glucose, Bld: 106 mg/dL — ABNORMAL HIGH (ref 70–99)
Potassium: 3.8 mmol/L (ref 3.5–5.1)
Sodium: 142 mmol/L (ref 135–145)

## 2019-06-09 MED ORDER — IOHEXOL 350 MG/ML SOLN
100.0000 mL | Freq: Once | INTRAVENOUS | Status: AC | PRN
Start: 2019-06-09 — End: 2019-06-09
  Administered 2019-06-09: 100 mL via INTRAVENOUS

## 2019-06-09 NOTE — ED Triage Notes (Signed)
Pt states that she is having pain in her chest pains and it feels like her heart is racing. This has been going on since 2100 last night

## 2019-06-09 NOTE — Discharge Instructions (Addendum)
You were seen in the emergency department for palpitations and chest discomfort with shortness of breath.  You had blood work EKG chest x-ray and a CAT scan of your chest.  There is no evidence of a heart attack or significant blood clot.  Will be important for you to follow-up with your regular doctor for further evaluation.  Please return if any worsening symptoms.

## 2019-06-09 NOTE — ED Provider Notes (Signed)
[facility] EMERGENCY DEPARTMENT Provider Note   CSN: [id number] Arrival date & time: 06/09/19  1224     History   Chief Complaint Chief Complaint  Patient presents with   Chest Pain    HPI Kristine Miller is a 58 y.o. female.  She has a history of MS.  No prior cardiac history.  She said her heart has been beating fast and hard since 9 PM last night that causes her pain like a pressure in her chest or like she is feeling like she is being punched in her chest.  It causes her to feel short of breath.  Otherwise was not associated with any diaphoresis dizziness nausea vomiting.  She said she has had palpitations in the past but never this discomfort.  No fevers or chills.  No cough or sore throat.  She said she will sometimes get her palpitations before she has an MS flare which is usually some numbness in extremity.  She said she is feeling a little tingling down her left arm.     The history is provided by the patient.  Chest Pain Pain location:  Substernal area Pain quality: aching and pressure   Pain radiates to:  Does not radiate Pain severity:  Moderate Onset quality:  Sudden Duration:  18 hours Timing:  Constant Progression:  Unchanged Chronicity:  New Context: at rest   Relieved by:  None tried Worsened by:  Nothing Ineffective treatments:  None tried Associated symptoms: numbness and shortness of breath   Associated symptoms: no abdominal pain, no altered mental status, no back pain, no dysphagia, no fever, no headache, no lower extremity edema, no nausea, no near-syncope, no syncope and no vomiting   Risk factors: no aortic disease, no coronary artery disease, no diabetes mellitus, no high cholesterol, no hypertension, no prior DVT/PE and no smoking     Past Medical History:  Diagnosis Date   Anxiety    Asthma    Chronic headaches    IBS (irritable bowel syndrome)    MS (multiple sclerosis) ([facility])    Palpitations    Pneumonia    Rotator cuff  arthropathy, left     Patient Active Problem List   Diagnosis Date Noted   Change in mole 02/24/2017   Cellulitis of chest wall 02/24/2017   Numbness 03/16/2016   Left-sided weakness 03/16/2016   MS (multiple sclerosis) ([place]) 04/28/2014   Multiple sclerosis exacerbation ([place]) 04/28/2014   Difficulty in walking(719.7) 08/23/2013   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[place]) 08/23/2013   Abnormal brain MRI 08/20/2013   Anxiety state, unspecified 08/19/2013   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 Authorizing Provider  acetaminophen (TYLENOL) 500 MG tablet Take 1,000 mg by mouth every 6 (six) hours as needed for headache.    [provider]  aspirin 81 MG chewable tablet Chew 324 mg by mouth daily as needed for moderate pain.    [provider]  aspirin-acetaminophen-caffeine (EXCEDRIN MIGRAINE) [phone] MG tablet Take 2 tablets by mouth every 6 (six) hours as needed for headache.    [provider]  cephALEXin (KEFLEX) 500 MG capsule Take 1 capsule (500 mg total) by mouth 4 (four) times daily. 02/15/17   Mesner, Corene Cornea, MD  ondansetron (ZOFRAN ODT) 4 MG disintegrating tablet Take 1 tablet (4 mg total) by mouth every 8 (eight) hours as needed for nausea or vomiting. 04/13/18   Horton, Barbette Hair, MD  OVER  THE COUNTER MEDICATION Take 1 capsule by mouth daily. Essential oil "DDR" for MS.    [provider]  oxyCODONE-acetaminophen (PERCOCET/ROXICET) 5-325 MG tablet Take 1 tablet by mouth every 6 (six) hours as needed for severe pain. 04/13/18   Horton, Barbette Hair, MD  predniSONE (DELTASONE) 20 MG tablet Take 1 tablet (20 mg total) by mouth 2 (two) times daily. 03/29/18   Daleen Bo, MD  tamsulosin (FLOMAX) 0.4 MG CAPS capsule Take 1 capsule (0.4 mg total) by mouth daily. 04/13/18   Horton, Barbette Hair, MD    Family History Family History  Problem Relation Age of Onset   Cancer Mother    Diabetes Mother    Diabetes type I Daughter    Other Daughter        stomach issue   Cancer Maternal Grandmother    Cancer Maternal Grandfather    Congestive Heart Failure Maternal Grandfather    Other Sister        blood clot in leg   Seizures Son     Social History Social History   Tobacco Use   Smoking status: Never Smoker   Smokeless tobacco: Never Used  Substance Use Topics   Alcohol use: No   Drug use: No     Allergies   Morphine and related, Penicillins, and Prednisone   Review of Systems Review of Systems  Constitutional: Negative for fever.  HENT: Negative for sore throat and trouble swallowing.   Eyes: Negative for visual disturbance.  Respiratory: Positive for shortness of breath.   Cardiovascular: Positive for chest pain. Negative for syncope and near-syncope.  Gastrointestinal: Negative for abdominal pain, nausea and vomiting.  Genitourinary: Negative for dysuria.  Musculoskeletal: Negative for back pain.  Skin: Negative for rash.  Neurological: Positive for numbness. Negative for speech difficulty and headaches.     Physical Exam Updated Vital Signs BP (!) 159/98 (BP Location: Right Arm)    Pulse 94    Temp 98.1 F (36.7 C) (Oral)    Resp 14    Ht 5' (1.524 m)    Wt 107 kg    SpO2 96%    BMI 46.09 kg/m   Physical Exam Vitals signs and nursing  note reviewed.  Constitutional:      General: She is not in acute distress.    Appearance: She is well-developed.  HENT:     Head: Normocephalic and atraumatic.  Eyes:     Conjunctiva/sclera: Conjunctivae normal.  Neck:     Musculoskeletal: Neck supple.  Cardiovascular:     Rate and Rhythm: Normal rate and regular rhythm.  Heart sounds: Normal heart sounds. No murmur.  Pulmonary:     Effort: Pulmonary effort is normal. No respiratory distress.     Breath sounds: Normal breath sounds.  Abdominal:     Palpations: Abdomen is soft.     Tenderness: There is no abdominal tenderness.  Musculoskeletal: Normal range of motion.     Right lower leg: She exhibits no tenderness. No edema.     Left lower leg: She exhibits no tenderness. No edema.  Skin:    General: Skin is warm and dry.     Capillary Refill: Capillary refill takes less than 2 seconds.  Neurological:     General: No focal deficit present.     Mental Status: She is alert and oriented to person, place, and time.      ED Treatments / Results  Labs (all labs ordered are listed, but only abnormal results are displayed) Labs Reviewed  BASIC METABOLIC PANEL - Abnormal; Notable for the following components:      Result Value   Glucose, Bld 106 (*)    All other components within normal limits  CBC - Abnormal; Notable for the following components:   WBC 10.6 (*)    RBC 5.40 (*)    Hemoglobin 15.1 (*)    HCT 47.1 (*)    All other components within normal limits  D-DIMER, QUANTITATIVE (NOT AT [facility]) - Abnormal; Notable for the following components:   D-Dimer, Quant 0.78 (*)    All other components within normal limits  TROPONIN I (HIGH SENSITIVITY)  TROPONIN I (HIGH SENSITIVITY)    EKG EKG Interpretation  Date/Time:  Sunday June 09 2019 15:35:13 EDT Ventricular Rate:  79 PR Interval:    QRS Duration: 96 QT Interval:  365 QTC Calculation: 419 R Axis:   -46 Text Interpretation:  Sinus rhythm Atrial premature  complex LAD, consider left anterior fascicular block Abnormal R-wave progression, late transition Borderline T abnormalities, anterior leads similar to prior 5/17 other than new PACs Confirmed by Aletta Edouard [phone]) on 06/09/2019 3:38:31 PM   Radiology Ct Angio Chest Pe W/cm &/or Wo Cm  Result Date: 06/09/2019 CLINICAL DATA:  Elevated D-dimer, chest pain, feels like heart is racing, symptoms since 2100 hours last night, question pulmonary embolism EXAM: CT ANGIOGRAPHY CHEST WITH CONTRAST TECHNIQUE: Multidetector CT imaging of the chest was performed using the standard protocol during bolus administration of intravenous contrast. Multiplanar CT image reconstructions and MIPs were obtained to evaluate the vascular anatomy. CONTRAST:  129mL OMNIPAQUE IOHEXOL 350 MG/ML SOLN IV COMPARISON:  03/17/2016 FINDINGS: Cardiovascular: Aorta normal caliber without aneurysm or dissection. Heart unremarkable. No pericardial effusion. Pulmonary arteries well opacified and patent. No evidence of pulmonary embolism. Mediastinum/Nodes: Base of cervical region normal appearance. Esophagus unremarkable. No thoracic adenopathy. Lungs/Pleura: Minimal bibasilar atelectasis. Lungs otherwise clear. No infiltrate, pleural effusion or pneumothorax. Upper Abdomen: Gallbladder surgically absent. Remaining visualized upper abdomen unremarkable Musculoskeletal: No acute osseous findings. Review of the MIP images confirms the above findings. IMPRESSION: No evidence of pulmonary embolism. Minimal bibasilar atelectasis. Electronically Signed   By: Lavonia Dana M.D.   On: 06/09/2019 17:42   Dg Chest Port 1 View  Result Date: 06/09/2019 CLINICAL DATA:  Acute chest pain EXAM: PORTABLE CHEST 1 VIEW COMPARISON:  02/15/2017 and prior radiographs FINDINGS: This is a mildly low volume study. The cardiomediastinal silhouette is unremarkable. There is no evidence of focal airspace disease, pulmonary edema, suspicious pulmonary nodule/mass, pleural  effusion, or pneumothorax. No acute bony abnormalities are identified. IMPRESSION: No  active disease. Electronically Signed   By: Margarette Canada M.D.   On: 06/09/2019 15:30    Procedures Procedures (including critical care time)  Medications Ordered in ED Medications - No data to display   Initial Impression / Assessment and Plan / ED Course  I have reviewed the triage vital signs and the nursing notes.  Pertinent labs & imaging results that were available during my care of the patient were reviewed by me and considered in my medical decision making (see chart for details).  Clinical Course as of Jun 09 953  Sun Jun 08, 5524  2969 58 year old female with no prior cardiac history here with palpitations chest pressure shortness of breath.  She said she still feels her heart beating hard and fast although heart rate is in the 80s here.  Sats are 96 to 100% on room air.  Appears in no distress.  Differential includes ACS, palpitations, metabolic derangement, PE, pneumonia.  Reviewed her prior D-dimers and they are usually elevated so we will get a d-dimer but unfortunately she may end up needing the CT.  She has a family history of having DVT and PE although no personal history of same.   [MB]  1157 Chest x-ray reviewed by me, no gross infiltrates.   [MB]  1701 D-dimer is elevated so I updated the patient and put her in for a CT PE.   [MB]  2620 CT reviewed by me.  No gross PE.   [MB]  1751 [place] essentially unchanged.  Will review with patient and plan for discharge.   [MB]    Clinical Course User Index [MB] Hayden Rasmussen, MD       Final Clinical Impressions(s) / ED Diagnoses   Final diagnoses:  Nonspecific chest pain  Palpitations    ED Discharge Orders    None       Hayden Rasmussen, MD 06/10/19 [phone]

## 2021-08-17 ENCOUNTER — Emergency Department (HOSPITAL_COMMUNITY): Payer: Self-pay

## 2021-08-17 ENCOUNTER — Other Ambulatory Visit: Payer: Self-pay

## 2021-08-17 ENCOUNTER — Observation Stay (HOSPITAL_COMMUNITY)
Admission: EM | Admit: 2021-08-17 | Discharge: 2021-08-18 | Disposition: A | Discharge status: Home / Self Care | Payer: Self-pay | Attending: Family Medicine | Admitting: Family Medicine

## 2021-08-17 DIAGNOSIS — I10 Essential (primary) hypertension: Secondary | ICD-10-CM | POA: Insufficient documentation

## 2021-08-17 DIAGNOSIS — R9431 Abnormal electrocardiogram [ECG] [EKG]: Secondary | ICD-10-CM | POA: Diagnosis present

## 2021-08-17 DIAGNOSIS — E876 Hypokalemia: Secondary | ICD-10-CM | POA: Insufficient documentation

## 2021-08-17 DIAGNOSIS — I2693 Single subsegmental pulmonary embolism without acute cor pulmonale: Secondary | ICD-10-CM

## 2021-08-17 DIAGNOSIS — I2699 Other pulmonary embolism without acute cor pulmonale: Principal | ICD-10-CM | POA: Insufficient documentation

## 2021-08-17 DIAGNOSIS — J45909 Unspecified asthma, uncomplicated: Secondary | ICD-10-CM | POA: Insufficient documentation

## 2021-08-17 DIAGNOSIS — I4581 Long QT syndrome: Secondary | ICD-10-CM | POA: Insufficient documentation

## 2021-08-17 DIAGNOSIS — R519 Headache, unspecified: Secondary | ICD-10-CM | POA: Insufficient documentation

## 2021-08-17 DIAGNOSIS — R52 Pain, unspecified: Secondary | ICD-10-CM

## 2021-08-17 DIAGNOSIS — Z7982 Long term (current) use of aspirin: Secondary | ICD-10-CM | POA: Insufficient documentation

## 2021-08-17 DIAGNOSIS — Z20822 Contact with and (suspected) exposure to covid-19: Secondary | ICD-10-CM | POA: Insufficient documentation

## 2021-08-17 LAB — CBC
HCT: 44.4 % (ref 36.0–46.0)
Hemoglobin: 14.7 g/dL (ref 12.0–15.0)
MCH: 29.6 pg (ref 26.0–34.0)
MCHC: 33.1 g/dL (ref 30.0–36.0)
MCV: 89.5 fL (ref 80.0–100.0)
Platelets: 411 10*3/uL — ABNORMAL HIGH (ref 150–400)
RBC: 4.96 MIL/uL (ref 3.87–5.11)
RDW: 13.8 % (ref 11.5–15.5)
WBC: 13.8 10*3/uL — ABNORMAL HIGH (ref 4.0–10.5)
nRBC: 0 % (ref 0.0–0.2)

## 2021-08-17 LAB — BASIC METABOLIC PANEL
Anion gap: 7 (ref 5–15)
BUN: 9 mg/dL (ref 6–20)
CO2: 28 mmol/L (ref 22–32)
Calcium: 9.2 mg/dL (ref 8.9–10.3)
Chloride: 104 mmol/L (ref 98–111)
Creatinine, Ser: 0.81 mg/dL (ref 0.44–1.00)
GFR, Estimated: >60 mL/min (ref >60)
Glucose, Bld: 101 mg/dL — ABNORMAL HIGH (ref 70–99)
Potassium: 3.8 mmol/L (ref 3.5–5.1)
Sodium: 139 mmol/L (ref 135–145)

## 2021-08-17 LAB — TROPONIN I (HIGH SENSITIVITY): Troponin I (High Sensitivity): 3 ng/L (ref <18)

## 2021-08-17 MED ORDER — KETOROLAC TROMETHAMINE 30 MG/ML IJ SOLN
30.0000 mg | Freq: Once | INTRAMUSCULAR | Status: AC
  Administered 2021-08-17: 30 mg via INTRAVENOUS
  Filled 2021-08-17: qty 1

## 2021-08-17 NOTE — ED Provider Notes (Signed)
[facility] EMERGENCY DEPARTMENT Provider Note   CSN: [id number] Arrival date & time: 08/17/21  2215     History Chief Complaint  Patient presents with   Chest Pain    Kristine Miller is a 60 y.o. female.  Patient is a 60 year old female with past medical history of multiple sclerosis, asthma, and IBS.  Patient presenting today for evaluation of chest pain.  This started at approximately 630 this morning.  She describes a sharp pain to the left upper chest that has come and gone throughout the day.  It seems to be worse when she breathes.  She reports being somewhat short of breath when she ambulates, but denies fevers, chills, or productive cough.  She denies leg swelling.   She also reports occasional sharp pains to the left parietal region of her head.  This has come and gone over the past week.  She denies any visual disturbances, numbness, or tingling.  Patient has no prior cardiac history.  She has no cardiac risk factors, but does report her mother and sister both having had blood clots in the past.  The history is provided by the patient.  Chest Pain Pain location:  L chest Pain quality: sharp   Pain radiates to:  Does not radiate Pain severity:  Moderate Onset quality:  Sudden Timing:  Constant Progression:  Worsening Chronicity:  New     Past Medical History:  Diagnosis Date   Anxiety    Asthma    Chronic headaches    IBS (irritable bowel syndrome)    MS (multiple sclerosis) (HCC)    Palpitations    Pneumonia    Rotator cuff arthropathy, left     Patient Active Problem List   Diagnosis Date Noted   Change in mole 02/24/2017   Cellulitis of chest wall 02/24/2017   Numbness 03/16/2016   Left-sided weakness 03/16/2016   MS (multiple sclerosis) ([place]) 04/28/2014   Multiple sclerosis exacerbation ([place]) 04/28/2014   Difficulty in walking(719.7) 08/23/2013   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[place]) 08/23/2013   Abnormal brain MRI 08/20/2013   Anxiety  state, unspecified 08/19/2013   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   Leukocytosis, unspecified 08/19/2013   Chest pain 04/06/2012   Palpitations 04/06/2012   Obesity 04/06/2012   Hypokalemia 04/06/2012    Past Surgical History:  Procedure Laterality Date   ABDOMINAL HYSTERECTOMY     APPENDECTOMY     CESAREAN SECTION     CHOLECYSTECTOMY     plate and pin in right arm     ROTATOR CUFF REPAIR       OB History     Gravida  5   Para  2   Term  2   Preterm      AB  3   Living  2      SAB  3   IAB      Ectopic      Multiple      Live Births  2           Family History  Problem Relation Age of Onset   Cancer Mother    Diabetes Mother    Diabetes type I Daughter    Other Daughter        stomach issue   Cancer Maternal Grandmother    Cancer Maternal Grandfather    Congestive Heart Failure Maternal Grandfather    Other Sister        blood clot in leg  Seizures Son     Social History   Tobacco Use   Smoking status: Never   Smokeless tobacco: Never  Substance Use Topics   Alcohol use: No   Drug use: No    Home Medications Prior to Admission medications   Medication Sig Start Date End Date Taking? Authorizing Provider  acetaminophen (TYLENOL) 500 MG tablet Take 1,000 mg by mouth every 6 (six) hours as needed for headache.    [provider]  aspirin 81 MG chewable tablet Chew 324 mg by mouth daily as needed for moderate pain.    [provider]  aspirin-acetaminophen-caffeine (EXCEDRIN MIGRAINE) [phone] MG tablet Take 2 tablets by mouth every 6 (six) hours as needed for headache.    [provider]  cephALEXin (KEFLEX) 500 MG capsule Take 1 capsule (500 mg total) by mouth 4 (four) times daily. 02/15/17   Mesner, Corene Cornea, MD  ondansetron (ZOFRAN ODT) 4 MG disintegrating tablet Take 1 tablet (4 mg total) by mouth every 8 (eight) hours as needed for nausea or vomiting. 04/13/18   Horton, Barbette Hair, MD   OVER THE COUNTER MEDICATION Take 1 capsule by mouth daily. Essential oil "DDR" for MS.    [provider]  oxyCODONE-acetaminophen (PERCOCET/ROXICET) 5-325 MG tablet Take 1 tablet by mouth every 6 (six) hours as needed for severe pain. 04/13/18   Horton, Barbette Hair, MD  predniSONE (DELTASONE) 20 MG tablet Take 1 tablet (20 mg total) by mouth 2 (two) times daily. 03/29/18   Daleen Bo, MD  tamsulosin (FLOMAX) 0.4 MG CAPS capsule Take 1 capsule (0.4 mg total) by mouth daily. 04/13/18   Horton, Barbette Hair, MD    Allergies    Morphine and related, Penicillins, and Prednisone  Review of Systems   Review of Systems  Cardiovascular:  Positive for chest pain.  All other systems reviewed and are negative.  Physical Exam Updated Vital Signs BP (!) 164/85 (BP Location: Right Arm)   Pulse 99   Temp 98 F (36.7 C) (Oral)   Resp 20   Ht 5' (1.524 m)   Wt 106.1 kg   SpO2 98%   BMI 45.70 kg/m   Physical Exam Vitals and nursing note reviewed.  Constitutional:      General: She is not in acute distress.    Appearance: She is well-developed. She is not diaphoretic.  HENT:     Head: Normocephalic and atraumatic.  Cardiovascular:     Rate and Rhythm: Normal rate and regular rhythm.     Heart sounds: No murmur heard.   No friction rub. No gallop.  Pulmonary:     Effort: Pulmonary effort is normal. No respiratory distress.     Breath sounds: Normal breath sounds. No wheezing.  Chest:     Chest wall: No crepitus.  Abdominal:     General: Bowel sounds are normal. There is no distension.     Palpations: Abdomen is soft.     Tenderness: There is no abdominal tenderness.  Musculoskeletal:        General: Normal range of motion.     Cervical back: Normal range of motion and neck supple.     Right lower leg: No tenderness. No edema.     Left lower leg: No tenderness. No edema.  Skin:    General: Skin is warm and dry.  Neurological:     Mental Status: She is alert and oriented to  person, place, and time.    ED Results / Procedures / Treatments  Labs (all labs ordered are listed, but only abnormal results are displayed) Labs Reviewed  BASIC METABOLIC PANEL - Abnormal; Notable for the following components:      Result Value   Glucose, Bld 101 (*)    All other components within normal limits  CBC - Abnormal; Notable for the following components:   WBC 13.8 (*)    Platelets 411 (*)    All other components within normal limits  D-DIMER, QUANTITATIVE  POC URINE PREG, ED  TROPONIN I (HIGH SENSITIVITY)  TROPONIN I (HIGH SENSITIVITY)    EKG EKG Interpretation  Date/Time:  Tuesday August 17 2021 22:20:53 EDT Ventricular Rate:  101 PR Interval:    QRS Duration: 92 QT Interval:  538 QTC Calculation: 697 R Axis:   -60 Text Interpretation:  Critical Test Result: Long QTc Accelerated Sinus Rhythm Left anterior fascicular block Nonspecific ST abnormality Prolonged QT Abnormal ECG Since last tracing QT has lengthened Confirmed by Daleen Bo [id number]) on 08/17/2021 10:27:01 PM  Radiology DG Chest 2 View  Result Date: 08/17/2021 CLINICAL DATA:  Chest pain for few days EXAM: CHEST - 2 VIEW COMPARISON:  06/09/2019 FINDINGS: Cardiac shadow is within normal limits. Lungs are well aerated bilaterally. No focal bony abnormality is seen. IMPRESSION: No active cardiopulmonary disease. Electronically Signed   By: Inez Catalina M.D.   On: 08/17/2021 23:03    Procedures Procedures   Medications Ordered in ED Medications  ketorolac (TORADOL) 30 MG/ML injection 30 mg (has no administration in time range)    ED Course  I have reviewed the triage vital signs and the nursing notes.  Pertinent labs & imaging results that were available during my care of the patient were reviewed by me and considered in my medical decision making (see chart for details).    MDM Rules/Calculators/A&P  Patient with history of MS presenting with complaints of left upper chest pain that is  pleuritic in nature.  She arrives with stable vital signs and symptoms that are atypical for a cardiac etiology.  Her EKG is unchanged and troponin is negative.  She did return with a slightly elevated D-dimer.  For this reason a CTA of the chest was obtained which did show a left upper lobe subsegmental PE.  Care discussed with Dr. Clearence Ped who feels as though admission is most appropriate.  Heparin initiated and patient to be admitted to the hospitalist service.  CRITICAL CARE Performed by: Veryl Speak Total critical care time: 35 minutes Critical care time was exclusive of separately billable procedures and treating other patients. Critical care was necessary to treat or prevent imminent or life-threatening deterioration. Critical care was time spent personally by me on the following activities: development of treatment plan with patient and/or surrogate as well as nursing, discussions with consultants, evaluation of patient's response to treatment, examination of patient, obtaining history from patient or surrogate, ordering and performing treatments and interventions, ordering and review of laboratory studies, ordering and review of radiographic studies, pulse oximetry and re-evaluation of patient's condition.   Final Clinical Impression(s) / ED Diagnoses Final diagnoses:  None    Rx / DC Orders ED Discharge Orders     None        Veryl Speak, MD 08/18/21 [phone]

## 2021-08-17 NOTE — ED Triage Notes (Signed)
Pt. States they have are having chest pain in the center of their chest, left arm feels tingly occasionally and they get [facility] sometimes. Pt. States this has been on going since 6:15 this morning.

## 2021-08-18 ENCOUNTER — Observation Stay (HOSPITAL_COMMUNITY): Payer: Self-pay

## 2021-08-18 ENCOUNTER — Emergency Department (HOSPITAL_COMMUNITY): Payer: Self-pay

## 2021-08-18 ENCOUNTER — Observation Stay (HOSPITAL_BASED_OUTPATIENT_CLINIC_OR_DEPARTMENT_OTHER): Payer: Self-pay

## 2021-08-18 DIAGNOSIS — R9431 Abnormal electrocardiogram [ECG] [EKG]: Secondary | ICD-10-CM

## 2021-08-18 DIAGNOSIS — I2699 Other pulmonary embolism without acute cor pulmonale: Secondary | ICD-10-CM | POA: Diagnosis present

## 2021-08-18 DIAGNOSIS — I2693 Single subsegmental pulmonary embolism without acute cor pulmonale: Secondary | ICD-10-CM

## 2021-08-18 LAB — CBC
HCT: 41.6 % (ref 36.0–46.0)
Hemoglobin: 13.9 g/dL (ref 12.0–15.0)
MCH: 29.8 pg (ref 26.0–34.0)
MCHC: 33.4 g/dL (ref 30.0–36.0)
MCV: 89.1 fL (ref 80.0–100.0)
Platelets: 393 10*3/uL (ref 150–400)
RBC: 4.67 MIL/uL (ref 3.87–5.11)
RDW: 13.9 % (ref 11.5–15.5)
WBC: 12.8 10*3/uL — ABNORMAL HIGH (ref 4.0–10.5)
nRBC: 0 % (ref 0.0–0.2)

## 2021-08-18 LAB — COMPREHENSIVE METABOLIC PANEL
ALT: 14 U/L (ref 0–44)
ALT: 15 U/L (ref 0–44)
AST: 14 U/L — ABNORMAL LOW (ref 15–41)
AST: 15 U/L (ref 15–41)
Albumin: 3.4 g/dL — ABNORMAL LOW (ref 3.5–5.0)
Albumin: 3.4 g/dL — ABNORMAL LOW (ref 3.5–5.0)
Alkaline Phosphatase: 76 U/L (ref 38–126)
Alkaline Phosphatase: 77 U/L (ref 38–126)
Anion gap: 7 (ref 5–15)
Anion gap: 7 (ref 5–15)
BUN: 10 mg/dL (ref 6–20)
BUN: 10 mg/dL (ref 6–20)
CO2: 26 mmol/L (ref 22–32)
CO2: 26 mmol/L (ref 22–32)
Calcium: 9 mg/dL (ref 8.9–10.3)
Calcium: 9 mg/dL (ref 8.9–10.3)
Chloride: 106 mmol/L (ref 98–111)
Chloride: 106 mmol/L (ref 98–111)
Creatinine, Ser: 0.8 mg/dL (ref 0.44–1.00)
Creatinine, Ser: 0.82 mg/dL (ref 0.44–1.00)
GFR, Estimated: >60 mL/min (ref >60)
GFR, Estimated: >60 mL/min (ref >60)
Glucose, Bld: 105 mg/dL — ABNORMAL HIGH (ref 70–99)
Glucose, Bld: 106 mg/dL — ABNORMAL HIGH (ref 70–99)
Potassium: 3.4 mmol/L — ABNORMAL LOW (ref 3.5–5.1)
Potassium: 3.5 mmol/L (ref 3.5–5.1)
Sodium: 139 mmol/L (ref 135–145)
Sodium: 139 mmol/L (ref 135–145)
Total Bilirubin: 0.6 mg/dL (ref 0.3–1.2)
Total Bilirubin: 0.6 mg/dL (ref 0.3–1.2)
Total Protein: 6.7 g/dL (ref 6.5–8.1)
Total Protein: 6.8 g/dL (ref 6.5–8.1)

## 2021-08-18 LAB — TROPONIN I (HIGH SENSITIVITY): Troponin I (High Sensitivity): 3 ng/L (ref <18)

## 2021-08-18 LAB — HIV ANTIBODY (ROUTINE TESTING W REFLEX): HIV Screen 4th Generation wRfx: NONREACTIVE

## 2021-08-18 LAB — ECHOCARDIOGRAM COMPLETE
AR max vel: 2.57 cm2
AV Area VTI: 2.71 cm2
AV Area mean vel: 2.51 cm2
AV Mean grad: 3 mmHg
AV Peak grad: 5.3 mmHg
Ao pk vel: 1.15 m/s
Area-P 1/2: 4.68 cm2
Height: 60 in
MV VTI: 3.13 cm2
S' Lateral: 3 cm
Weight: 3744 oz

## 2021-08-18 LAB — D-DIMER, QUANTITATIVE: D-Dimer, Quant: 0.72 ug/mL-FEU — ABNORMAL HIGH (ref 0.00–0.50)

## 2021-08-18 LAB — PROTIME-INR
INR: 1.1 (ref 0.8–1.2)
Prothrombin Time: 13.7 seconds (ref 11.4–15.2)

## 2021-08-18 LAB — MAGNESIUM: Magnesium: 2 mg/dL (ref 1.7–2.4)

## 2021-08-18 LAB — RESP PANEL BY RT-PCR (FLU A&B, COVID) ARPGX2
Influenza A by PCR: NEGATIVE
Influenza B by PCR: NEGATIVE
SARS Coronavirus 2 by RT PCR: NEGATIVE

## 2021-08-18 LAB — HEPARIN LEVEL (UNFRACTIONATED): Heparin Unfractionated: >1.1 IU/mL — ABNORMAL HIGH (ref 0.30–0.70)

## 2021-08-18 LAB — POC URINE PREG, ED: Preg Test, Ur: NEGATIVE

## 2021-08-18 MED ORDER — PROCHLORPERAZINE EDISYLATE 10 MG/2ML IJ SOLN: 10.0000 mg | Freq: Four times a day (QID) | INTRAMUSCULAR | Status: DC | PRN

## 2021-08-18 MED ORDER — ACETAMINOPHEN 325 MG PO TABS: 650.0000 mg | ORAL_TABLET | Freq: Four times a day (QID) | ORAL | Status: DC | PRN

## 2021-08-18 MED ORDER — ASPIRIN 81 MG PO CHEW: 324.0000 mg | CHEWABLE_TABLET | Freq: Every day | ORAL | Status: DC | PRN

## 2021-08-18 MED ORDER — ONDANSETRON HCL 4 MG/2ML IJ SOLN
4.0000 mg | Freq: Four times a day (QID) | INTRAMUSCULAR | Status: DC | PRN
Start: 2021-08-18 — End: 2021-08-18

## 2021-08-18 MED ORDER — BLOOD PRESSURE KIT: 1.0000 | PACK | Freq: Every day | 0 refills | Status: DC

## 2021-08-18 MED ORDER — HEPARIN BOLUS VIA INFUSION
5000.0000 [IU] | Freq: Once | INTRAVENOUS | Status: AC
  Administered 2021-08-18: 5000 [IU] via INTRAVENOUS

## 2021-08-18 MED ORDER — HEPARIN (PORCINE) 25000 UT/250ML-% IV SOLN
1300.0000 [IU]/h | INTRAVENOUS | Status: DC
  Administered 2021-08-18: 1300 [IU]/h via INTRAVENOUS
  Filled 2021-08-18: qty 250

## 2021-08-18 MED ORDER — APIXABAN 5 MG PO TABS: 5.0000 mg | ORAL_TABLET | Freq: Two times a day (BID) | ORAL | 6 refills | Status: DC

## 2021-08-18 MED ORDER — OXYCODONE-ACETAMINOPHEN 5-325 MG PO TABS
1.0000 | ORAL_TABLET | Freq: Four times a day (QID) | ORAL | Status: DC | PRN
  Administered 2021-08-18: 1 via ORAL
  Filled 2021-08-18: qty 1

## 2021-08-18 MED ORDER — AMLODIPINE BESYLATE 5 MG PO TABS: 5.0000 mg | ORAL_TABLET | Freq: Every day | ORAL | 3 refills | Status: DC

## 2021-08-18 MED ORDER — APIXABAN 5 MG PO TABS: 10.0000 mg | ORAL_TABLET | Freq: Two times a day (BID) | ORAL | 0 refills | Status: DC

## 2021-08-18 MED ORDER — APIXABAN 5 MG PO TABS: 5.0000 mg | ORAL_TABLET | Freq: Two times a day (BID) | ORAL | Status: DC

## 2021-08-18 MED ORDER — IOHEXOL 350 MG/ML SOLN
100.0000 mL | Freq: Once | INTRAVENOUS | Status: AC | PRN
  Administered 2021-08-18: 100 mL via INTRAVENOUS

## 2021-08-18 MED ORDER — TAMSULOSIN HCL 0.4 MG PO CAPS
0.4000 mg | ORAL_CAPSULE | Freq: Every day | ORAL | Status: DC
  Filled 2021-08-18: qty 1

## 2021-08-18 MED ORDER — APIXABAN 5 MG PO TABS
10.0000 mg | ORAL_TABLET | Freq: Two times a day (BID) | ORAL | Status: DC
Start: 2021-08-18 — End: 2021-08-18
  Administered 2021-08-18: 10 mg via ORAL
  Filled 2021-08-18: qty 2

## 2021-08-18 MED ORDER — AMLODIPINE BESYLATE 5 MG PO TABS
5.0000 mg | ORAL_TABLET | Freq: Every day | ORAL | Status: DC
  Administered 2021-08-18: 5 mg via ORAL
  Filled 2021-08-18: qty 1

## 2021-08-18 NOTE — Progress Notes (Signed)
ANTICOAGULATION CONSULT NOTE - Initial Consult  Pharmacy Consult for Heparin Indication: pulmonary embolus  Allergies  Allergen Reactions   Morphine And Related Swelling   Penicillins Nausea And Vomiting    Has patient had a PCN reaction causing immediate rash, facial/tongue/throat swelling, SOB or lightheadedness with hypotension: No Has patient had a PCN reaction causing severe rash involving mucus membranes or skin necrosis: No Has patient had a PCN reaction that required hospitalization No Has patient had a PCN reaction occurring within the last 10 years: No If all of the above answers are "NO", then may proceed with Cephalosporin use.    Prednisone Nausea And Vomiting    Patient Measurements: Height: 5' (152.4 cm) Weight: 106.1 kg (234 lb) IBW/kg (Calculated) : 45.5 Heparin Dosing Weight: 72 kg  Vital Signs: Temp: 98 F (36.7 C) (10/04 2223) Temp Source: Oral (10/04 2223) BP: 128/58 (10/05 0130) Pulse Rate: 74 (10/05 0130)  Labs: Recent Labs    08/17/21 2237 08/18/21 0015  HGB 14.7  --   HCT 44.4  --   PLT 411*  --   CREATININE 0.81  --   TROPONINIHS 3 3    Estimated Creatinine Clearance: 83.3 mL/min (by C-G formula based on SCr of 0.81 mg/dL).   Medical History: Past Medical History:  Diagnosis Date   Anxiety    Asthma    Chronic headaches    IBS (irritable bowel syndrome)    MS (multiple sclerosis) (HCC)    Palpitations    Pneumonia    Rotator cuff arthropathy, left     Medications:  See electronic med rec  Assessment: 60 y.o. F presents with SOB and CP. Chest CT shows small subsegmental pulmonary embolus in a branch of the left upper lobe pulmonary artery. To begin heparin per pharmacy. No AC PTA. CBC ok on admission.  Goal of Therapy:  Heparin level 0.3-0.7 units/ml Monitor platelets by anticoagulation protocol: Yes   Plan:  Heparin IV bolus 5000 units Heparin gtt at 1300 units/hr Will f/u heparin level in 6 hours Daily heparin level  and CBC  Sherlon Handing, PharmD, BCPS Please see amion for complete clinical pharmacist phone list 08/18/2021,1:55 AM

## 2021-08-18 NOTE — Progress Notes (Signed)
*  PRELIMINARY RESULTS* Echocardiogram 2D Echocardiogram has been performed.  Kristine Miller 08/18/2021, 12:22 PM

## 2021-08-18 NOTE — TOC Progression Note (Signed)
Transition of Care [facility]) - Progression Note    Patient Details  Name: Kristine Miller MRN: [id number] Date of Birth: Oct 28, 1961  [facility] CM/SW Contact  Salome Arnt, [place] Phone Number: 08/18/2021, 9:54 AM  Clinical Narrative:  TOC received consult for eliquis assistance. Pt has no insurance. Discussed with pharmacy who will provide 30 day voucher and information for pt to apply for patient assistance program. TOC will continue to follow.        Barriers to Discharge: Continued Medical Work up  Expected Discharge Plan and Services                                                 Social Determinants of Health (SDOH) Interventions    Readmission Risk Interventions No flowsheet data found.

## 2021-08-18 NOTE — H&P (Signed)
TRH H&P    Patient Demographics:    Kristine Miller, is a 60 y.o. female  MRN: [id number]  DOB - Feb 08, 1961  Admit Date - 08/17/2021  Referring MD/NP/PA: Stark Jock  Outpatient Primary MD for the patient is Kathyrn Drown, MD  Patient coming from: Home   Chief complaint- Left chest pain   HPI:    Kristine Miller  is a 60 y.o. female, with history of multiple sclerosis, anxiety, asthma, IBS, and more presents ED with a chief complaint of left upper chest pain.  The chest pain started at 6:15 AM on 4 October.  She reports it was in 1 spot that she could localize with 1 finger.  She has associated left arm weakness and paresthesias.  She also had exertional dyspnea and nausea without vomiting.  Patient describes her pain as a stabbing pain the last for 20 to 30 minutes if it starts when she is at rest, and it lasts longer if it starts when she is exerting herself.  1 is longer turns into a burning pain.  She did take 2 chewable 81 aspirin at home.  She took those around 7:30 AM, and they did not improve her pain.  Patient has never had a heart attack.  patient had  pain in her parietal region of her head and left eye twitching for 1 week that she was not sure if it was associated or not.  Patient reports that she has had chest pain like this before prior to an MS flare.  Her MS flare is usually when her legs become very weak and painful.  Patient reports that she is on no MS medications.  She takes CBD oil and uses CBD lotion.  She sometimes takes [facility] powders for headaches.  Patient reports that as her MS progresses she is having dysphagia.  She is finding it more difficult to swallow pills.  Patient reports that she has not been on any long trips, she is not taking any hormones, she has not had any hemoptysis, she does not had any recent surgeries, and lastly she does not have any known malignancies.  Her mother and sister have both  had DVTs and she thinks her sister had a pulmonary embolus as well.  Patient has no personal history of blood clot.  Patient does report unilateral right lower extremity swelling that is not normal for her.  She thinks that started 1 month ago.  Patient does not smoke, does not drink, does not use illicit drugs.  She is vaccinated for COVID.  Patient is full code.  In the ED Temp 98, heart rate 86-99, respiratory rate 20-23, blood pressure 142/62 Troponin 3, 3 Leukocytosis 13.8, hemoglobin 14.7 Posterior panel is unremarkable D-dimer 0.72 CT chest shows small subsegmental pulmonary embolus in branch of left upper lobe CT head shows no no acute intracranial abnormality Chest x-ray shows no active cardiopulmonary disease EKG shows heart rate 101, QTc 697     Review of systems:    In addition to the HPI above,  No Fever-chills, Admits  to headache and jumpy eye, no change in hearing, No problems swallowing food or Liquids, Admits to chest pain, dry cough, and shortness of Breath, No Abdominal pain, No Nausea or Vomiting, bowel movements are regular, No Blood in stool or Urine, No dysuria, No new skin rashes or bruises, No new joints pains-aches,  Patient had weakness and tingling in left upper extremity and associated with chest pain, No recent weight gain or loss, No polyuria, polydypsia or polyphagia, No significant Mental Stressors.  All other systems reviewed and are negative.    Past History of the following :    Past Medical History:  Diagnosis Date   Anxiety    Asthma    Chronic headaches    IBS (irritable bowel syndrome)    MS (multiple sclerosis) (HCC)    Palpitations    Pneumonia    Rotator cuff arthropathy, left       Past Surgical History:  Procedure Laterality Date   ABDOMINAL HYSTERECTOMY     APPENDECTOMY     CESAREAN SECTION     CHOLECYSTECTOMY     plate and pin in right arm     ROTATOR CUFF REPAIR        Social History:      Social  History   Tobacco Use   Smoking status: Never   Smokeless tobacco: Never  Substance Use Topics   Alcohol use: No       Family History :     Family History  Problem Relation Age of Onset   Cancer Mother    Diabetes Mother    Diabetes type I Daughter    Other Daughter        stomach issue   Cancer Maternal Grandmother    Cancer Maternal Grandfather    Congestive Heart Failure Maternal Grandfather    Other Sister        blood clot in leg   Seizures Son       Home Medications:   Prior to Admission medications   Medication Sig Start Date End Date Taking? Authorizing Provider  acetaminophen (TYLENOL) 500 MG tablet Take 1,000 mg by mouth every 6 (six) hours as needed for headache.    [provider]  aspirin 81 MG chewable tablet Chew 324 mg by mouth daily as needed for moderate pain.    [provider]  aspirin-acetaminophen-caffeine (EXCEDRIN MIGRAINE) [phone] MG tablet Take 2 tablets by mouth every 6 (six) hours as needed for headache.    [provider]  cephALEXin (KEFLEX) 500 MG capsule Take 1 capsule (500 mg total) by mouth 4 (four) times daily. 02/15/17   Mesner, Corene Cornea, MD  ondansetron (ZOFRAN ODT) 4 MG disintegrating tablet Take 1 tablet (4 mg total) by mouth every 8 (eight) hours as needed for nausea or vomiting. 04/13/18   [place], Barbette Hair, MD  OVER THE COUNTER MEDICATION Take 1 capsule by mouth daily. Essential oil "DDR" for MS.    [provider]  oxyCODONE-acetaminophen (PERCOCET/ROXICET) 5-325 MG tablet Take 1 tablet by mouth every 6 (six) hours as needed for severe pain. 04/13/18   [place], Barbette Hair, MD  predniSONE (DELTASONE) 20 MG tablet Take 1 tablet (20 mg total) by mouth 2 (two) times daily. 03/29/18   Daleen Bo, MD  tamsulosin (FLOMAX) 0.4 MG CAPS capsule Take 1 capsule (0.4 mg total) by mouth daily. 04/13/18   [place], Barbette Hair, MD     Allergies:     Allergies  Allergen Reactions   Morphine And  Related Swelling    Penicillins Nausea And Vomiting    Has patient had a PCN reaction causing immediate rash, facial/tongue/throat swelling, SOB or lightheadedness with hypotension: No Has patient had a PCN reaction causing severe rash involving mucus membranes or skin necrosis: No Has patient had a PCN reaction that required hospitalization No Has patient had a PCN reaction occurring within the last 10 years: No If all of the above answers are "NO", then may proceed with Cephalosporin use.    Prednisone Nausea And Vomiting     Physical Exam:   Vitals  Blood pressure (!) 168/96, pulse 75, temperature 98 F (36.7 C), temperature source Oral, resp. rate 18, height 5' (1.524 m), weight 106.1 kg, SpO2 96 %.  1.  General: Patient lying supine in bed,  no acute distress   2. Psychiatric: Alert and oriented x 3, mood and behavior normal for situation, pleasant and cooperative with exam   3. Neurologic: Speech and language are normal, face is symmetric, moves all 4 extremities voluntarily, at baseline without acute deficits on limited exam   4. HEENMT:  Head is atraumatic, normocephalic, pupils reactive to light, neck is supple, trachea is midline, mucous membranes are moist   5. Respiratory : Lungs are clear to auscultation bilaterally without wheezing, rhonchi, rales, no cyanosis, no increase in work of breathing or accessory muscle use   6. Cardiovascular : Heart rate normal, rhythm is regular, no murmurs, rubs or gallops, peripheral edema present, peripheral pulses palpated   7. Gastrointestinal:  Abdomen is soft, nondistended, nontender to palpation bowel sounds active, no masses or organomegaly palpated   8. Skin:  Skin is warm, dry and intact without rashes, acute lesions, or ulcers on limited exam   9.Musculoskeletal:  No acute deformities or trauma, right lower extremity is larger than left lower extremity, no calf tenderness    Data Review:    CBC Recent Labs  Lab 08/17/21 2237  08/18/21 0429  WBC 13.8* 12.8*  HGB 14.7 13.9  HCT 44.4 41.6  PLT 411* 393  MCV 89.5 89.1  MCH 29.6 29.8  MCHC 33.1 33.4  RDW 13.8 13.9   ------------------------------------------------------------------------------------------------------------------  Results for orders placed or performed during the hospital encounter of 08/17/21 (from the past 48 hour(s))  Basic metabolic panel     Status: Abnormal   Collection Time: 08/17/21 10:37 PM  Result Value Ref Range   Sodium 139 135 - 145 mmol/L   Potassium 3.8 3.5 - 5.1 mmol/L   Chloride 104 98 - 111 mmol/L   CO2 28 22 - 32 mmol/L   Glucose, Bld 101 (H) 70 - 99 mg/dL    Comment: Glucose reference range applies only to samples taken after fasting for at least 8 hours.   BUN 9 6 - 20 mg/dL   Creatinine, Ser 0.81 0.44 - 1.00 mg/dL   Calcium 9.2 8.9 - 10.3 mg/dL   GFR, Estimated >60 >60 mL/min    Comment: (NOTE) Calculated using the CKD-EPI Creatinine Equation (2021)    Anion gap 7 5 - 15    Comment: Performed at [facility], [address], [place], [place] [address]  CBC     Status: Abnormal   Collection Time: 08/17/21 10:37 PM  Result Value Ref Range   WBC 13.8 (H) 4.0 - 10.5 K/uL   RBC 4.96 3.87 - 5.11 MIL/uL   Hemoglobin 14.7 12.0 - 15.0 g/dL   HCT 44.4 36.0 - 46.0 %   MCV 89.5 80.0 - 100.0 fL  MCH 29.6 26.0 - 34.0 pg   MCHC 33.1 30.0 - 36.0 g/dL   RDW 13.8 11.5 - 15.5 %   Platelets 411 (H) 150 - 400 K/uL   nRBC 0.0 0.0 - 0.2 %    Comment: Performed at [facility], [address], [place], [place] [address]  Troponin I (High Sensitivity)     Status: None   Collection Time: 08/17/21 10:37 PM  Result Value Ref Range   Troponin I (High Sensitivity) 3 <18 ng/L    Comment: (NOTE) Elevated high sensitivity troponin I (hsTnI) values and significant  changes across serial measurements may suggest ACS but many other  chronic and acute conditions are known to elevate hsTnI results.  Refer to the "Links" section for chest  pain algorithms and additional  guidance. Performed at [facility], [address], [place], [place] [address]   D-dimer, quantitative     Status: Abnormal   Collection Time: 08/17/21 10:37 PM  Result Value Ref Range   D-Dimer, Quant 0.72 (H) 0.00 - 0.50 ug/mL-FEU    Comment: (NOTE) At the manufacturer cut-off value of 0.5 g/mL FEU, this assay has a negative predictive value of 95-100%.This assay is intended for use in conjunction with a clinical pretest probability (PTP) assessment model to exclude pulmonary embolism (PE) and deep venous thrombosis (DVT) in outpatients suspected of PE or DVT. Results should be correlated with clinical presentation. Performed at [facility], [address] [place], [place] [address]   Troponin I (High Sensitivity)     Status: None   Collection Time: 08/18/21 12:15 AM  Result Value Ref Range   Troponin I (High Sensitivity) 3 <18 ng/L    Comment: (NOTE) Elevated high sensitivity troponin I (hsTnI) values and significant  changes across serial measurements may suggest ACS but many other  chronic and acute conditions are known to elevate hsTnI results.  Refer to the "Links" section for chest pain algorithms and additional  guidance. Performed at [facility], [address], [place], [place] [address]   POC urine preg, ED     Status: None   Collection Time: 08/18/21  1:40 AM  Result Value Ref Range   Preg Test, Ur Negative Negative  CBC     Status: Abnormal   Collection Time: 08/18/21  4:29 AM  Result Value Ref Range   WBC 12.8 (H) 4.0 - 10.5 K/uL   RBC 4.67 3.87 - 5.11 MIL/uL   Hemoglobin 13.9 12.0 - 15.0 g/dL   HCT 41.6 36.0 - 46.0 %   MCV 89.1 80.0 - 100.0 fL   MCH 29.8 26.0 - 34.0 pg   MCHC 33.4 30.0 - 36.0 g/dL   RDW 13.9 11.5 - 15.5 %   Platelets 393 150 - 400 K/uL   nRBC 0.0 0.0 - 0.2 %    Comment: Performed at [facility], [address] [place], [place] [address]    Chemistries  Recent Labs  Lab 08/17/21 2237  NA 139  K  3.8  CL 104  CO2 28  GLUCOSE 101*  BUN 9  CREATININE 0.81  CALCIUM 9.2   ------------------------------------------------------------------------------------------------------------------  ------------------------------------------------------------------------------------------------------------------ GFR: Estimated Creatinine Clearance: 83.3 mL/min (by C-G formula based on SCr of 0.81 mg/dL). Liver Function Tests: No results for input(s): AST, ALT, ALKPHOS, BILITOT, PROT, ALBUMIN in the last 168 hours. No results for input(s): LIPASE, AMYLASE in the last 168 hours. No results for input(s): AMMONIA in the last 168 hours. Coagulation Profile: No results for input(s): INR, PROTIME in  the last 168 hours. Cardiac Enzymes: No results for input(s): CKTOTAL, CKMB, CKMBINDEX, TROPONINI in the last 168 hours. BNP (last 3 results) No results for input(s): PROBNP in the last 8760 hours. HbA1C: No results for input(s): HGBA1C in the last 72 hours. CBG: No results for input(s): GLUCAP in the last 168 hours. Lipid Profile: No results for input(s): CHOL, HDL, LDLCALC, TRIG, CHOLHDL, LDLDIRECT in the last 72 hours. Thyroid Function Tests: No results for input(s): TSH, T4TOTAL, FREET4, T3FREE, THYROIDAB in the last 72 hours. Anemia Panel: No results for input(s): VITAMINB12, FOLATE, FERRITIN, TIBC, IRON, RETICCTPCT in the last 72 hours.  --------------------------------------------------------------------------------------------------------------- Urine analysis:    Component Value Date/Time   COLORURINE YELLOW 04/13/2018 0615   APPEARANCEUR HAZY (A) 04/13/2018 0615   LABSPEC 1.012 04/13/2018 0615   PHURINE 5.0 04/13/2018 0615   GLUCOSEU NEGATIVE 04/13/2018 0615   HGBUR LARGE (A) 04/13/2018 0615   BILIRUBINUR NEGATIVE 04/13/2018 0615   KETONESUR NEGATIVE 04/13/2018 0615   PROTEINUR NEGATIVE 04/13/2018 0615   UROBILINOGEN 0.2 04/29/2014 0625   NITRITE NEGATIVE 04/13/2018 0615    LEUKOCYTESUR NEGATIVE 04/13/2018 0615      Imaging Results:    DG Chest 2 View  Result Date: 08/17/2021 CLINICAL DATA:  Chest pain for few days EXAM: CHEST - 2 VIEW COMPARISON:  06/09/2019 FINDINGS: Cardiac shadow is within normal limits. Lungs are well aerated bilaterally. No focal bony abnormality is seen. IMPRESSION: No active cardiopulmonary disease. Electronically Signed   By: Inez Catalina M.D.   On: 08/17/2021 23:03   CT Head Wo Contrast  Result Date: 08/18/2021 CLINICAL DATA:  Headaches and chest pain, initial encounter EXAM: CT HEAD WITHOUT CONTRAST TECHNIQUE: Contiguous axial images were obtained from the base of the skull through the vertex without intravenous contrast. COMPARISON:  03/16/2016 FINDINGS: Brain: No evidence of acute infarction, hemorrhage, hydrocephalus, extra-axial collection or mass lesion/mass effect. Stable hypodensity is noted beneath the left thalamus unchanged from the prior exam and may be related to the patient's known chronic multiple sclerosis. No acute abnormality is noted. Vascular: No hyperdense vessel or unexpected calcification. Skull: Normal. Negative for fracture or focal lesion. Sinuses/Orbits: No acute finding. Other: Stable scalp calcification is noted in the midline anteriorly. IMPRESSION: No acute intracranial abnormality noted. Electronically Signed   By: Inez Catalina M.D.   On: 08/18/2021 00:57   CT Angio Chest PE W and/or Wo Contrast  Result Date: 08/18/2021 CLINICAL DATA:  Central chest pain radiating into the left arm for several hours, initial encounter EXAM: CT ANGIOGRAPHY CHEST WITH CONTRAST TECHNIQUE: Multidetector CT imaging of the chest was performed using the standard protocol during bolus administration of intravenous contrast. Multiplanar CT image reconstructions and MIPs were obtained to evaluate the vascular anatomy. CONTRAST:  173mL OMNIPAQUE IOHEXOL 350 MG/ML SOLN COMPARISON:  06/09/2019 FINDINGS: Cardiovascular: Thoracic aorta  demonstrates a normal branching pattern. Atherosclerotic calcifications are noted. No aneurysmal dilatation or dissection is noted. No cardiac enlargement is seen. Minimal coronary calcifications are noted. The pulmonary artery is well visualized with a normal branching pattern bilaterally. Small subsegmental left upper lobe filling defect is noted consistent with pulmonary embolus. This is best visualized on image number [id number] of series 5. No other filling defects are seen. Mediastinum/Nodes: Thoracic inlet is within normal limits. No sizable hilar or mediastinal adenopathy is noted. The esophagus as visualized is within normal limits. Lungs/Pleura: Lungs are well aerated bilaterally. No focal infiltrate or sizable effusion is seen. No pneumothorax is noted. Upper Abdomen: No acute abnormality.  Musculoskeletal: Degenerative changes of the thoracic spine are noted. Review of the MIP images confirms the above findings. IMPRESSION: Small subsegmental pulmonary embolus in a branch of the left upper lobe pulmonary artery. No other focal abnormality is noted. Aortic Atherosclerosis (ICD10-I70.0). Electronically Signed   By: Inez Catalina M.D.   On: 08/18/2021 01:03       Assessment & Plan:    Active Problems:   Pulmonary embolism (HCC)   QT prolongation   Pulmonary embolus Subsegmental in the left upper lobe No insurance -will need warfarin Heparin bridge, warfarin to be dosed by pharmacy History of blood clots, provocative factors identified Echo in the a.m. Bilateral ultrasound DVT to assess for clot burden Prolonged QT Avoid QT prolonging agents Continue to monitor electrolytes including magnesium in the a.m. Monitor on telemetry Patient takes no other medications   DVT Prophylaxis-   Heparin- SCDs   AM Labs Ordered, also please review Full Orders  Family Communication: Admission, patients condition and plan of care including tests being ordered have been discussed with the patient and  husband, Meda Coffee who indicate understanding and agree with the plan and Code Status.  Code Status: Full  Admission status: Observation Time spent in minutes : [place]

## 2021-08-18 NOTE — Progress Notes (Signed)
ANTICOAGULATION CONSULT NOTE -  Pharmacy Consult for Heparin=> Apixaban Indication: pulmonary embolus  Allergies  Allergen Reactions   Morphine And Related Swelling   Penicillins Nausea And Vomiting    Has patient had a PCN reaction causing immediate rash, facial/tongue/throat swelling, SOB or lightheadedness with hypotension: No Has patient had a PCN reaction causing severe rash involving mucus membranes or skin necrosis: No Has patient had a PCN reaction that required hospitalization No Has patient had a PCN reaction occurring within the last 10 years: No If all of the above answers are "NO", then may proceed with Cephalosporin use.    Prednisone Nausea And Vomiting    Patient Measurements: Height: 5' (152.4 cm) Weight: 106.1 kg (234 lb) IBW/kg (Calculated) : 45.5 Heparin Dosing Weight: 72 kg  Vital Signs: Temp: 98.3 F (36.8 C) (10/05 0719) Temp Source: Oral (10/05 0719) BP: 151/71 (10/05 0719) Pulse Rate: 71 (10/05 0719)  Labs: Recent Labs    08/17/21 2237 08/18/21 0015 08/18/21 0429 08/18/21 0801  HGB 14.7  --  13.9  --   HCT 44.4  --  41.6  --   PLT 411*  --  393  --   LABPROT  --   --   --  13.7  INR  --   --   --  1.1  HEPARINUNFRC  --   --   --  >1.10*  CREATININE 0.81  --  0.80  0.82  --   TROPONINIHS 3 3  --   --      Estimated Creatinine Clearance: 84.3 mL/min (by C-G formula based on SCr of 0.8 mg/dL).   Medical History: Past Medical History:  Diagnosis Date   Anxiety    Asthma    Chronic headaches    IBS (irritable bowel syndrome)    MS (multiple sclerosis) (HCC)    Palpitations    Pneumonia    Rotator cuff arthropathy, left     Medications:  See electronic med rec  Assessment: 60 y.o. F presents with SOB and CP. Chest CT shows small subsegmental pulmonary embolus in a branch of the left upper lobe pulmonary artery. To begin heparin per pharmacy. No AC PTA. CBC ok on admission. HL 1.1, supratherapeutic. MD requesting change to  apixaban now  Goal of Therapy:  Heparin level 0.3-0.7 units/ml Monitor platelets by anticoagulation protocol: Yes   Plan:  D/C heparin Apixaban 10mg  po BID x 7 days, then 5mg  po bid Educate on apixaban Monitor for S/S of bleeding  Isac Sarna, BS Vena [place], BCPS Clinical Pharmacist Pager [phone] 08/18/2021,8:44 AM

## 2021-08-18 NOTE — Progress Notes (Signed)
PROGRESS NOTE    Patient: Kristine Miller                            PCP: Kathyrn Drown, MD                    DOB: [birth year]            DOA: 08/17/2021 [id number]             DOS: 08/18/2021, 9:58 AM   LOS: 0 days   Date of Service: The patient was seen and examined on 08/18/2021  Subjective:   The patient was seen and examined this morning. Stable at this time. Still complaining of :  Otherwise no issues overnight .  Brief Narrative:   Kristine Miller  is a 60 y.o. female, with history of Multiple Sclerosis, anxiety, asthma, IBS, and more presents ED with a chief complaint of left upper chest pain.  The chest pain started at 6:15 AM on 4 October.  She reports it was in 1 spot that she could localize with 1 finger.  She has associated left arm weakness and paresthesias.  She also had exertional dyspnea and nausea without vomiting.  Patient describes her pain as a stabbing pain the last for 20 to 30 minutes if it starts when she is at rest, and it lasts longer if it starts when she is exerting herself.  1 is longer turns into a burning pain.  She did take 2 chewable 81 aspirin at home  ED Temp 98, heart rate 86-99, respiratory rate 20-23, blood pressure 142/62 Troponin 3, 3 Leukocytosis 13.8, hemoglobin 14.7 Posterior panel is unremarkable D-dimer 0.72 CT chest shows small subsegmental pulmonary embolus in branch of left upper lobe CT head shows no no acute intracranial abnormality Chest x-ray shows no active cardiopulmonary disease EKG shows heart rate 101, QTc 697 Assessment & Plan:   Active Problems:   Pulmonary embolism (HCC)   QT prolongation  Pulmonary embolus Subsegmental in the left upper lobe No insurance -determining oral anticoagulation-pharmacy and social worker consulted Transitioning from heparin to Eliquis for now History of blood clots, provocative factors identified -need to follow with hematology as an outpatient Echo  >>> Bilateral ultrasound DVT to  assess for clot burden >>>  Prolonged QT Avoid QT prolonging agents Continue to monitor electrolytes including magnesium in the a.m. Monitor on telemetry Repleting electrolytes  Hypokalemia -repleting p.o., checking magnesium  Hypertensive - as needed hydralazine -we will evaluate for initiation of BP meds upon discharge ... Initiating Norvasc today      Consultants: Pharmacy and TOC    ---------------------------------------------------------------------------------------------------------------------------------  DVT prophylaxis: Heparin drip with anticipation of switching to Eliquis  Code Status:   Code Status: Full Code  Family Communication: No family member present at bedside- attempt will be made to update daily The above findings and plan of care has been discussed with patient (and family)  in detail,  they expressed understanding and agreement of above. -Advance care planning has been discussed.   Admission status:   Status is: Observation  The patient remains OBS appropriate and will d/c before 2 midnights.  Dispo: The patient is from: Home              Anticipated d/c is to: Home in AM               Patient currently is not medically stable to d/c.  Pending 2D  echocardiogram, lower extremity ultrasound, switching from IV to p.o. anticoagulant therapy   Difficult to place patient No      Level of care: Telemetry   Procedures:   No admission procedures for hospital encounter.    Antimicrobials:  Anti-infectives (From admission, onward)    None        Medication:   apixaban  10 mg Oral BID   Followed by   Derrill Memo ON 08/25/2021] apixaban  5 mg Oral BID   tamsulosin  0.4 mg Oral Daily    acetaminophen, oxyCODONE-acetaminophen, prochlorperazine   Objective:   Vitals:   08/18/21 0300 08/18/21 0400 08/18/21 0719 08/18/21 0900  BP: (!) 173/73 (!) 168/96 (!) 151/71 (!) 163/82  Pulse: 77 75 71 72  Resp: 15 18 19 13   Temp:   98.3 F (36.8  C)   TempSrc:   Oral   SpO2: 94% 96% 97% 97%  Weight:      Height:        Intake/Output Summary (Last 24 hours) at 08/18/2021 7782 Last data filed at 08/18/2021 4235 Gross per 24 hour  Intake 157.65 ml  Output --  Net 157.65 ml   Filed Weights   08/17/21 2220 08/18/21 0139  Weight: 106.1 kg 106.1 kg     Examination:   Physical Exam  Constitution:  Alert, cooperative, no distress,  Appears calm and comfortable  Psychiatric: Normal and stable mood and affect, cognition intact,   HEENT: Normocephalic, PERRL, otherwise with in Normal limits  Chest:Chest symmetric Cardio vascular:  S1/S2, RRR, No murmure, No Rubs or Gallops  pulmonary: Clear to auscultation bilaterally, respirations unlabored, negative wheezes / crackles Abdomen: Soft, non-tender, non-distended, bowel sounds,no masses, no organomegaly Muscular skeletal: Limited exam - in bed, able to move all 4 extremities, Normal strength,  Neuro: CNII-XII intact. , normal motor and sensation, reflexes intact  Extremities: No pitting edema lower extremities, +2 pulses  Skin: Dry, warm to touch, negative for any Rashes, No open wounds Wounds: per nursing documentation    ------------------------------------------------------------------------------------------------------------------------------------------    LABs:  CBC Latest Ref Rng & Units 08/18/2021 08/17/2021 06/09/2019  WBC 4.0 - 10.5 K/uL 12.8(H) 13.8(H) 10.6(H)  Hemoglobin 12.0 - 15.0 g/dL 13.9 14.7 15.1(H)  Hematocrit 36.0 - 46.0 % 41.6 44.4 47.1(H)  Platelets 150 - 400 K/uL 393 411(H) 380   CMP Latest Ref Rng & Units 08/18/2021 08/18/2021 08/17/2021  Glucose 70 - 99 mg/dL 105(H) 106(H) 101(H)  BUN 6 - 20 mg/dL 10 10 9   Creatinine 0.44 - 1.00 mg/dL 0.80 0.82 0.81  Sodium 135 - 145 mmol/L 139 139 139  Potassium 3.5 - 5.1 mmol/L 3.5 3.4(L) 3.8  Chloride 98 - 111 mmol/L 106 106 104  CO2 22 - 32 mmol/L 26 26 28   Calcium 8.9 - 10.3 mg/dL 9.0 9.0 9.2  Total Protein 6.5  - 8.1 g/dL 6.7 6.8 -  Total Bilirubin 0.3 - 1.2 mg/dL 0.6 0.6 -  Alkaline Phos 38 - 126 U/L 77 76 -  AST 15 - 41 U/L 15 14(L) -  ALT 0 - 44 U/L 14 15 -       Micro Results Recent Results (from the past 240 hour(s))  Resp Panel by RT-PCR (Flu A&B, Covid) Nasopharyngeal Swab     Status: None   Collection Time: 08/18/21  3:55 AM   Specimen: Nasopharyngeal Swab; Nasopharyngeal(NP) swabs in vial transport medium  Result Value Ref Range Status   SARS Coronavirus 2 by RT PCR NEGATIVE NEGATIVE Final  Comment: (NOTE) SARS-CoV-2 target nucleic acids are NOT DETECTED.  The SARS-CoV-2 RNA is generally detectable in upper respiratory specimens during the acute phase of infection. The lowest concentration of SARS-CoV-2 viral copies this assay can detect is 138 copies/mL. A negative result does not preclude SARS-Cov-2 infection and should not be used as the sole basis for treatment or other patient management decisions. A negative result may occur with  improper specimen collection/handling, submission of specimen other than nasopharyngeal swab, presence of viral mutation(s) within the areas targeted by this assay, and inadequate number of viral copies(<138 copies/mL). A negative result must be combined with clinical observations, patient history, and epidemiological information. The expected result is Negative.  Fact Sheet for Patients:  EntrepreneurPulse.com.au  Fact Sheet for Healthcare Providers:  IncredibleEmployment.be  This test is no t yet approved or cleared by the [place] FDA and  has been authorized for detection and/or diagnosis of SARS-CoV-2 by FDA under an Emergency Use Authorization (EUA). This EUA will remain  in effect (meaning this test can be used) for the duration of the COVID-19 declaration under Section 564(b)(1) of the Act, 21 U.S.C.section 360bbb-3(b)(1), unless the authorization is terminated  or revoked sooner.        Influenza A by PCR NEGATIVE NEGATIVE Final   Influenza B by PCR NEGATIVE NEGATIVE Final    Comment: (NOTE) The Xpert Xpress SARS-CoV-2/FLU/RSV plus assay is intended as an aid in the diagnosis of influenza from Nasopharyngeal swab specimens and should not be used as a sole basis for treatment. Nasal washings and aspirates are unacceptable for Xpert Xpress SARS-CoV-2/FLU/RSV testing.  Fact Sheet for Patients: EntrepreneurPulse.com.au  Fact Sheet for Healthcare Providers: IncredibleEmployment.be  This test is not yet approved or cleared by the [place] FDA and has been authorized for detection and/or diagnosis of SARS-CoV-2 by FDA under an Emergency Use Authorization (EUA). This EUA will remain in effect (meaning this test can be used) for the duration of the COVID-19 declaration under Section 564(b)(1) of the Act, 21 U.S.C. section 360bbb-3(b)(1), unless the authorization is terminated or revoked.  Performed at [facility], [address], [place], [place] [address]     Radiology Reports DG Chest 2 View  Result Date: 08/17/2021 CLINICAL DATA:  Chest pain for few days EXAM: CHEST - 2 VIEW COMPARISON:  06/09/2019 FINDINGS: Cardiac shadow is within normal limits. Lungs are well aerated bilaterally. No focal bony abnormality is seen. IMPRESSION: No active cardiopulmonary disease. Electronically Signed   By: Inez Catalina M.D.   On: 08/17/2021 23:03   CT Head Wo Contrast  Result Date: 08/18/2021 CLINICAL DATA:  Headaches and chest pain, initial encounter EXAM: CT HEAD WITHOUT CONTRAST TECHNIQUE: Contiguous axial images were obtained from the base of the skull through the vertex without intravenous contrast. COMPARISON:  03/16/2016 FINDINGS: Brain: No evidence of acute infarction, hemorrhage, hydrocephalus, extra-axial collection or mass lesion/mass effect. Stable hypodensity is noted beneath the left thalamus unchanged from the prior exam and may  be related to the patient's known chronic multiple sclerosis. No acute abnormality is noted. Vascular: No hyperdense vessel or unexpected calcification. Skull: Normal. Negative for fracture or focal lesion. Sinuses/Orbits: No acute finding. Other: Stable scalp calcification is noted in the midline anteriorly. IMPRESSION: No acute intracranial abnormality noted. Electronically Signed   By: Inez Catalina M.D.   On: 08/18/2021 00:57   CT Angio Chest PE W and/or Wo Contrast  Result Date: 08/18/2021 CLINICAL DATA:  Central chest pain radiating into the left arm for  several hours, initial encounter EXAM: CT ANGIOGRAPHY CHEST WITH CONTRAST TECHNIQUE: Multidetector CT imaging of the chest was performed using the standard protocol during bolus administration of intravenous contrast. Multiplanar CT image reconstructions and MIPs were obtained to evaluate the vascular anatomy. CONTRAST:  154mL OMNIPAQUE IOHEXOL 350 MG/ML SOLN COMPARISON:  06/09/2019 FINDINGS: Cardiovascular: Thoracic aorta demonstrates a normal branching pattern. Atherosclerotic calcifications are noted. No aneurysmal dilatation or dissection is noted. No cardiac enlargement is seen. Minimal coronary calcifications are noted. The pulmonary artery is well visualized with a normal branching pattern bilaterally. Small subsegmental left upper lobe filling defect is noted consistent with pulmonary embolus. This is best visualized on image number [id number] of series 5. No other filling defects are seen. Mediastinum/Nodes: Thoracic inlet is within normal limits. No sizable hilar or mediastinal adenopathy is noted. The esophagus as visualized is within normal limits. Lungs/Pleura: Lungs are well aerated bilaterally. No focal infiltrate or sizable effusion is seen. No pneumothorax is noted. Upper Abdomen: No acute abnormality. Musculoskeletal: Degenerative changes of the thoracic spine are noted. Review of the MIP images confirms the above findings. IMPRESSION: Small  subsegmental pulmonary embolus in a branch of the left upper lobe pulmonary artery. No other focal abnormality is noted. Aortic Atherosclerosis (ICD10-I70.0). Electronically Signed   By: Inez Catalina M.D.   On: 08/18/2021 01:03    SIGNED: Deatra James, MD, FHM. [facility],  Pager (please use [email or web address] to page/text) Please use Epic Secure Chat for non-urgent communication (7AM-7PM)  If 7PM-7AM, please contact night-coverage [email or web address], 08/18/2021, 9:58 AM

## 2021-08-18 NOTE — ED Notes (Signed)
Heparin drip infusing well at 1300 units/hr.

## 2021-08-18 NOTE — Discharge Summary (Addendum)
Physician Discharge Summary [facility]    Patient: Kristine Miller                   Admit date: 08/17/2021   DOB: Oct 24, 1961             Discharge date:08/18/2021/11:33 AM OZD:[id number]                          PCP: Kathyrn Drown, MD  Disposition: HOME   Recommendations for Outpatient Follow-up:   Follow up -get established with PCP ASAP, needs a referral to hematologist Continue currently prescribed anticoagulation therapy with Eliquis we anticipate minimum of 6 months of treatment, till evaluated by hematologist for possible underlying coagulation disorder. Newly prescribed BP meds, Norvasc, to monitor blood pressure daily with log presented to PCP for further adjustment of medication  Discharge Condition: Stable   Code Status:   Code Status: Full Code  Diet recommendation: Cardiac diet   Discharge Diagnoses:    Active Problems:   Pulmonary embolism (HCC)   QT prolongation   History of Present Illness/ Hospital Course Kristine Miller Summary:    Kristine Miller  is a 60 y.o. female, with history of Multiple Sclerosis, anxiety, asthma, IBS, and more presents ED with a chief complaint of left upper chest pain.  The chest pain started at 6:15 AM on 4 October.  She reports it was in 1 spot that she could localize with 1 finger.  She has associated left arm weakness and paresthesias.  She also had exertional dyspnea and nausea without vomiting.  Patient describes her pain as a stabbing pain the last for 20 to 30 minutes if it starts when she is at rest, and it lasts longer if it starts when she is exerting herself.  1 is longer turns into a burning pain.  She did take 2 chewable 81 aspirin at home   ED Temp 98, heart rate 86-99, respiratory rate 20-23, blood pressure 142/62 Troponin 3, 3 Leukocytosis 13.8, hemoglobin 14.7 Posterior panel is unremarkable D-dimer 0.72 CT chest shows small subsegmental pulmonary embolus in branch of left upper lobe CT head shows no no acute  intracranial abnormality Chest x-ray shows no active cardiopulmonary disease EKG shows heart rate 101, QTc 697 Assessment & Plan:    Active Problems:   Pulmonary embolism (HCC)   QT prolongation   Pulmonary embolus Subsegmental in the left upper lobe Was on heparin drip-was transitioned to p.o. Eliquis TOC and pharmacist has been obtained coupons, assistance for patient to to acquire Eliquis, prescription was given History of blood clots, provocative factors identified -need to follow with hematology as an outpatient Echo  >>> reviewed negative for any right heart strain: ejection fraction, 55 to 60%. Bilateral ultrasound DVT to assess for clot burden >>> negative for any DVT Prolonged QT Avoid QT prolonging agents Continue to monitor electrolytes including magnesium in the a.m. Monitor on telemetry Repleting electrolytes Resolved   Hypokalemia -repleting p.o., magnesium within normal limits   Hypertensive -initiated Norvasc  Consultants: Pharmacy and TOC : Who has not provided the patient with coupon, system to obtain Eliquis  ... Prescription was given     --------------------------------------------------------------------------------------------------------------------------------   Code Status:   Code Status: Full Code   Family Communication: No family member present at bedside- Awake alert oriented x4-discussed with patient. -Advance care planning has been discussed.    Admission status:  Anticipated d/c is to: Home in AM     Discharge Instructions:   Discharge Instructions     Activity as tolerated - No restrictions   Complete by: As directed    Call MD for:  difficulty breathing, headache or visual disturbances   Complete by: As directed    Diet - low sodium heart healthy   Complete by: As directed    Discharge instructions   Complete by: As directed    Please establish and follow-up with PCP with referral to hematologist in near future.   Continue currently prescribed recommended medication of Eliquis   Increase activity slowly   Complete by: As directed         Medication List     STOP taking these medications    aspirin 81 MG chewable tablet   cephALEXin 500 MG capsule Commonly known as: KEFLEX   ondansetron 4 MG disintegrating tablet Commonly known as: Zofran ODT   OVER THE COUNTER MEDICATION   oxyCODONE-acetaminophen 5-325 MG tablet Commonly known as: PERCOCET/ROXICET   predniSONE 20 MG tablet Commonly known as: DELTASONE       TAKE these medications    amLODipine 5 MG tablet Commonly known as: NORVASC Take 1 tablet (5 mg total) by mouth daily.   apixaban 5 MG Tabs tablet Commonly known as: ELIQUIS Take 2 tablets (10 mg total) by mouth 2 (two) times daily for 7 days.   apixaban 5 MG Tabs tablet Commonly known as: ELIQUIS Take 1 tablet (5 mg total) by mouth 2 (two) times daily. Start taking on: August 25, 2021   [facility] HEADACHE POWDER PO Take 1 packet by mouth daily as needed (Headache).   NON FORMULARY Apply 1 application topically daily as needed (affected area/pain). CBD oil   tamsulosin 0.4 MG Caps capsule Commonly known as: Flomax Take 1 capsule (0.4 mg total) by mouth daily.        Allergies  Allergen Reactions   Morphine And Related Swelling   Penicillins Nausea And Vomiting    Has patient had a PCN reaction causing immediate rash, facial/tongue/throat swelling, SOB or lightheadedness with hypotension: No Has patient had a PCN reaction causing severe rash involving mucus membranes or skin necrosis: No Has patient had a PCN reaction that required hospitalization No Has patient had a PCN reaction occurring within the last 10 years: No If all of the above answers are "NO", then may proceed with Cephalosporin use.    Prednisone Nausea And Vomiting     Procedures /Studies:   DG Chest 2 View  Result Date: 08/17/2021 CLINICAL DATA:  Chest pain for few days EXAM: CHEST - 2  VIEW COMPARISON:  06/09/2019 FINDINGS: Cardiac shadow is within normal limits. Lungs are well aerated bilaterally. No focal bony abnormality is seen. IMPRESSION: No active cardiopulmonary disease. Electronically Signed   By: Inez Catalina M.D.   On: 08/17/2021 23:03   CT Head Wo Contrast  Result Date: 08/18/2021 CLINICAL DATA:  Headaches and chest pain, initial encounter EXAM: CT HEAD WITHOUT CONTRAST TECHNIQUE: Contiguous axial images were obtained from the base of the skull through the vertex without intravenous contrast. COMPARISON:  03/16/2016 FINDINGS: Brain: No evidence of acute infarction, hemorrhage, hydrocephalus, extra-axial collection or mass lesion/mass effect. Stable hypodensity is noted beneath the left thalamus unchanged from the prior exam and may be related to the patient's known chronic multiple sclerosis. No acute abnormality is noted. Vascular: No hyperdense vessel or unexpected calcification. Skull: Normal. Negative for fracture or focal lesion. Sinuses/Orbits:  No acute finding. Other: Stable scalp calcification is noted in the midline anteriorly. IMPRESSION: No acute intracranial abnormality noted. Electronically Signed   By: Inez Catalina M.D.   On: 08/18/2021 00:57   CT Angio Chest PE W and/or Wo Contrast  Result Date: 08/18/2021 CLINICAL DATA:  Central chest pain radiating into the left arm for several hours, initial encounter EXAM: CT ANGIOGRAPHY CHEST WITH CONTRAST TECHNIQUE: Multidetector CT imaging of the chest was performed using the standard protocol during bolus administration of intravenous contrast. Multiplanar CT image reconstructions and MIPs were obtained to evaluate the vascular anatomy. CONTRAST:  181mL OMNIPAQUE IOHEXOL 350 MG/ML SOLN COMPARISON:  06/09/2019 FINDINGS: Cardiovascular: Thoracic aorta demonstrates a normal branching pattern. Atherosclerotic calcifications are noted. No aneurysmal dilatation or dissection is noted. No cardiac enlargement is seen. Minimal  coronary calcifications are noted. The pulmonary artery is well visualized with a normal branching pattern bilaterally. Small subsegmental left upper lobe filling defect is noted consistent with pulmonary embolus. This is best visualized on image number 101 [id number] of series 5. No other filling defects are seen. Mediastinum/Nodes: Thoracic inlet is within normal limits. No sizable hilar or mediastinal adenopathy is noted. The esophagus as visualized is within normal limits. Lungs/Pleura: Lungs are well aerated bilaterally. No focal infiltrate or sizable effusion is seen. No pneumothorax is noted. Upper Abdomen: No acute abnormality. Musculoskeletal: Degenerative changes of the thoracic spine are noted. Review of the MIP images confirms the above findings. IMPRESSION: Small subsegmental pulmonary embolus in a branch of the left upper lobe pulmonary artery. No other focal abnormality is noted. Aortic Atherosclerosis (ICD10-I70.0). Electronically Signed   By: Inez Catalina M.D.   On: 08/18/2021 01:03   US Venous Img Lower Bilateral (DVT)  Result Date: 08/18/2021 EXAM: BILATERAL LOWER EXTREMITY VENOUS DOPPLER ULTRASOUND TECHNIQUE: Gray-scale sonography with graded compression, as well as color Doppler and duplex ultrasound were performed to evaluate the lower extremity deep venous systems from the level of the common femoral vein and including the common femoral, femoral, profunda femoral, popliteal and calf veins including the posterior tibial, peroneal and gastrocnemius veins when visible. The superficial great saphenous vein was also interrogated. Spectral Doppler was utilized to evaluate flow at rest and with distal augmentation maneuvers in the common femoral, femoral and popliteal veins. COMPARISON:  None. FINDINGS: RIGHT LOWER EXTREMITY Common Femoral Vein: No evidence of thrombus. Normal compressibility, respiratory phasicity and response to augmentation. Saphenofemoral Junction: No evidence of thrombus. Normal  compressibility and flow on color Doppler imaging. Profunda Femoral Vein: No evidence of thrombus. Normal compressibility and flow on color Doppler imaging. Femoral Vein: No evidence of thrombus. Normal compressibility, respiratory phasicity and response to augmentation. Popliteal Vein: No evidence of thrombus. Normal compressibility, respiratory phasicity and response to augmentation. Calf Veins: No evidence of thrombus. Normal compressibility and flow on color Doppler imaging. Superficial Great Saphenous Vein: No evidence of thrombus. Normal compressibility. LEFT LOWER EXTREMITY Common Femoral Vein: No evidence of thrombus. Normal compressibility, respiratory phasicity and response to augmentation. Saphenofemoral Junction: No evidence of thrombus. Normal compressibility and flow on color Doppler imaging. Profunda Femoral Vein: No evidence of thrombus. Normal compressibility and flow on color Doppler imaging. Femoral Vein: No evidence of thrombus. Normal compressibility, respiratory phasicity and response to augmentation. Popliteal Vein: No evidence of thrombus. Normal compressibility, respiratory phasicity and response to augmentation. Calf Veins: No evidence of thrombus. Normal compressibility and flow on color Doppler imaging. Superficial Great Saphenous Vein: No evidence of thrombus. Normal compressibility. Other Findings:  None. IMPRESSION:  No evidence of deep venous thrombosis in either lower extremity. Electronically Signed   By: Albin Felling M.D.   On: 08/18/2021 11:10    Subjective:   Patient was seen and examined 08/18/2021, 11:33 AM Patient stable today. No acute distress.  No issues overnight Stable for discharge.  Discharge Exam:    Vitals:   08/18/21 0900 08/18/21 1000 08/18/21 1030 08/18/21 1100  BP: (!) 163/82 135/64 132/82 140/73  Pulse: 72 64 68 64  Resp: 13 19 16 20   Temp:      TempSrc:      SpO2: 97% 95% 98% 95%  Weight:      Height:        General: Pt lying comfortably  in bed & appears in no obvious distress. Cardiovascular: S1 & S2 heard, RRR, S1/S2 +. No murmurs, rubs, gallops or clicks. No JVD or pedal edema. Respiratory: Clear to auscultation without wheezing, rhonchi or crackles. No increased work of breathing. Abdominal:  Non-distended, non-tender & soft. No organomegaly or masses appreciated. Normal bowel sounds heard. CNS: Alert and oriented. No focal deficits. Extremities: no edema, no cyanosis      The results of significant diagnostics from this hospitalization (including imaging, microbiology, ancillary and laboratory) are listed below for reference.      Microbiology:   Recent Results (from the past 240 hour(s))  Resp Panel by RT-PCR (Flu A&B, Covid) Nasopharyngeal Swab     Status: None   Collection Time: 08/18/21  3:55 AM   Specimen: Nasopharyngeal Swab; Nasopharyngeal(NP) swabs in vial transport medium  Result Value Ref Range Status   SARS Coronavirus 2 by RT PCR NEGATIVE NEGATIVE Final    Comment: (NOTE) SARS-CoV-2 target nucleic acids are NOT DETECTED.  The SARS-CoV-2 RNA is generally detectable in upper respiratory specimens during the acute phase of infection. The lowest concentration of SARS-CoV-2 viral copies this assay can detect is 138 copies/mL. A negative result does not preclude SARS-Cov-2 infection and should not be used as the sole basis for treatment or other patient management decisions. A negative result may occur with  improper specimen collection/handling, submission of specimen other than nasopharyngeal swab, presence of viral mutation(s) within the areas targeted by this assay, and inadequate number of viral copies(<138 copies/mL). A negative result must be combined with clinical observations, patient history, and epidemiological information. The expected result is Negative.  Fact Sheet for Patients:  EntrepreneurPulse.com.au  Fact Sheet for Healthcare Providers:   IncredibleEmployment.be  This test is no t yet approved or cleared by the [place] FDA and  has been authorized for detection and/or diagnosis of SARS-CoV-2 by FDA under an Emergency Use Authorization (EUA). This EUA will remain  in effect (meaning this test can be used) for the duration of the COVID-19 declaration under Section 564(b)(1) of the Act, 21 U.S.C.section 360bbb-3(b)(1), unless the authorization is terminated  or revoked sooner.       Influenza A by PCR NEGATIVE NEGATIVE Final   Influenza B by PCR NEGATIVE NEGATIVE Final    Comment: (NOTE) The Xpert Xpress SARS-CoV-2/FLU/RSV plus assay is intended as an aid in the diagnosis of influenza from Nasopharyngeal swab specimens and should not be used as a sole basis for treatment. Nasal washings and aspirates are unacceptable for Xpert Xpress SARS-CoV-2/FLU/RSV testing.  Fact Sheet for Patients: EntrepreneurPulse.com.au  Fact Sheet for Healthcare Providers: IncredibleEmployment.be  This test is not yet approved or cleared by the [place] FDA and has been authorized for detection and/or diagnosis of SARS-CoV-2 by  FDA under an Emergency Use Authorization (EUA). This EUA will remain in effect (meaning this test can be used) for the duration of the COVID-19 declaration under Section 564(b)(1) of the Act, 21 U.S.C. section 360bbb-3(b)(1), unless the authorization is terminated or revoked.  Performed at [facility], [address], [place], [place] [address]      Labs:   CBC: Recent Labs  Lab 08/17/21 2237 08/18/21 0429  WBC 13.8* 12.8*  HGB 14.7 13.9  HCT 44.4 41.6  MCV 89.5 89.1  PLT 411* 740   Basic Metabolic Panel: Recent Labs  Lab 08/17/21 2237 08/18/21 0429  NA 139 139  139  K 3.8 3.5  3.4*  CL 104 106  106  CO2 28 26  26   GLUCOSE 101* 105*  106*  BUN 9 10  10   CREATININE 0.81 0.80  0.82  CALCIUM 9.2 9.0  9.0  MG  --  2.0    Liver Function Tests: Recent Labs  Lab 08/18/21 0429  AST 15  14*  ALT 14  15  ALKPHOS 77  76  BILITOT 0.6  0.6  PROT 6.7  6.8  ALBUMIN 3.4*  3.4*    Urinalysis    Component Value Date/Time   COLORURINE YELLOW 04/13/2018 0615   APPEARANCEUR HAZY (A) 04/13/2018 0615   LABSPEC 1.012 04/13/2018 0615   PHURINE 5.0 04/13/2018 0615   GLUCOSEU NEGATIVE 04/13/2018 0615   HGBUR LARGE (A) 04/13/2018 0615   BILIRUBINUR NEGATIVE 04/13/2018 0615   KETONESUR NEGATIVE 04/13/2018 0615   PROTEINUR NEGATIVE 04/13/2018 0615   UROBILINOGEN 0.2 04/29/2014 0625   NITRITE NEGATIVE 04/13/2018 0615   LEUKOCYTESUR NEGATIVE 04/13/2018 0615         Time coordinating discharge: Over 29 minutes  SIGNED: Deatra James, MD, FACP, FHM. [facility],  Please use [email or web address] to Page If 7PM-7AM, please contact night-coverage [email or web address] [email or web address] [facility] 08/18/2021, 11:33 AM

## 2021-08-21 ENCOUNTER — Emergency Department (HOSPITAL_COMMUNITY): Payer: Self-pay

## 2021-08-21 ENCOUNTER — Emergency Department (HOSPITAL_COMMUNITY)
Admission: EM | Admit: 2021-08-21 | Discharge: 2021-08-22 | Disposition: A | Discharge status: Home / Self Care | Payer: Self-pay | Attending: Emergency Medicine | Admitting: Emergency Medicine

## 2021-08-21 ENCOUNTER — Other Ambulatory Visit: Payer: Self-pay

## 2021-08-21 DIAGNOSIS — R0602 Shortness of breath: Secondary | ICD-10-CM | POA: Insufficient documentation

## 2021-08-21 DIAGNOSIS — M79669 Pain in unspecified lower leg: Secondary | ICD-10-CM | POA: Insufficient documentation

## 2021-08-21 DIAGNOSIS — R079 Chest pain, unspecified: Secondary | ICD-10-CM | POA: Insufficient documentation

## 2021-08-21 DIAGNOSIS — Z5321 Procedure and treatment not carried out due to patient leaving prior to being seen by health care provider: Secondary | ICD-10-CM | POA: Insufficient documentation

## 2021-08-21 LAB — CBC WITH DIFFERENTIAL/PLATELET
Abs Immature Granulocytes: 0.04 10*3/uL (ref 0.00–0.07)
Basophils Absolute: 0.1 10*3/uL (ref 0.0–0.1)
Basophils Relative: 0 %
Eosinophils Absolute: 0.1 10*3/uL (ref 0.0–0.5)
Eosinophils Relative: 1 %
HCT: 46.7 % — ABNORMAL HIGH (ref 36.0–46.0)
Hemoglobin: 15.4 g/dL — ABNORMAL HIGH (ref 12.0–15.0)
Immature Granulocytes: 0 %
Lymphocytes Relative: 26 %
Lymphs Abs: 3.4 10*3/uL (ref 0.7–4.0)
MCH: 28.7 pg (ref 26.0–34.0)
MCHC: 33 g/dL (ref 30.0–36.0)
MCV: 87.1 fL (ref 80.0–100.0)
Monocytes Absolute: 0.9 10*3/uL (ref 0.1–1.0)
Monocytes Relative: 7 %
Neutro Abs: 8.9 10*3/uL — ABNORMAL HIGH (ref 1.7–7.7)
Neutrophils Relative %: 66 %
Platelets: 406 10*3/uL — ABNORMAL HIGH (ref 150–400)
RBC: 5.36 MIL/uL — ABNORMAL HIGH (ref 3.87–5.11)
RDW: 13.6 % (ref 11.5–15.5)
WBC: 13.4 10*3/uL — ABNORMAL HIGH (ref 4.0–10.5)
nRBC: 0 % (ref 0.0–0.2)

## 2021-08-21 LAB — BASIC METABOLIC PANEL
Anion gap: 9 (ref 5–15)
BUN: 8 mg/dL (ref 6–20)
CO2: 27 mmol/L (ref 22–32)
Calcium: 9.2 mg/dL (ref 8.9–10.3)
Chloride: 104 mmol/L (ref 98–111)
Creatinine, Ser: 0.9 mg/dL (ref 0.44–1.00)
GFR, Estimated: >60 mL/min (ref >60)
Glucose, Bld: 104 mg/dL — ABNORMAL HIGH (ref 70–99)
Potassium: 4.1 mmol/L (ref 3.5–5.1)
Sodium: 140 mmol/L (ref 135–145)

## 2021-08-21 LAB — TROPONIN I (HIGH SENSITIVITY): Troponin I (High Sensitivity): 4 ng/L (ref <18)

## 2021-08-21 NOTE — ED Triage Notes (Signed)
Pt states visit to [facility] on 10/4 and was diagnosed with PE. C/o to experience SOB, chest pain on left side, along with left arm tingling and weakness. Symptoms are the same and ongoing since Tuesday. Started eliquis on Wednesday and states compliance.

## 2021-08-21 NOTE — ED Provider Notes (Signed)
Emergency Medicine Provider Triage Evaluation Note  Kristine Miller , a 60 y.o. female  was evaluated in triage.  Pt complains of chest pain.  Was seen at [facility] earlier this week diagnosed with pulmonary embolism.  Was hospitalized overnight and subsequently discharged on Eliquis.  She has been compliant with her Eliquis.  Has not missed any doses.  Chest pain not necessarily worse however not improving as well.  No lightheadedness, dizziness or syncope.  She still has some shortness of breath with ambulation.  Some generalized leg pain however this has been constant since prior admission, states had ultrasounds which were negative at that time for DVT  Review of Systems  Positive: Chest pain, shortness of breath Negative: Syncope, fever  Physical Exam  There were no vitals taken for this visit. Gen:   Awake, no distress   Resp:  Normal effort  MSK:   Moves extremities without difficulty  Other:    Medical Decision Making  Medically screening exam initiated at 10:08 PM.  Appropriate orders placed.  MALENI SEYER was informed that the remainder of the evaluation will be completed by another provider, this initial triage assessment does not replace that evaluation, and the importance of remaining in the ED until their evaluation is complete.  CP, SOB   Ellis Mehaffey A, PA-C 08/21/21 2213    Isla Pence, MD 08/21/21 2329

## 2021-08-22 LAB — TROPONIN I (HIGH SENSITIVITY): Troponin I (High Sensitivity): 7 ng/L (ref <18)

## 2021-08-22 NOTE — ED Notes (Signed)
Pt left. 

## 2021-09-08 ENCOUNTER — Telehealth: Payer: Self-pay | Admitting: Family Medicine

## 2021-09-08 NOTE — Telephone Encounter (Signed)
Called patient to inform her about eliquis approval , she states she cannot afford it (hundreds of dollars,) and would like to see if there is something else more affordable that it can be changed to. Please advise

## 2021-09-08 NOTE — Telephone Encounter (Signed)
Patient went to ER 10/5 and they found a blot clot in lungs. She was given a supply of Eliquis and will be out on Sunday. Also visited [facility] Sunday because she was not feeling good. Had another CT and blood clot was gone.Needs appointment with Dr. Nicki Reaper for follow up and get refill of Eliquis. Please advise  CB#  [phone]

## 2021-09-08 NOTE — Telephone Encounter (Signed)
May have 1 refill on Eliquis Schedule follow-up visit within the next 2 weeks with me

## 2021-09-09 ENCOUNTER — Telehealth: Payer: Self-pay | Admitting: Family Medicine

## 2021-09-09 DIAGNOSIS — I2693 Single subsegmental pulmonary embolism without acute cor pulmonale: Secondary | ICD-10-CM

## 2021-09-09 NOTE — Telephone Encounter (Signed)
Patient has been made aware of the option for coumadin, and indigent program in the area. Informed we will have to contact the pharmacist for any possible options for eliquis that may help her, patient has enough eliquis until 09/12/21 . And has an appt on 09/28/21.

## 2021-09-09 NOTE — Telephone Encounter (Signed)
Call patient to inform her of appointment on 11/15 she stated Eliquis medication  cant afford can you prescribe something else than cheaper. She is a Self-pay patient no insurance. [employer]

## 2021-09-09 NOTE — Telephone Encounter (Signed)
There is not a easy answer for this situation  Coumadin is cheaper but requires very fastidious taking of the medication as well as regular monitoring of the blood with doing regular office visits for INR  Plus also you would require coordination to get onto Coumadin How many days of Eliquis does she have left? I am not sure that she can get Eliquis provided to her through the pharmacy from a indigent program this would be based more so around her income We may have to get the clinical pharmacist to help [place] with this transition to Coumadin She would need to understand that if we initiated Coumadin it would be mandatory that she does regular visits to monitor the Coumadin Without nose regular visits we would not be able to manage this issue safely

## 2021-09-10 MED ORDER — WARFARIN SODIUM 5 MG PO TABS: 5.0000 mg | ORAL_TABLET | Freq: Every day | ORAL | 1 refills | Status: DC

## 2021-09-10 MED ORDER — ENOXAPARIN SODIUM 100 MG/ML IJ SOSY: 100.0000 mg | PREFILLED_SYRINGE | Freq: Two times a day (BID) | INTRAMUSCULAR | 1 refills | Status: DC

## 2021-09-10 NOTE — Telephone Encounter (Signed)
Discussed patient with Dr. Wolfgang Phoenix and since patient is uninsured at the moment and does not fall below the income requirements for the patient assistance program we will be switching her blood thinner to warfarin. We will bridge with Lovenox as warfarin will take 5-7 days to become therapeutic with an INR goal of 2-3. Patient's last dose of Eliquis will be on Sunday evening. Patient will start taking enoxaparin (Lovenox) injections 100 mg subcutaneously every 12 hours Monday morning (09/13/21). Patient will also start warfarin 5 mg by mouth daily starting on 09/13/21. Patient will need to follow-up with our office closely for INR checks. Appointment with PCP moved up to Monday 09/13/21 for evaluation. Patient may need referral to hematology for thrombophilia workup. Duration of anticoagulation at this point has not been established.   Patient has been instructed to continue on anticoagulation as described above with no interruption in therapy. Lovenox injections can be discontinued when INR therapeutic (2-3). Patient was given educational information about warfarin via MyChart message.   Kennon Holter, PharmD Clinical Pharmacist [place] [phone]

## 2021-09-13 ENCOUNTER — Ambulatory Visit (INDEPENDENT_AMBULATORY_CARE_PROVIDER_SITE_OTHER): Payer: Self-pay | Admitting: Family Medicine

## 2021-09-13 ENCOUNTER — Other Ambulatory Visit: Payer: Self-pay

## 2021-09-13 VITALS — BP 134/86 | HR 104 | Temp 97.0°F | Ht 60.0 in | Wt 230.0 lb

## 2021-09-13 DIAGNOSIS — I2693 Single subsegmental pulmonary embolism without acute cor pulmonale: Secondary | ICD-10-CM

## 2021-09-13 NOTE — Patient Instructions (Signed)
Warfarin Information Warfarin is a blood thinner (anticoagulant). Anticoagulants help to prevent the formation of blood clots or keep them from getting bigger. Your health care provider will monitor the anticoagulation effect of warfarin closely and will adjust your medicine as needed. Who should use warfarin? Warfarin is prescribed for people who have blood clots, or who are at risk for developing harmful blood clots, such as people who: Have mechanical heart valves. Have irregular heart rhythms (atrial fibrillation). Have certain clotting disorders. Have had blood clots in the past or are currently receiving treatment for them. This includes people who have had a stroke, blood clots in the lungs (pulmonary embolism, or PE), or blood clots in the legs (deep vein thrombosis,or DVT). How is warfarin taken? Warfarin is taken by mouth (orally). Warfarin tablets come in different strengths. The strength is printed on the tablet, and each strength is a different color. If you get a new prescription and the color of your tablet is different than usual, tell your pharmacist or health care provider immediately. Take warfarin exactly as told by your health care provider, at the same time every day. Doing this helps you avoid bleeding or blood clots that could result in serious injury, pain, or disability. Contact your health care provider if a dose is forgotten or missed. Do not change or take additional dosesto make up for missed or accidental extra doses. What blood tests do I need while taking warfarin? Warfarin is a medicine that needs to be closely monitored with blood tests. It is very important to keep all lab visits and follow-up visits with your health care provider. These tests measure the blood's ability to clot and are called prothrombin tests (PT)or international normalized ratio (INR) tests. These tests can be done with a finger stick or a blood draw. What does the INR test result mean? The PT  test results will be reported as the INR. Your health care provider will tell you your target INR range. If your INR is not in your target range, your health care provider may adjust your dosage. If your INR is above your target range, there is a risk of bleeding. Your dosage of warfarin may need to be decreased. If your INR is below your target range, there is a risk of clotting. Your dosage of warfarin may need to be increased. How often is the INR test needed? When you first start warfarin, you will usually have your INR checked every few days until the health care provider determines the correct dosage of warfarin. After you have reached your target INR, your INR will be tested less often. However, you will need to have your INR checked at least once every 4-6 weeks while you take warfarin. Some people may be able to use home monitoring to check their INR. Ask your health care provider if this applies to you. What are the side effects of warfarin? Too much warfarin can cause bleeding or hemorrhage in any part of the body, such as: Bleeding from the gums. Unexplained bruises or bruises that get larger. A nosebleed that is not easily stopped. Bleeding in the brain (hemorrhagic stroke). Coughing up or vomiting blood. Blood in the urine or stools. Warfarin may also cause: Skin rash or irritations. Nausea that does not go away. Severe pain in the back or joints. Painful toes that turn blue or purple (purple toe syndrome). Painful ulcers that do not go away (skin necrosis). What precautions do I need to take while using warfarin? Wear   a medical alert bracelet or carry a card that lists what medicines you take. Make sure that all health care providers, including your dentist, know you are taking warfarin. Avoid situations that cause bleeding by: Using a softer toothbrush. Flossing with waxed floss. Shaving with an electric razor, not with a blade. Limiting your use of sharp  objects. Avoiding activities that put you at risk for injury, such as contact sports. What do I need to know about warfarin and pregnancy or breastfeeding? If you are taking warfarin and you become pregnant, or plan to become pregnant, contact your health care provider right away. Though warfarin has been associated with birth defects, it can be used in some cases after weighing risks to mother and baby. If you plan to breastfeed while taking warfarin, talk with your health care provider first. What do I need to know about warfarin and alcohol or drug use? Do not drink alcohol if: Your health care provider tells you not to drink. You are pregnant, may be pregnant, or are planning to become pregnant. If you drink alcohol: Limit how much you have to: 0-1 drink a day for women. 0-2 drinks a day for men. Know how much alcohol is in your drink. In the [place] one drink equals one 12 oz bottle of beer (355 mL), one 5 oz glass of wine (148 mL), or one 1 oz glass of hard liquor (44 mL). If you change the amount of alcohol that you drink, tell your health care provider. Your warfarin dosage may need to be changed. Do not use any products that contain nicotine or tobacco. These products include cigarettes, chewing tobacco, and vaping devices, such as e-cigarettes. If you need help quitting, ask your health care provider. If you use nicotine or tobacco products and change the amount that you use, tell your health care provider. Your warfarin dosage may need to be changed. Avoid drug use while taking warfarin. The effects of drugs on warfarin are not known. What do I need to know about warfarin and other medicines or supplements? Many prescription and over-the-counter medicines can interfere with warfarin. Talk with your health care provider or your pharmacist before starting or stopping any new medicines. This includes vitamins, herbs, supplements, and pain medicines. Some common over-the-counter medicines  that may increase the risk of dangerous bleeding while taking warfarin include: Aspirin. NSAIDs, such as ibuprofen or naproxen. Vitamin E. Fish oils. What do I need to know about warfarin and my diet? Vitamin K decreases the effect of warfarin, and it is found in many foods. Eat a consistent amount of foods that contain vitamin K. For example, you may decide to eat 2 servings of vitamin K-containing foods each day. It is important to maintain a normal, balanced diet while taking warfarin. Avoid major changes in your diet. If you are going to change your diet, talk with your health care provider before making changes. Your health care provider may recommend that you work with a dietitian. Contact a health care provider if you: Miss a dose. Take an extra dose. Plan to have any kind of surgery or procedure. Ask whether you should stop taking warfarin or change your dose before your surgery. Are unable to take your medicine due to nausea, vomiting, or diarrhea. Have any major changes in your diet, or you plan to make major changes in your diet. Start or stop any over-the-counter medicine, prescription medicine, herbal supplement, or dietary supplement. Become pregnant, plan to become pregnant, or think you may   be pregnant. Have menstrual periods that are heavier than usual. Have unusual bruising. Get help right away if you: Have signs of an allergic reaction, such as: Swelling of the lips, face, tongue, mouth, or throat. Rash or itchy, red, swollen areas of skin (hives). Trouble breathing. Chest tightness. Fall or have an accident, especially if you hit your head. Have signs that your blood is too thin, such as: Blood in your urine. Your urine may look reddish, pinkish, or tea-colored. Blood in your stool. Your stool may be black or bright red. Coughing up or vomiting blood. The blood may be bright red, or it may look like coffee grounds. Bleeding that does not stop after applying pressure  to the area for 30 minutes. Have signs of a blood clot in your leg or arm, such as: Pain or swelling in your leg or arm. Skin that is red or warm to the touch on your arm or leg. Have signs of blood in your lung, such as: Shortness of breath or difficulty breathing. Chest pain. Unexplained fever. Have any symptoms of a stroke. "BE FAST" is an easy way to remember the main warning signs of a stroke: B - Balance. Signs are dizziness, sudden trouble walking, or loss of balance. E - Eyes. Signs are trouble seeing or a sudden change in vision. F - Face. Signs are sudden weakness or numbness of the face, or the face or eyelid drooping on one side. A - Arms. Signs are weakness or numbness in an arm. This happens suddenly and usually on one side of the body. S - Speech. Signs are sudden trouble speaking, slurred speech, or trouble understanding what people say. T - Time. Time to call emergency services. Write down what time symptoms started. Have other signs of a stroke, such as: A sudden, severe headache with no known cause. Nausea or vomiting. Seizure. Have other signs of a reaction to warfarin, such as: Purple or blue toes. Skin ulcers that do not go away. These symptoms may represent a serious problem that is an emergency. Do not wait to see if the symptoms will go away. Get medical help right away. Call your local emergency services (911 in the [place] Do not drive yourself to the hospital. Summary Warfarin is a medicine that thins blood. It is used to prevent or treat blood clots. You must be monitored closely while on this medicine. Keep all follow-up visits. Make sure that you know your target INR range and your warfarin dosage. Wear or carry identification that says you are taking warfarin. Take warfarin at the same time every day. Call your health care provider if you miss a dose or if you take an extra dose. Do not change the dosage of warfarin on your own. Know the signs and symptoms  of blood clots, bleeding, and a stroke. Know when to get emergency medical help. This information is not intended to replace advice given to you by your health care provider. Make sure you discuss any questions you have with your health care provider. Document Revised: 01/20/2021 Document Reviewed: 01/20/2021 Elsevier Patient Education  [place] and Warfarin Warfarin is a blood thinner (anticoagulant). Anticoagulant medicines help prevent blood clots from forming or getting bigger. Warfarin works by blocking the activity of vitamin K. Vitamin K promotes normal blood clotting. When you take warfarin, problems can occur from suddenly increasing or decreasing the amount of vitamin K that you eat from one day to the next. These problems  can occur due to varying levels of warfarin in your blood. Problems may include blood clots or bleeding. What are tips for eating the right amount of vitamin K? Reading food labels Know which foods contain vitamin K. Read food labels. Use the lists below to understand serving sizes and the amount of vitamin K in one serving. If you take a multivitamin that contains vitamin K, be sure to take it every day. Meal planning To avoid problems when taking warfarin: Eat a balanced diet that includes: Fresh fruits and vegetables. Whole grains. Low-fat dairy products. Lean proteins, such as fish, eggs, and lean cuts of meat. Avoid major changes in your diet. If you are going to change your diet, talk with your health care provider before making changes. Keep your intake of vitamin K consistent from day to day. Avoid eating large amounts of vitamin K one day and small amounts of vitamin K the next day. Work with a dietitian to develop a meal plan that works best for you.  What foods are high in vitamin K? Foods that are high in vitamin K contain more than 100 mcg (micrograms) per serving. These include: Broccoli (cooked from fresh) -  cup (78 g)  has 110 mcg. Brussels sprouts (cooked from fresh) -  cup (78 g) has 109 mcg. Greens, beet (cooked from fresh) -  cup (72 g) has 350 mcg. Greens, collard (cooked from fresh) -  cup (66 g) has 263 mcg. Greens, turnip (cooked from fresh) -  cup (72 g) has 265 mcg. Green onions or scallions -  cup (50 g) has 105 mcg. Kale (cooked from fresh) -  cup (68 g) has 536 mcg. Parsley (raw) - 10 sprigs (10 g) has 164 mcg. Spinach (cooked from fresh) -  cup (90 g) has 444 mcg. Swiss chard (cooked from fresh) -  cup (88 g) has 287 mcg. The items listed above may not be a complete list of foods high in Vitamin K. Actual amounts of Vitamin K may differ depending on processing. Contact a dietitian for more information. What foods have a moderate amount of vitamin K? Foods that have a moderate amount of vitamin K contain 25-100 mcg per serving. These include: Asparagus (cooked from fresh) - 4 spears (60 g) have 30 mcg. Black-eyed peas (dried) -  cup (85 g) has 32 mcg. Cabbage (cooked from fresh) -  cup (78 g) has 84 mcg. Cabbage (raw) -  cup (35 g) has 26 mcg. Kiwi fruit - 1 medium (69 g) has 27 mcg. Lettuce (raw) - 1 cup (36 g) has 45 mcg. Okra (cooked from fresh) -  cup (80 g) has 32 mcg. Prunes (dried) - 5 prunes (47 g) have 25 mcg. Tuna, light, canned in oil - 3 oz (85 g) has 37 mcg. Watercress (raw) - 1 cup (34 g) has 85 mcg. The items listed above may not be a complete list of foods with a moderate amount of Vitamin K. Actual amounts of Vitamin K may differ depending on processing. Contact a dietitian for more information. What foods are low in vitamin K? Foods low in vitamin K contain less than 25 mcg per serving. These include: Artichoke - 1 medium (128 g) has 18 mcg. Avocado - 1 oz (21 g) has 6 mcg. Blueberries -  cup (73 g) has 14 mcg. Carrots (cooked from fresh) -  cup (78 g) has 11 mcg. Cauliflower (raw) -  cup (54 g) has 8 mcg. Cucumber with peel (raw) -  cup (52 g) has 9  mcg. Grapes -  cup (76 g) has 12 mcg. Mango - 1 medium (207 g) has 9 mcg. Mixed nuts - 1 cup (142 g) has 17 mcg. Pear - 1 medium (178 g) has 8 mcg. Peas (cooked from fresh) -  cup (80 g) has 20 mcg. Pickled cucumber - 1 spear (65 g) has 11 mcg. Sauerkraut (canned) -  cup (118 g) has 16 mcg. Soybeans (cooked from fresh) -  cup (86 g) has 16 mcg. Tomato (raw) - 1 medium (123 g) has 10 mcg. Tomato sauce (raw) -  cup (123 g) has 17 mcg. The items listed above may not be a complete list of foods low in Vitamin K. Actual amounts of Vitamin K may differ depending on processing. Contact a dietitian for more information. What foods do not have vitamin K? If a food contains less than 5 mcg per serving, it is considered to have no vitamin K. These foods include: Bread and cereal products. Cheese. Eggs. Fish and shellfish. Meat and poultry. Milk and dairy products. Seeds, such as sunflower or pumpkin seeds. The items listed above may not be a complete list of foods that do not have vitamin K. Actual amounts of vitamin K may differ depending on processing. Contact a dietitian for more information. Summary Warfarin is an anticoagulant that prevents blood clots from forming or getting bigger by blocking the activity of vitamin K. It is important to know the amount of vitamin K that is in the foods you eat and to keep your intake of vitamin K consistent from day to day. Avoid major changes in your diet. If you are going to change your diet, talk with your health care provider before making changes. This information is not intended to replace advice given to you by your health care provider. Make sure you discuss any questions you have with your health care provider. Document Revised: 01/06/2021 Document Reviewed: 01/06/2021 Elsevier Patient Education  [place].

## 2021-09-13 NOTE — Progress Notes (Signed)
   Subjective:    Patient ID: Kristine Miller, female    DOB: 06/08/1961, 60 y.o.   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 Patient was having ongoing chest pain so she went to [facility] and had a CT scan which showed a pulmonary embolus she was placed on Eliquis 4 days later she went to [facility] because of ongoing pain and discomfort and at that time a pulmonary embolus was not seen on the CAT scan But she has a strong family history of blood clots including her mother who was on Xarelto for years And her sister who is on Coumadin Laryngitis, cough x 2 days - negative covid test last night denies fever chills sweats   Review of Systems     Objective:   Physical Exam Lungs are clear respiratory rate normal heart regular pulse normal no respiratory distress HEENT benign       Assessment & Plan:  Viral syndrome Supportive measures discussed COVID test at home negative  Reported pulmonary embolus follow-up CAT scan did not show pulmonary embolus Very difficult to know which one to trust Also given her strong family history I believe she deserves hematology work-up We will go ahead with referral  Continue the Lovenox this week she was also talked to regarding dietary measures regarding Coumadin she was also discussed what to watch for regarding bleeding issues She will follow-up for INR on Friday morning

## 2021-09-14 ENCOUNTER — Encounter: Payer: Self-pay | Admitting: Family Medicine

## 2021-09-14 NOTE — Telephone Encounter (Signed)
Patient was seen and initiated on Lovenox and Coumadin as INR this coming Friday

## 2021-09-14 NOTE — Telephone Encounter (Signed)
Robitussin-DM Mucinex  I would recommend avoiding antibiotics because this can interact with the warfarin

## 2021-09-17 ENCOUNTER — Other Ambulatory Visit: Payer: Self-pay

## 2021-09-17 ENCOUNTER — Inpatient Hospital Stay (HOSPITAL_COMMUNITY): Payer: Self-pay | Admitting: Hematology

## 2021-09-17 ENCOUNTER — Other Ambulatory Visit (INDEPENDENT_AMBULATORY_CARE_PROVIDER_SITE_OTHER): Payer: Self-pay

## 2021-09-17 DIAGNOSIS — Z7901 Long term (current) use of anticoagulants: Secondary | ICD-10-CM

## 2021-09-17 LAB — POCT INR: INR: 2 (ref 2.0–3.0)

## 2021-09-17 NOTE — Patient Instructions (Addendum)
Continue current Coumadin dose; stop Lovenox shots; come back Monday 09/20/21 for repeat INR

## 2021-09-20 ENCOUNTER — Other Ambulatory Visit: Payer: Self-pay

## 2021-09-20 ENCOUNTER — Other Ambulatory Visit (INDEPENDENT_AMBULATORY_CARE_PROVIDER_SITE_OTHER): Payer: Self-pay

## 2021-09-20 DIAGNOSIS — Z7901 Long term (current) use of anticoagulants: Secondary | ICD-10-CM

## 2021-09-20 LAB — POCT INR: INR: 2.3 (ref 2.0–3.0)

## 2021-09-20 NOTE — Progress Notes (Signed)
Patient comes in today for INR check - 2.3  Continue coumadin 5 mg QD recheck 1 week

## 2021-09-22 NOTE — Progress Notes (Signed)
[facility] [address] [place] [address]   CLINIC:  Medical Oncology/Hematology  Patient Care Team: Kathyrn Drown, MD as PCP - General (Family Medicine) Derek Jack, MD as Medical Oncologist (Hematology)  CHIEF COMPLAINTS/PURPOSE OF CONSULTATION:  Evaluation of single subsegmental pulmonary embolism without acute cor pulmonale   HISTORY OF PRESENTING ILLNESS:  Kristine Miller 60 y.o. female is here because of evaluation of single subsegmental pulmonary embolism without acute cor pulmonale, at the request of Dr. Wolfgang Phoenix.  Today she reports feeling well. She reports she had left sided CP prior to her PE on 10/05 which has since resolved. She took 30 days of Eliquis, and she has now been on Warfarin for 1 week. She denies any bleeding when on Eliquis or any current bleeding. She denies previous history of PE and DVT, but she reports her mother had a DVT in her right leg following a long car trip to [place], and sister had a thrombus to her heart; her sister has been on Warfarin for 1 year, and her mother had been put on Warfarin for 1 year before stopping. She is not on estrogen or birth control pills. She has a history of MS from which she has pain in her legs and arms, difficulty swallowing, lack of concentration, and memory loss; she is not currently on treatment for MS due to losing her insurance. She denies joint pains, fevers, night sweats, and weight loss. She reports a sinus infection last week. She has never previously had a colonoscopy. 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 She denies history of smoking. Her maternal grandmother had pancreatic cancer, her mother had skin and colon cancer, and her maternal uncle had throat cancer. She denies family history of lupus.  MEDICAL HISTORY:  Past Medical History:  Diagnosis Date   Anxiety    Asthma     Chronic headaches    IBS (irritable bowel syndrome)    MS (multiple sclerosis) (HCC)    Palpitations    Pneumonia    Rotator cuff arthropathy, left     SURGICAL HISTORY: Past Surgical History:  Procedure Laterality Date   ABDOMINAL HYSTERECTOMY     APPENDECTOMY     CESAREAN SECTION     CHOLECYSTECTOMY     plate and pin in right arm     ROTATOR CUFF REPAIR      SOCIAL HISTORY: Social History   Socioeconomic History   Marital status: Married    Spouse name: Not on file   Number of children: Not on file   Years of education: Not on file   Highest education level: Not on file  Occupational History   Not on file  Tobacco Use   Smoking status: Never   Smokeless tobacco: Never  Substance and Sexual Activity   Alcohol use: No   Drug use: No   Sexual activity: Yes    Birth control/protection: Surgical    Comment: hyst  Other Topics Concern   Not on file  Social History Narrative   Homemaker. Does not work outside the home.   Social Determinants of Health   Financial Resource Strain: Not on file  Food Insecurity: Not on file  Transportation Needs: Not on file  Physical Activity: Not on file  Stress: Not on file  Social Connections: Not on file  Intimate Partner Violence: Not on file  FAMILY HISTORY: Family History  Problem Relation Age of Onset   Cancer Mother    Diabetes Mother    Diabetes type I Daughter    Other Daughter        stomach issue   Cancer Maternal Grandmother    Cancer Maternal Grandfather    Congestive Heart Failure Maternal Grandfather    Other Sister        blood clot in leg   Seizures Son     ALLERGIES:  is allergic to morphine and related, penicillins, and prednisone.  MEDICATIONS:  Current Outpatient Medications  Medication Sig Dispense Refill   Blood Pressure KIT 1 Bag by Does not apply route daily. 1 kit 0   NON FORMULARY Apply 1 application topically daily as needed (affected area/pain). CBD oil     warfarin (COUMADIN) 5 MG  tablet Take 1 tablet (5 mg total) by mouth daily. Start on 09/13/21 30 tablet 1   No current facility-administered medications for this visit.    REVIEW OF SYSTEMS:   Review of Systems  Constitutional:  Negative for appetite change, fatigue (50%), fever and unexpected weight change.  HENT:   Positive for trouble swallowing. Negative for nosebleeds.   Respiratory:  Positive for cough. Negative for hemoptysis.   Cardiovascular:  Negative for chest pain (resolved).  Gastrointestinal:  Positive for diarrhea. Negative for blood in stool.  Genitourinary:  Negative for hematuria.   Musculoskeletal:  Positive for myalgias (legs and arms).  Neurological:  Positive for headaches and numbness (R leg and R arm).  Psychiatric/Behavioral:  Positive for confusion (memory loss) and decreased concentration.   All other systems reviewed and are negative.   PHYSICAL EXAMINATION: ECOG PERFORMANCE STATUS: 0 - Asymptomatic  Vitals:   09/24/21 0914  BP: (!) 161/90  Pulse: 75  Resp: 16  Temp: 98.9 F (37.2 C)  SpO2: 95%   Filed Weights   09/24/21 0914  Weight: 235 lb (106.6 kg)   Physical Exam Vitals reviewed.  Constitutional:      Appearance: Normal appearance.  Cardiovascular:     Rate and Rhythm: Normal rate and regular rhythm.     Pulses: Normal pulses.     Heart sounds: Normal heart sounds.  Pulmonary:     Effort: Pulmonary effort is normal.     Breath sounds: Normal breath sounds.  Abdominal:     Palpations: Abdomen is soft. There is no hepatomegaly, splenomegaly or mass.     Tenderness: There is no abdominal tenderness.  Neurological:     General: No focal deficit present.     Mental Status: She is alert and oriented to person, place, and time.  Psychiatric:        Mood and Affect: Mood normal.        Behavior: Behavior normal.     LABORATORY DATA:  I have reviewed the data as listed Recent Results (from the past 2160 hour(s))  Basic metabolic panel     Status: Abnormal    Collection Time: 08/17/21 10:37 PM  Result Value Ref Range   Sodium 139 135 - 145 mmol/L   Potassium 3.8 3.5 - 5.1 mmol/L   Chloride 104 98 - 111 mmol/L   CO2 28 22 - 32 mmol/L   Glucose, Bld 101 (H) 70 - 99 mg/dL    Comment: Glucose reference range applies only to samples taken after fasting for at least 8 hours.   BUN 9 6 - 20 mg/dL   Creatinine, Ser 0.81 0.44 - 1.00 mg/dL  Calcium 9.2 8.9 - 10.3 mg/dL   GFR, Estimated >60 >60 mL/min    Comment: (NOTE) Calculated using the CKD-EPI Creatinine Equation (2021)    Anion gap 7 5 - 15    Comment: Performed at [facility], [address] [place], [place] [address]  CBC     Status: Abnormal   Collection Time: 08/17/21 10:37 PM  Result Value Ref Range   WBC 13.8 (H) 4.0 - 10.5 K/uL   RBC 4.96 3.87 - 5.11 MIL/uL   Hemoglobin 14.7 12.0 - 15.0 g/dL   HCT 44.4 36.0 - 46.0 %   MCV 89.5 80.0 - 100.0 fL   MCH 29.6 26.0 - 34.0 pg   MCHC 33.1 30.0 - 36.0 g/dL   RDW 13.8 11.5 - 15.5 %   Platelets 411 (H) 150 - 400 K/uL   nRBC 0.0 0.0 - 0.2 %    Comment: Performed at [facility], [address] [place], [place] [address]  Troponin I (High Sensitivity)     Status: None   Collection Time: 08/17/21 10:37 PM  Result Value Ref Range   Troponin I (High Sensitivity) 3 <18 ng/L    Comment: (NOTE) Elevated high sensitivity troponin I (hsTnI) values and significant  changes across serial measurements may suggest ACS but many other  chronic and acute conditions are known to elevate hsTnI results.  Refer to the "Links" section for chest pain algorithms and additional  guidance. Performed at [facility], [address] [place], [place] [address]   D-dimer, quantitative     Status: Abnormal   Collection Time: 08/17/21 10:37 PM  Result Value Ref Range   D-Dimer, Quant 0.72 (H) 0.00 - 0.50 ug/mL-FEU    Comment: (NOTE) At the manufacturer cut-off value of 0.5 g/mL FEU, this assay has a negative predictive value of 95-100%.This assay is intended  for use in conjunction with a clinical pretest probability (PTP) assessment model to exclude pulmonary embolism (PE) and deep venous thrombosis (DVT) in outpatients suspected of PE or DVT. Results should be correlated with clinical presentation. Performed at [facility], [address] [place], [place] [address]   Troponin I (High Sensitivity)     Status: None   Collection Time: 08/18/21 12:15 AM  Result Value Ref Range   Troponin I (High Sensitivity) 3 <18 ng/L    Comment: (NOTE) Elevated high sensitivity troponin I (hsTnI) values and significant  changes across serial measurements may suggest ACS but many other  chronic and acute conditions are known to elevate hsTnI results.  Refer to the "Links" section for chest pain algorithms and additional  guidance. Performed at [facility], [address] [place], [place] [address]   POC urine preg, ED     Status: None   Collection Time: 08/18/21  1:40 AM  Result Value Ref Range   Preg Test, Ur Negative Negative  Resp Panel by RT-PCR (Flu A&B, Covid) Nasopharyngeal Swab     Status: None   Collection Time: 08/18/21  3:55 AM   Specimen: Nasopharyngeal Swab; Nasopharyngeal(NP) swabs in vial transport medium  Result Value Ref Range   SARS Coronavirus 2 by RT PCR NEGATIVE NEGATIVE    Comment: (NOTE) SARS-CoV-2 target nucleic acids are NOT DETECTED.  The SARS-CoV-2 RNA is generally detectable in upper respiratory specimens during the acute phase of infection. The lowest concentration of SARS-CoV-2 viral copies this assay can detect is 138 copies/mL. A negative result does not preclude SARS-Cov-2 infection and should not be used as the sole  basis for treatment or other patient management decisions. A negative result may occur with  improper specimen collection/handling, submission of specimen other than nasopharyngeal swab, presence of viral mutation(s) within the areas targeted by this assay, and inadequate number of viral copies(<138  copies/mL). A negative result must be combined with clinical observations, patient history, and epidemiological information. The expected result is Negative.  Fact Sheet for Patients:  EntrepreneurPulse.com.au  Fact Sheet for Healthcare Providers:  IncredibleEmployment.be  This test is no t yet approved or cleared by the [place] FDA and  has been authorized for detection and/or diagnosis of SARS-CoV-2 by FDA under an Emergency Use Authorization (EUA). This EUA will remain  in effect (meaning this test can be used) for the duration of the COVID-19 declaration under Section 564(b)(1) of the Act, 21 U.S.C.section 360bbb-3(b)(1), unless the authorization is terminated  or revoked sooner.       Influenza A by PCR NEGATIVE NEGATIVE   Influenza B by PCR NEGATIVE NEGATIVE    Comment: (NOTE) The Xpert Xpress SARS-CoV-2/FLU/RSV plus assay is intended as an aid in the diagnosis of influenza from Nasopharyngeal swab specimens and should not be used as a sole basis for treatment. Nasal washings and aspirates are unacceptable for Xpert Xpress SARS-CoV-2/FLU/RSV testing.  Fact Sheet for Patients: EntrepreneurPulse.com.au  Fact Sheet for Healthcare Providers: IncredibleEmployment.be  This test is not yet approved or cleared by the [place] FDA and has been authorized for detection and/or diagnosis of SARS-CoV-2 by FDA under an Emergency Use Authorization (EUA). This EUA will remain in effect (meaning this test can be used) for the duration of the COVID-19 declaration under Section 564(b)(1) of the Act, 21 U.S.C. section 360bbb-3(b)(1), unless the authorization is terminated or revoked.  Performed at [facility], [address], [place], [place] [address]   HIV Antibody (routine testing w rflx)     Status: None   Collection Time: 08/18/21  4:29 AM  Result Value Ref Range   HIV Screen 4th Generation wRfx  Non Reactive Non Reactive    Comment: Performed at [facility], [place] [address] [place], [place] [address]  Comprehensive metabolic panel     Status: Abnormal   Collection Time: 08/18/21  4:29 AM  Result Value Ref Range   Sodium 139 135 - 145 mmol/L   Potassium 3.5 3.5 - 5.1 mmol/L   Chloride 106 98 - 111 mmol/L   CO2 26 22 - 32 mmol/L   Glucose, Bld 105 (H) 70 - 99 mg/dL    Comment: Glucose reference range applies only to samples taken after fasting for at least 8 hours.   BUN 10 6 - 20 mg/dL   Creatinine, Ser 0.80 0.44 - 1.00 mg/dL   Calcium 9.0 8.9 - 10.3 mg/dL   Total Protein 6.7 6.5 - 8.1 g/dL   Albumin 3.4 (L) 3.5 - 5.0 g/dL   AST 15 15 - 41 U/L   ALT 14 0 - 44 U/L   Alkaline Phosphatase 77 38 - 126 U/L   Total Bilirubin 0.6 0.3 - 1.2 mg/dL   GFR, Estimated >60 >60 mL/min    Comment: (NOTE) Calculated using the CKD-EPI Creatinine Equation (2021)    Anion gap 7 5 - 15    Comment: Performed at [facility], [address], [place], [place] [address]  CBC     Status: Abnormal   Collection Time: 08/18/21  4:29 AM  Result Value Ref Range   WBC 12.8 (H) 4.0 - 10.5 K/uL  RBC 4.67 3.87 - 5.11 MIL/uL   Hemoglobin 13.9 12.0 - 15.0 g/dL   HCT 41.6 36.0 - 46.0 %   MCV 89.1 80.0 - 100.0 fL   MCH 29.8 26.0 - 34.0 pg   MCHC 33.4 30.0 - 36.0 g/dL   RDW 13.9 11.5 - 15.5 %   Platelets 393 150 - 400 K/uL   nRBC 0.0 0.0 - 0.2 %    Comment: Performed at [facility], [address], [place], [place] [address]  Comprehensive metabolic panel     Status: Abnormal   Collection Time: 08/18/21  4:29 AM  Result Value Ref Range   Sodium 139 135 - 145 mmol/L   Potassium 3.4 (L) 3.5 - 5.1 mmol/L   Chloride 106 98 - 111 mmol/L   CO2 26 22 - 32 mmol/L   Glucose, Bld 106 (H) 70 - 99 mg/dL    Comment: Glucose reference range applies only to samples taken after fasting for at least 8 hours.   BUN 10 6 - 20 mg/dL   Creatinine, Ser 0.82 0.44 - 1.00 mg/dL   Calcium 9.0 8.9 - 10.3 mg/dL    Total Protein 6.8 6.5 - 8.1 g/dL   Albumin 3.4 (L) 3.5 - 5.0 g/dL   AST 14 (L) 15 - 41 U/L   ALT 15 0 - 44 U/L   Alkaline Phosphatase 76 38 - 126 U/L   Total Bilirubin 0.6 0.3 - 1.2 mg/dL   GFR, Estimated >60 >60 mL/min    Comment: (NOTE) Calculated using the CKD-EPI Creatinine Equation (2021)    Anion gap 7 5 - 15    Comment: Performed at [facility], [address] [place], [place] [address]  Magnesium     Status: None   Collection Time: 08/18/21  4:29 AM  Result Value Ref Range   Magnesium 2.0 1.7 - 2.4 mg/dL    Comment: Performed at [facility], [address] [place], [place] [address]  Heparin level (unfractionated)     Status: Abnormal   Collection Time: 08/18/21  8:01 AM  Result Value Ref Range   Heparin Unfractionated >1.10 (H) 0.30 - 0.70 IU/mL    Comment: (NOTE) The clinical reportable range upper limit is being lowered to >1.10 to align with the FDA approved guidance for the current laboratory assay.  If heparin results are below expected values, and patient dosage has  been confirmed, suggest follow up testing of antithrombin III levels. Performed at [facility], [address], [place], [place] [address]   Protime-INR     Status: None   Collection Time: 08/18/21  8:01 AM  Result Value Ref Range   Prothrombin Time 13.7 11.4 - 15.2 seconds   INR 1.1 0.8 - 1.2    Comment: (NOTE) INR goal varies based on device and disease states. Performed at [facility], [address] [place], [place] [address]   ECHOCARDIOGRAM COMPLETE     Status: None   Collection Time: 08/18/21 12:21 PM  Result Value Ref Range   Weight 3,744 oz   Height 60 in   BP 132/82 mmHg   AR max vel 2.57 cm2   AV Area VTI 2.71 cm2   AV Mean grad 3.0 mmHg   AV Peak grad 5.3 mmHg   Ao pk vel 1.15 m/s   AV Area mean vel 2.51 cm2   MV VTI 3.13 cm2   Area-P 1/2 4.68 cm2   S' Lateral 3.00 cm  CBC with Differential  Status: Abnormal   Collection Time: 08/21/21 10:43 PM  Result Value Ref  Range   WBC 13.4 (H) 4.0 - 10.5 K/uL   RBC 5.36 (H) 3.87 - 5.11 MIL/uL   Hemoglobin 15.4 (H) 12.0 - 15.0 g/dL   HCT 46.7 (H) 36.0 - 46.0 %   MCV 87.1 80.0 - 100.0 fL   MCH 28.7 26.0 - 34.0 pg   MCHC 33.0 30.0 - 36.0 g/dL   RDW 13.6 11.5 - 15.5 %   Platelets 406 (H) 150 - 400 K/uL   nRBC 0.0 0.0 - 0.2 %   Neutrophils Relative % 66 %   Neutro Abs 8.9 (H) 1.7 - 7.7 K/uL   Lymphocytes Relative 26 %   Lymphs Abs 3.4 0.7 - 4.0 K/uL   Monocytes Relative 7 %   Monocytes Absolute 0.9 0.1 - 1.0 K/uL   Eosinophils Relative 1 %   Eosinophils Absolute 0.1 0.0 - 0.5 K/uL   Basophils Relative 0 %   Basophils Absolute 0.1 0.0 - 0.1 K/uL   Immature Granulocytes 0 %   Abs Immature Granulocytes 0.04 0.00 - 0.07 K/uL    Comment: Performed at [facility], [address] [place], [place] [address]  Basic metabolic panel     Status: Abnormal   Collection Time: 08/21/21 10:43 PM  Result Value Ref Range   Sodium 140 135 - 145 mmol/L   Potassium 4.1 3.5 - 5.1 mmol/L   Chloride 104 98 - 111 mmol/L   CO2 27 22 - 32 mmol/L   Glucose, Bld 104 (H) 70 - 99 mg/dL    Comment: Glucose reference range applies only to samples taken after fasting for at least 8 hours.   BUN 8 6 - 20 mg/dL   Creatinine, Ser 0.90 0.44 - 1.00 mg/dL   Calcium 9.2 8.9 - 10.3 mg/dL   GFR, Estimated >60 >60 mL/min    Comment: (NOTE) Calculated using the CKD-EPI Creatinine Equation (2021)    Anion gap 9 5 - 15    Comment: Performed at [facility] [address], [place], [place] [address]  Troponin I (High Sensitivity)     Status: None   Collection Time: 08/21/21 10:43 PM  Result Value Ref Range   Troponin I (High Sensitivity) 4 <18 ng/L    Comment: (NOTE) Elevated high sensitivity troponin I (hsTnI) values and significant  changes across serial measurements may suggest ACS but many other  chronic and acute conditions are known to elevate hsTnI results.  Refer to the "Links" section for chest pain algorithms and  additional  guidance. Performed at [facility], [place] [address], [place], [place] [address]   Troponin I (High Sensitivity)     Status: None   Collection Time: 08/22/21  1:00 AM  Result Value Ref Range   Troponin I (High Sensitivity) 7 <18 ng/L    Comment: (NOTE) Elevated high sensitivity troponin I (hsTnI) values and significant  changes across serial measurements may suggest ACS but many other  chronic and acute conditions are known to elevate hsTnI results.  Refer to the "Links" section for chest pain algorithms and additional  guidance. Performed at [facility], [place] [address], [place], [place] [address]   POCT INR     Status: None   Collection Time: 09/17/21 10:19 AM  Result Value Ref Range   INR 2.0 2.0 - 3.0  POCT INR     Status: Normal   Collection Time: 09/20/21 10:05  AM  Result Value Ref Range   INR 2.3 2.0 - 3.0    RADIOGRAPHIC STUDIES: I have personally reviewed the radiological images as listed and agreed with the findings in the report. No results found.  ASSESSMENT:  Unprovoked small subsegmental pulmonary embolism: - Presentation to the ER with left chest pain and shortness of breath. - CT angiogram on 08/18/2021 showed small subsegmental pulmonary embolus in a branch of the left upper lobe pulmonary artery. - Ultrasound of the lower extremities was negative for DVT on 08/18/2021. - She was started on Eliquis. - Went to [facility] on 08/22/2021 with worsening left chest pain. - CT angiogram on 08/22/2021-no pulmonary embolus identified; distal lower lobe branches are degraded by motion. - She is transitioned to warfarin after a month of Eliquis.  She does not report any chest pain at this time. 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  No prior history of DVT. - She never had colonoscopy.  Last mammogram on 03/08/2017, benign. - She has diagnosis of MS which presents as a pain and numbness in the legs, trouble swallowing and slurred  speech and decreased concentration.  She is currently on CBD oils.   Social/family history: - She lives at home.  She is currently not working.  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  She is a non-smoker. - Mother had right leg DVT which was provoked after a long road trip, received limited duration anticoagulation. - Sister had clot in her heart, has been on warfarin for 1 year. - Maternal grandmother had pancreatic cancer.  Mother had colon cancer.  Maternal uncle had throat cancer.   PLAN:  Unprovoked a small subsegmental pulmonary embolism: - She has no known risk factors except obesity. - Repeat CT after 4 days of Eliquis showed no further clot. - Recommend at least 3 months of anticoagulation.  She will stop warfarin in mid January. - We will check D-dimer, lupus anticoagulant, anticardiolipin antibody, antibeta-2 glycoprotein 1 antibody about 3 to 4 weeks after discontinuation of anticoagulation. - RTC 1 week after blood tests.   All questions were answered. The patient knows to call the clinic with any problems, questions or concerns.  Derek Jack, MD 09/24/21 9:51 AM  [facility] [phone]   I, Thana Ates, am acting as a scribe for Dr. Derek Jack.  I, Derek Jack MD, have reviewed the above documentation for accuracy and completeness, and I agree with the above.

## 2021-09-24 ENCOUNTER — Encounter (HOSPITAL_COMMUNITY): Payer: Self-pay | Admitting: Hematology

## 2021-09-24 ENCOUNTER — Inpatient Hospital Stay (HOSPITAL_COMMUNITY): Payer: Self-pay | Attending: Hematology | Admitting: Hematology

## 2021-09-24 ENCOUNTER — Other Ambulatory Visit: Payer: Self-pay

## 2021-09-24 VITALS — BP 161/90 | HR 75 | Temp 98.9°F | Resp 16 | Ht 60.0 in | Wt 235.0 lb

## 2021-09-24 DIAGNOSIS — G35 Multiple sclerosis: Secondary | ICD-10-CM | POA: Insufficient documentation

## 2021-09-24 DIAGNOSIS — J45909 Unspecified asthma, uncomplicated: Secondary | ICD-10-CM | POA: Insufficient documentation

## 2021-09-24 DIAGNOSIS — K589 Irritable bowel syndrome without diarrhea: Secondary | ICD-10-CM | POA: Insufficient documentation

## 2021-09-24 DIAGNOSIS — Z7901 Long term (current) use of anticoagulants: Secondary | ICD-10-CM | POA: Insufficient documentation

## 2021-09-24 DIAGNOSIS — Z86711 Personal history of pulmonary embolism: Secondary | ICD-10-CM | POA: Insufficient documentation

## 2021-09-24 DIAGNOSIS — F419 Anxiety disorder, unspecified: Secondary | ICD-10-CM | POA: Insufficient documentation

## 2021-09-24 DIAGNOSIS — I2693 Single subsegmental pulmonary embolism without acute cor pulmonale: Secondary | ICD-10-CM

## 2021-09-24 DIAGNOSIS — Z800 Family history of malignant neoplasm of digestive organs: Secondary | ICD-10-CM | POA: Insufficient documentation

## 2021-09-24 NOTE — Patient Instructions (Addendum)
[place] at [facility] Discharge Instructions  You were seen and examined today by Dr. Delton Coombes. Dr. Delton Coombes is a hematologist, meaning that he specializes in blood disorders. Dr. Delton Coombes discussed your past medical history, family history of cancers/blood disorders, and the events that led to you being here today.  You were referred to Dr. Delton Coombes due to your recent pulmonary embolism (blood clot in your lung). There was no reason for this blood clot, and for this reason, you will need to remain on Warfarin for 2 more months. Dr. Delton Coombes has recommended a test known as Lupus Anticoagulant Panel. This test is associated with an increased risk of blood clots. To run this test, you will need to be off of Warfarin for at least 3 weeks.  If you can come off of Warfarin in mid-January, we will run the test in mid-February.  Follow-up as scheduled.   Thank you for choosing [facility] at [facility] to provide your oncology and hematology care.  To afford each patient quality time with our provider, please arrive at least 15 minutes before your scheduled appointment time.   If you have a lab appointment with the [facility] please come in thru the Main Entrance and check in at the main information desk.  You need to re-schedule your appointment should you arrive 10 or more minutes late.  We strive to give you quality time with our providers, and arriving late affects you and other patients whose appointments are after yours.  Also, if you no show three or more times for appointments you may be dismissed from the clinic at the providers discretion.     Again, thank you for choosing [facility].  Our hope is that these requests will decrease the amount of time that you wait before being seen by our physicians.       _____________________________________________________________  Should you have questions after your visit to [facility], please contact our office at [phone] and follow the prompts.  Our office hours are 8:00 a.m. and 4:30 p.m. Monday - Friday.  Please note that voicemails left after 4:00 p.m. may not be returned until the following business day.  We are closed weekends and major holidays.  You do have access to a nurse 24-7, just call the main number to the clinic [phone] and do not press any options, hold on the line and a nurse will answer the phone.    For prescription refill requests, have your pharmacy contact our office and allow 72 hours.    Due to Covid, you will need to wear a mask upon entering the hospital. If you do not have a mask, a mask will be given to you at the Main Entrance upon arrival. For doctor visits, patients may have 1 support person age 23 or older with them. For treatment visits, patients can not have anyone with them due to social distancing guidelines and our immunocompromised population.

## 2021-09-28 ENCOUNTER — Other Ambulatory Visit: Payer: Self-pay

## 2021-09-28 ENCOUNTER — Ambulatory Visit: Payer: Self-pay | Admitting: Family Medicine

## 2021-09-28 ENCOUNTER — Other Ambulatory Visit (INDEPENDENT_AMBULATORY_CARE_PROVIDER_SITE_OTHER): Payer: Self-pay

## 2021-09-28 DIAGNOSIS — Z7901 Long term (current) use of anticoagulants: Secondary | ICD-10-CM

## 2021-09-28 LAB — POCT INR: INR: 3.5 — AB (ref 2.0–3.0)

## 2021-09-28 NOTE — Patient Instructions (Signed)
Take 1/2 tablet on Tuesday and Saturday. Take one whole tablet the rest of the days. Recheck INR in 7-10 days.

## 2021-10-05 ENCOUNTER — Other Ambulatory Visit: Payer: Self-pay

## 2021-10-05 ENCOUNTER — Other Ambulatory Visit (INDEPENDENT_AMBULATORY_CARE_PROVIDER_SITE_OTHER): Payer: Self-pay | Admitting: *Deleted

## 2021-10-05 DIAGNOSIS — Z7901 Long term (current) use of anticoagulants: Secondary | ICD-10-CM

## 2021-10-05 DIAGNOSIS — I2693 Single subsegmental pulmonary embolism without acute cor pulmonale: Secondary | ICD-10-CM

## 2021-10-05 DIAGNOSIS — Z5181 Encounter for therapeutic drug level monitoring: Secondary | ICD-10-CM

## 2021-10-05 LAB — POCT INR: INR: 3 (ref 2.0–3.0)

## 2021-10-19 ENCOUNTER — Other Ambulatory Visit: Payer: Self-pay

## 2021-11-17 ENCOUNTER — Ambulatory Visit: Payer: Self-pay | Admitting: Family Medicine

## 2021-12-23 ENCOUNTER — Inpatient Hospital Stay (HOSPITAL_COMMUNITY): Payer: Self-pay | Attending: Hematology

## 2021-12-23 DIAGNOSIS — K589 Irritable bowel syndrome without diarrhea: Secondary | ICD-10-CM | POA: Insufficient documentation

## 2021-12-23 DIAGNOSIS — R0789 Other chest pain: Secondary | ICD-10-CM | POA: Insufficient documentation

## 2021-12-23 DIAGNOSIS — G35 Multiple sclerosis: Secondary | ICD-10-CM | POA: Insufficient documentation

## 2021-12-23 DIAGNOSIS — I2693 Single subsegmental pulmonary embolism without acute cor pulmonale: Secondary | ICD-10-CM | POA: Insufficient documentation

## 2021-12-23 DIAGNOSIS — Z8701 Personal history of pneumonia (recurrent): Secondary | ICD-10-CM | POA: Insufficient documentation

## 2021-12-23 DIAGNOSIS — Z7901 Long term (current) use of anticoagulants: Secondary | ICD-10-CM | POA: Insufficient documentation

## 2021-12-23 DIAGNOSIS — Z800 Family history of malignant neoplasm of digestive organs: Secondary | ICD-10-CM | POA: Insufficient documentation

## 2021-12-23 DIAGNOSIS — J45909 Unspecified asthma, uncomplicated: Secondary | ICD-10-CM | POA: Insufficient documentation

## 2021-12-23 DIAGNOSIS — Z803 Family history of malignant neoplasm of breast: Secondary | ICD-10-CM | POA: Insufficient documentation

## 2021-12-23 LAB — ANTITHROMBIN III: AntiThromb III Func: 107 % (ref 75–120)

## 2021-12-23 LAB — D-DIMER, QUANTITATIVE: D-Dimer, Quant: 1.17 ug{FEU}/mL — ABNORMAL HIGH (ref 0.00–0.50)

## 2021-12-24 LAB — DRVVT CONFIRM: dRVVT Confirm: 1.3 ratio — ABNORMAL HIGH (ref 0.8–1.2)

## 2021-12-24 LAB — PROTEIN C, TOTAL: Protein C, Total: 147 % (ref 60–150)

## 2021-12-24 LAB — PROTEIN C ACTIVITY: Protein C Activity: 167 % (ref 73–180)

## 2021-12-24 LAB — BETA-2-GLYCOPROTEIN I ABS, IGG/M/A
Beta-2 Glyco I IgG: <9 GPI IgG units (ref 0–20)
Beta-2-Glycoprotein I IgA: <9 GPI IgA units (ref 0–25)
Beta-2-Glycoprotein I IgM: <9 GPI IgM units (ref 0–32)

## 2021-12-24 LAB — LUPUS ANTICOAGULANT PANEL
DRVVT: 48.5 s — ABNORMAL HIGH (ref 0.0–47.0)
PTT Lupus Anticoagulant: 38.8 s (ref 0.0–43.5)

## 2021-12-24 LAB — PROTEIN S ACTIVITY: Protein S Activity: 137 % (ref 63–140)

## 2021-12-24 LAB — DRVVT MIX: dRVVT Mix: 42.3 s — ABNORMAL HIGH (ref 0.0–40.4)

## 2021-12-24 LAB — PROTEIN S, TOTAL: Protein S Ag, Total: 123 % (ref 60–150)

## 2021-12-26 LAB — CARDIOLIPIN ANTIBODIES, IGG, IGM, IGA
Anticardiolipin IgA: <9 APL U/mL (ref 0–11)
Anticardiolipin IgG: <9 GPL U/mL (ref 0–14)
Anticardiolipin IgM: 15 MPL U/mL — ABNORMAL HIGH (ref 0–12)

## 2021-12-27 LAB — PROTHROMBIN GENE MUTATION

## 2021-12-27 LAB — FACTOR 5 LEIDEN

## 2021-12-30 ENCOUNTER — Inpatient Hospital Stay (HOSPITAL_BASED_OUTPATIENT_CLINIC_OR_DEPARTMENT_OTHER): Payer: Self-pay | Admitting: Hematology

## 2021-12-30 ENCOUNTER — Other Ambulatory Visit: Payer: Self-pay

## 2021-12-30 VITALS — BP 148/88 | HR 77 | Temp 98.5°F | Resp 18 | Wt 225.8 lb

## 2021-12-30 DIAGNOSIS — I2693 Single subsegmental pulmonary embolism without acute cor pulmonale: Secondary | ICD-10-CM

## 2021-12-30 NOTE — Patient Instructions (Signed)
[place] at [facility] Discharge Instructions  You were seen and examined today by Dr. Delton Coombes. He reviewed your most recent labs and some of the lupus test was positive and your d-dimer is elevated. Dr. Delton Coombes recommends rechecking these levels in 3 months. If you have any symptoms like you had when you had the blood clot please go to the emergency department.  Please keep follow up appointment as scheduled in 3 months.   Thank you for choosing [place] [facility] to provide your oncology and hematology care.  To afford each patient quality time with our provider, please arrive at least 15 minutes before your scheduled appointment time.   If you have a lab appointment with the [facility] please come in thru the Main Entrance and check in at the main information desk.  You need to re-schedule your appointment should you arrive 10 or more minutes late.  We strive to give you quality time with our providers, and arriving late affects you and other patients whose appointments are after yours.  Also, if you no show three or more times for appointments you may be dismissed from the clinic at the providers discretion.     Again, thank you for choosing [facility].  Our hope is that these requests will decrease the amount of time that you wait before being seen by our physicians.       _____________________________________________________________  Should you have questions after your visit to [facility], please contact our office at [phone] and follow the prompts.  Our office hours are 8:00 a.m. and 4:30 p.m. Monday - Friday.  Please note that voicemails left after 4:00 p.m. may not be returned until the following business day.  We are closed weekends and major holidays.  You do have access to a nurse 24-7, just call the main number to the clinic [phone] and do not press any options, hold on the line and a  nurse will answer the phone.    For prescription refill requests, have your pharmacy contact our office and allow 72 hours.    Due to Covid, you will need to wear a mask upon entering the hospital. If you do not have a mask, a mask will be given to you at the Main Entrance upon arrival. For doctor visits, patients may have 1 support person age 21 or older with them. For treatment visits, patients can not have anyone with them due to social distancing guidelines and our immunocompromised population.

## 2021-12-30 NOTE — Progress Notes (Signed)
[place], [place] [address]   CLINIC:  Medical Oncology/Hematology  PCP:  Kathyrn Drown, MD [place] / [place] [address]  [phone]  REASON FOR VISIT:  Follow-up for single subsegmental pulmonary embolism without acute cor pulmonale   PRIOR THERAPY: none  CURRENT THERAPY: Eliquis  INTERVAL HISTORY:  [name], a 61 y.o. female, returns for routine follow-up for her single subsegmental pulmonary embolism without acute cor pulmonale. Kristine Miller was last seen on 09/24/2021.  Today she reports feeling good, and she is accompanied by her son. She stopped coumadin on 12/31. She denies SOB, and her breathing is at baseline.   REVIEW OF SYSTEMS:  Review of Systems  Constitutional:  Negative for appetite change and fatigue.  Respiratory:  Negative for shortness of breath.   All other systems reviewed and are negative.  PAST MEDICAL/SURGICAL HISTORY:  Past Medical History:  Diagnosis Date   Anxiety    Asthma    Chronic headaches    IBS (irritable bowel syndrome)    MS (multiple sclerosis) (HCC)    Palpitations    Pneumonia    Rotator cuff arthropathy, left    Past Surgical History:  Procedure Laterality Date   ABDOMINAL HYSTERECTOMY     APPENDECTOMY     CESAREAN SECTION     CHOLECYSTECTOMY     plate and pin in right arm     ROTATOR CUFF REPAIR      SOCIAL HISTORY:  Social History   Socioeconomic History   Marital status: Married    Spouse name: Not on file   Number of children: Not on file   Years of education: Not on file   Highest education level: Not on file  Occupational History   Not on file  Tobacco Use   Smoking status: Never   Smokeless tobacco: Never  Substance and Sexual Activity   Alcohol use: No   Drug use: No   Sexual activity: Yes    Birth control/protection: Surgical    Comment: hyst  Other Topics Concern   Not on file  Social History Narrative   Homemaker. Does not work  outside the home.   Social Determinants of Health   Financial Resource Strain: Not on file  Food Insecurity: Not on file  Transportation Needs: Not on file  Physical Activity: Not on file  Stress: Not on file  Social Connections: Not on file  Intimate Partner Violence: Not on file    FAMILY HISTORY:  Family History  Problem Relation Age of Onset   Cancer Mother    Diabetes Mother    Diabetes type I Daughter    Other Daughter        stomach issue   Cancer Maternal Grandmother    Cancer Maternal Grandfather    Congestive Heart Failure Maternal Grandfather    Other Sister        blood clot in leg   Seizures Son     CURRENT MEDICATIONS:  Current Outpatient Medications  Medication Sig Dispense Refill   Blood Pressure KIT 1 Bag by Does not apply route daily. 1 kit 0   NON FORMULARY Apply 1 application topically daily as needed (affected area/pain). CBD oil     warfarin (COUMADIN) 5 MG tablet Take 1 tablet (5 mg total) by mouth daily. Start on 09/13/21 30 tablet 1   No current facility-administered medications for this visit.    ALLERGIES:  Allergies  Allergen Reactions  Morphine And Related Swelling   Penicillins Nausea And Vomiting    Has patient had a PCN reaction causing immediate rash, facial/tongue/throat swelling, SOB or lightheadedness with hypotension: No Has patient had a PCN reaction causing severe rash involving mucus membranes or skin necrosis: No Has patient had a PCN reaction that required hospitalization No Has patient had a PCN reaction occurring within the last 10 years: No If all of the above answers are "NO", then may proceed with Cephalosporin use.    Prednisone Nausea And Vomiting    PHYSICAL EXAM:  Performance status (ECOG): 0 - Asymptomatic  Vitals:   12/30/21 1507 12/30/21 1509  BP: (!) 126/97 (!) 148/88  Pulse: 77   Resp: 18   Temp: 98.5 F (36.9 C)   SpO2: 97%    Wt Readings from Last 3 Encounters:  12/30/21 225 lb 12 oz (102.4  kg)  09/24/21 235 lb (106.6 kg)  09/13/21 230 lb (104.3 kg)   Physical Exam Vitals reviewed.  Constitutional:      Appearance: Normal appearance. She is obese.  Cardiovascular:     Rate and Rhythm: Normal rate and regular rhythm.     Pulses: Normal pulses.     Heart sounds: Normal heart sounds.  Pulmonary:     Effort: Pulmonary effort is normal.     Breath sounds: Normal breath sounds.  Neurological:     General: No focal deficit present.     Mental Status: She is alert and oriented to person, place, and time.  Psychiatric:        Mood and Affect: Mood normal.        Behavior: Behavior normal.    LABORATORY DATA:  I have reviewed the labs as listed.  CBC Latest Ref Rng & Units 08/21/2021 08/18/2021 08/17/2021  WBC 4.0 - 10.5 K/uL 13.4(H) 12.8(H) 13.8(H)  Hemoglobin 12.0 - 15.0 g/dL 15.4(H) 13.9 14.7  Hematocrit 36.0 - 46.0 % 46.7(H) 41.6 44.4  Platelets 150 - 400 K/uL 406(H) 393 411(H)   CMP Latest Ref Rng & Units 08/21/2021 08/18/2021 08/18/2021  Glucose 70 - 99 mg/dL 104(H) 105(H) 106(H)  BUN 6 - 20 mg/dL _0 Creatinine 0.44 - 1.00 mg/dL 0.90 0.80 0.82  Sodium 135 - 145 mmol/L 140 139 139  Potassium 3.5 - 5.1 mmol/L 4.1 3.5 3.4(L)  Chloride 98 - 111 mmol/L 104 106 106  CO2 22 - 32 mmol/L _1 Calcium 8.9 - 10.3 mg/dL 9.2 9.0 9.0  Total Protein 6.5 - 8.1 g/dL - 6.7 6.8  Total Bilirubin 0.3 - 1.2 mg/dL - 0.6 0.6  Alkaline Phos 38 - 126 U/L - 77 76  AST 15 - 41 U/L - 15 14(L)  ALT 0 - 44 U/L - 14 15      Component Value Date/Time   RBC 5.36 (H) 08/21/2021 2243   MCV 87.1 08/21/2021 2243   MCH 28.7 08/21/2021 2243   MCHC 33.0 08/21/2021 2243   RDW 13.6 08/21/2021 2243   LYMPHSABS 3.4 08/21/2021 2243   MONOABS 0.9 08/21/2021 2243   EOSABS 0.1 08/21/2021 2243   BASOSABS 0.1 08/21/2021 2243    DIAGNOSTIC IMAGING:  I have independently reviewed the scans and discussed with the patient. No results found.   ASSESSMENT:  Unprovoked small subsegmental  pulmonary embolism: - Presentation to the ER with left chest pain and shortness of breath. - CT angiogram on 08/18/2021 showed small subsegmental pulmonary embolus in a branch of the left upper lobe pulmonary artery. -  Ultrasound of the lower extremities was negative for DVT on 08/18/2021. - She was started on Eliquis. - Went to [facility] on 08/22/2021 with worsening left chest pain. - CT angiogram on 08/22/2021-no pulmonary embolus identified; distal lower lobe branches are degraded by motion. - She is transitioned to warfarin after a month of Eliquis.  She does not report any chest pain at this time. 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  No prior history of DVT. - She never had colonoscopy.  Last mammogram on 03/08/2017, benign. - She has diagnosis of MS which presents as a pain and numbness in the legs, trouble swallowing and slurred speech and decreased concentration.  She is currently on CBD oils.    Social/family history: - She lives at home.  She is currently not working.  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  She is a non-smoker. - Mother had right leg DVT which was provoked after a long road trip, received limited duration anticoagulation. - Sister had clot in her heart, has been on warfarin for 1 year. - Maternal grandmother had pancreatic cancer.  Mother had colon cancer.  Maternal uncle had throat cancer.   PLAN:  Unprovoked small subsegmental pulmonary embolism: - She has stopped warfarin on 11/13/2021. - She denies any chest pain or shortness of breath. - We reviewed blood work from 12/23/2021.  Lupus anticoagulant was positive.  Antibeta 2 glycoprotein 1 and anticardiolipin antibodies were negative. - Factor V Leiden, prothrombin gene mutation, protein C and S were normal. - D-dimer was elevated at 1.17.  Upon further review, she had elevated D-dimer since 2010. - As she had a negative CT angiogram on 08/22/2021, and she is asymptomatic, we can watch her at  this time.  She was told to go to the ER if she develops any similar symptoms prior to her pulmonary embolism. - Recommend repeating lupus anticoagulant, anticardiolipin antibody and D-dimer prior to next visit in 3 months. 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  Orders placed this encounter:  No orders of the defined types were placed in this encounter.    Derek Jack, MD [place] [phone]   I, Thana Ates, am acting as a scribe for Dr. Derek Jack.  I, Derek Jack MD, have reviewed the above documentation for accuracy and completeness, and I agree with the above.

## 2022-01-07 ENCOUNTER — Encounter: Payer: Self-pay | Admitting: Family Medicine

## 2022-02-14 ENCOUNTER — Other Ambulatory Visit: Payer: Self-pay

## 2022-02-14 ENCOUNTER — Emergency Department (HOSPITAL_COMMUNITY): Payer: Self-pay

## 2022-02-14 ENCOUNTER — Encounter (HOSPITAL_COMMUNITY): Payer: Self-pay

## 2022-02-14 ENCOUNTER — Emergency Department (HOSPITAL_COMMUNITY)
Admission: EM | Admit: 2022-02-14 | Discharge: 2022-02-14 | Disposition: A | Discharge status: Home / Self Care | Payer: Self-pay | Attending: Emergency Medicine | Admitting: Emergency Medicine

## 2022-02-14 DIAGNOSIS — Z7901 Long term (current) use of anticoagulants: Secondary | ICD-10-CM | POA: Insufficient documentation

## 2022-02-14 DIAGNOSIS — R079 Chest pain, unspecified: Secondary | ICD-10-CM | POA: Insufficient documentation

## 2022-02-14 DIAGNOSIS — R0602 Shortness of breath: Secondary | ICD-10-CM | POA: Insufficient documentation

## 2022-02-14 LAB — BASIC METABOLIC PANEL
Anion gap: 7 (ref 5–15)
BUN: 14 mg/dL (ref 6–20)
CO2: 27 mmol/L (ref 22–32)
Calcium: 9.2 mg/dL (ref 8.9–10.3)
Chloride: 107 mmol/L (ref 98–111)
Creatinine, Ser: 0.72 mg/dL (ref 0.44–1.00)
GFR, Estimated: >60 mL/min (ref >60)
Glucose, Bld: 118 mg/dL — ABNORMAL HIGH (ref 70–99)
Potassium: 3.6 mmol/L (ref 3.5–5.1)
Sodium: 141 mmol/L (ref 135–145)

## 2022-02-14 LAB — CBC
HCT: 44.4 % (ref 36.0–46.0)
Hemoglobin: 14.4 g/dL (ref 12.0–15.0)
MCH: 28.9 pg (ref 26.0–34.0)
MCHC: 32.4 g/dL (ref 30.0–36.0)
MCV: 89.2 fL (ref 80.0–100.0)
Platelets: 348 10*3/uL (ref 150–400)
RBC: 4.98 MIL/uL (ref 3.87–5.11)
RDW: 14 % (ref 11.5–15.5)
WBC: 8.9 10*3/uL (ref 4.0–10.5)
nRBC: 0 % (ref 0.0–0.2)

## 2022-02-14 LAB — TROPONIN I (HIGH SENSITIVITY)
Troponin I (High Sensitivity): 3 ng/L (ref <18)
Troponin I (High Sensitivity): 3 ng/L (ref <18)

## 2022-02-14 LAB — BRAIN NATRIURETIC PEPTIDE: B Natriuretic Peptide: 31 pg/mL (ref 0.0–100.0)

## 2022-02-14 MED ORDER — IOHEXOL 350 MG/ML SOLN
75.0000 mL | Freq: Once | INTRAVENOUS | Status: AC | PRN
  Administered 2022-02-14: 75 mL via INTRAVENOUS

## 2022-02-14 NOTE — ED Triage Notes (Signed)
Pt c/o chest pain that started this morning. States pain radiates to left side of neck and reports some numbness to left arm.  ?

## 2022-02-14 NOTE — ED Provider Notes (Signed)
?[place] ?Provider Note ? ? ?CSN: [id number] ?Arrival date & time: 02/14/22  5825 ? ?  ? ?History ? ?Chief Complaint  ?Patient presents with  ? Chest Pain  ? ? ?Kristine Miller is a 61 y.o. female. ? ? ?Chest Pain ? ?Patient with medical history notable for MS, QT prolongation, history of PE 1 year ago no longer anticoagulated presents today due to chest pain.  Started this morning at 630, its mostly constant and sometimes feels sharp.  On the left side of her chest and radiates to her left arm.  Her left arm feels like "she fell asleep on it".  She does not feel short of breath, the pain is not worse with inspiration.  No recent travel or surgeries.  She took 2 aspirins which did help alleviate the pain somewhat. ? ?Patient's dimer appears chronically elevated per chart review.  She had a dimer drawn 1 month ago that was elevated at 1.17. ? ?Home Medications ?Prior to Admission medications   ?Medication Sig Start Date End Date Taking? Authorizing Provider  ?NON FORMULARY Apply 1 application topically daily as needed (affected area/pain). CBD oil   Yes [provider]  ?Blood Pressure KIT 1 Bag by Does not apply route daily. 08/18/21   Shahmehdi, Valeria Batman, MD  ?warfarin (COUMADIN) 5 MG tablet Take 1 tablet (5 mg total) by mouth daily. Start on 09/13/21 ?Patient not taking: Reported on 02/14/2022 09/10/21   Kathyrn Drown, MD  ?   ? ?Allergies    ?Morphine and related, Penicillins, and Prednisone   ? ?Review of Systems   ?Review of Systems  ?Cardiovascular:  Positive for chest pain.  ? ?Physical Exam ?Updated Vital Signs ?BP (!) 147/76   Pulse 79   Temp 98.1 ?F (36.7 ?C)   Resp 17   Ht 5' (1.524 m)   Wt 103 kg   SpO2 98%   BMI 44.33 kg/m?  ?Physical Exam ?Vitals and nursing note reviewed. Exam conducted with a chaperone present.  ?Constitutional:   ?   Appearance: Normal appearance. She is obese.  ?HENT:  ?   Head: Normocephalic and atraumatic.  ?Eyes:  ?   General: No scleral  icterus.    ?   Right eye: No discharge.     ?   Left eye: No discharge.  ?   Extraocular Movements: Extraocular movements intact.  ?   Pupils: Pupils are equal, round, and reactive to light.  ?Cardiovascular:  ?   Rate and Rhythm: Normal rate and regular rhythm.  ?   Pulses: Normal pulses.  ?   Heart sounds: Normal heart sounds. No murmur heard. ?  No friction rub. No gallop.  ?Pulmonary:  ?   Effort: Pulmonary effort is normal. No respiratory distress.  ?   Breath sounds: Normal breath sounds.  ?Chest:  ?   Chest wall: No tenderness.  ?Abdominal:  ?   General: Abdomen is flat. Bowel sounds are normal. There is no distension.  ?   Palpations: Abdomen is soft.  ?   Tenderness: There is no abdominal tenderness.  ?Musculoskeletal:  ?   Right lower leg: No edema.  ?   Left lower leg: No edema.  ?   Comments: Lower extremities roughly symmetric, no calf tenderness, no tenderness behind the knee, no erythema or warmth   ?Skin: ?   General: Skin is warm and dry.  ?   Coloration: Skin is not jaundiced.  ?Neurological:  ?  Mental Status: She is alert. Mental status is at baseline.  ?   Coordination: Coordination normal.  ? ? ?ED Results / Procedures / Treatments   ?Labs ?(all labs ordered are listed, but only abnormal results are displayed) ?Labs Reviewed  ?BASIC METABOLIC PANEL - Abnormal; Notable for the following components:  ?    Result Value  ? Glucose, Bld 118 (*)   ? All other components within normal limits  ?CBC  ?BRAIN NATRIURETIC PEPTIDE  ?TROPONIN I (HIGH SENSITIVITY)  ?TROPONIN I (HIGH SENSITIVITY)  ? ? ?EKG ?EKG Interpretation ? ?Date/Time:  Monday February 14 2022 09:53:37 EDT ?Ventricular Rate:  81 ?PR Interval:  158 ?QRS Duration: 100 ?QT Interval:  372 ?QTC Calculation: 432 ?R Axis:   -24 ?Text Interpretation: Sinus rhythm Borderline left axis deviation no significant change since earlier in the day Confirmed by Sherwood Gambler [id number]) on 02/14/2022 11:01:50 AM ? ?Radiology ?DG Chest 2 View ? ?Result Date:  02/14/2022 ?CLINICAL DATA:  Chest pain EXAM: CHEST - 2 VIEW COMPARISON:  Chest radiograph dated August 22, 2021 FINDINGS: The heart size and mediastinal contours are within normal limits. Low lung volumes. No focal consolidation or pleural effusion. The visualized skeletal structures are unremarkable. IMPRESSION: No active cardiopulmonary disease. Electronically Signed   By: Keane Police D.O.   On: 02/14/2022 10:21  ? ?CT Angio Chest PE W/Cm &/Or Wo Cm ? ?Result Date: 02/14/2022 ?CLINICAL DATA:  Chest pain and left arm numbness since Friday. History of blood clots. EXAM: CT ANGIOGRAPHY CHEST WITH CONTRAST TECHNIQUE: Multidetector CT imaging of the chest was performed using the standard protocol during bolus administration of intravenous contrast. Multiplanar CT image reconstructions and MIPs were obtained to evaluate the vascular anatomy. RADIATION DOSE REDUCTION: This exam was performed according to the departmental dose-optimization program which includes automated exposure control, adjustment of the mA and/or kV according to patient size and/or use of iterative reconstruction technique. CONTRAST:  49m OMNIPAQUE IOHEXOL 350 MG/ML SOLN COMPARISON:  Chest x-ray from same day. CT chest dated August 22, 2021. FINDINGS: Cardiovascular: Satisfactory opacification of the pulmonary arteries to the segmental level. No evidence of pulmonary embolism. Normal heart size. No pericardial effusion. No thoracic aortic aneurysm or dissection. Mediastinum/Nodes: No enlarged mediastinal, hilar, or axillary lymph nodes. Thyroid gland, trachea, and esophagus demonstrate no significant findings. Lungs/Pleura: No focal consolidation, pleural effusion, or pneumothorax. Upper Abdomen: No acute abnormality.  Prior cholecystectomy. Musculoskeletal: No chest wall abnormality. No acute or significant osseous findings. Review of the MIP images confirms the above findings. IMPRESSION: 1. No evidence of pulmonary embolism. No acute intrathoracic  process. Electronically Signed   By: WTitus DubinM.D.   On: 02/14/2022 13:08   ? ?Procedures ?Procedures  ? ? ?Medications Ordered in ED ?Medications  ?iohexol (OMNIPAQUE) 350 MG/ML injection 75 mL (75 mLs Intravenous Contrast Given 02/14/22 1242)  ? ? ?ED Course/ Medical Decision Making/ A&P ?  ?                        ?Medical Decision Making ?Amount and/or Complexity of Data Reviewed ?Labs: ordered. ?Radiology: ordered. ? ?Risk ?Prescription drug management. ? ? ?This patient presents to the ED for concern of chest pain, this involves an extensive number of treatment options, and is a complaint that carries with it a high risk of complications and morbidity.  The differential diagnosis includes but is not limited to ACS, PE, pneumonia, pneumothorax, pericarditis ? ?Patient?s presentation is complicated by their history of  prior PE resulting in anticoagulation, not currently anticoagulated but cannot PERC out. ? ? ?Additional history obtained:  ? ?Reviewed patient's chart, patient's dimer is trended high for the past few months.  Most recent was in February. ? ?  ?Lab Tests: ? ?I ordered, viewed, and personally interpreted labs.  The pertinent results include ?:-Negative delta Trope. ?- CBC without leukocytosis or anemia. ?- BMP without gross electrolyte derangement, no AKI. ?- BNP unremarkable. ? ?  ?Imaging Studies ordered: ? ?I directly visualized the chest x-ray and CTA PE study, which showed no underlying pneumonia, acute process or PE ? ?I agree with the radiologist interpretation ?  ? ?ECG/Cardiac monitoring:  ? ?Per my interpretation, EKG shows sinus rhythm, no changes compared to prior ? ?The patient was maintained on a cardiac monitor.  Visualized monitor strip which showed sinus rhythm, heart rate 71 bpm per my interpretation.  ? ? ?Medicines ordered and prescription drug management: ? ?I have reviewed the patients home medicines and have made adjustments as needed ? ? ?Test Considered: ? ?Heart score  3, considered ACS but given negative delta troponin and no new ischemic findings on EKG I think this is unlikely. ? ?Pain is improved, consider hospitalization but ultimately I feel patient is appropriate for outpatie

## 2022-02-14 NOTE — Discharge Instructions (Signed)
Workup today was reassuring.  No signs of blood clots or heart attack, unsure the source of the pain but I do not think is anything life-threatening.  Please follow-up with your primary care doctor this week for reevaluation, return to the ED if things change or worsen. ?

## 2022-03-31 ENCOUNTER — Inpatient Hospital Stay (HOSPITAL_COMMUNITY): Payer: Self-pay | Attending: Hematology

## 2022-03-31 DIAGNOSIS — K589 Irritable bowel syndrome without diarrhea: Secondary | ICD-10-CM | POA: Insufficient documentation

## 2022-03-31 DIAGNOSIS — R059 Cough, unspecified: Secondary | ICD-10-CM | POA: Insufficient documentation

## 2022-03-31 DIAGNOSIS — Z7901 Long term (current) use of anticoagulants: Secondary | ICD-10-CM | POA: Insufficient documentation

## 2022-03-31 DIAGNOSIS — J45909 Unspecified asthma, uncomplicated: Secondary | ICD-10-CM | POA: Insufficient documentation

## 2022-03-31 DIAGNOSIS — I2699 Other pulmonary embolism without acute cor pulmonale: Secondary | ICD-10-CM | POA: Insufficient documentation

## 2022-03-31 DIAGNOSIS — G35 Multiple sclerosis: Secondary | ICD-10-CM | POA: Insufficient documentation

## 2022-03-31 DIAGNOSIS — Z809 Family history of malignant neoplasm, unspecified: Secondary | ICD-10-CM | POA: Insufficient documentation

## 2022-03-31 DIAGNOSIS — I2693 Single subsegmental pulmonary embolism without acute cor pulmonale: Secondary | ICD-10-CM

## 2022-03-31 LAB — D-DIMER, QUANTITATIVE: D-Dimer, Quant: 0.9 ug/mL-FEU — ABNORMAL HIGH (ref 0.00–0.50)

## 2022-04-02 LAB — LUPUS ANTICOAGULANT PANEL
DRVVT: 42.7 s (ref 0.0–47.0)
PTT Lupus Anticoagulant: 36 s (ref 0.0–43.5)

## 2022-04-03 LAB — CARDIOLIPIN ANTIBODIES, IGG, IGM, IGA
Anticardiolipin IgA: <9 APL U/mL (ref 0–11)
Anticardiolipin IgG: <9 GPL U/mL (ref 0–14)
Anticardiolipin IgM: <9 MPL U/mL (ref 0–12)

## 2022-04-06 NOTE — Progress Notes (Signed)
Kristine Miller, [place] [id number]   CLINIC:  Medical Oncology/Hematology  PCP:  Kathyrn Drown, MD [address] [place] [address] [phone]   REASON FOR VISIT:  Follow-up for pulmonary embolism  PRIOR THERAPY: Eliquis, warfarin  CURRENT THERAPY: None  INTERVAL HISTORY:  Kristine Miller 61 y.o. female returns for routine follow-up of her history of pulmonary embolism. She was last seen by Dr. Delton Coombes on 12/30/2021. At today's visit, she reports feeling well. She went to the ED on 02/14/2022 due to symptoms of chest pain.  CTA chest obtained at that time was negative for PE. She has been off of blood thinner since December 2022. She has not had any unilateral leg swelling, pain, or erythema. She has occasional chest pain which is part of her constellation of MS symptoms. She denies any shortness of breath, dyspnea on exertion, hemoptysis, or palpitations. She does have occasional dry cough, which she relates to seasonal allergies. Her other MS symptoms include bilateral leg pain, numbness and tingling, and muscle weakness. She has 70% energy and 70% appetite. She endorses that she is maintaining a stable weight.   REVIEW OF SYSTEMS:  Review of Systems  Constitutional:  Positive for fatigue. Negative for appetite change, chills, diaphoresis, fever and unexpected weight change.  HENT:   Positive for trouble swallowing. Negative for lump/mass and nosebleeds.   Eyes:  Negative for eye problems.  Respiratory:  Positive for cough (occasional dry cough). Negative for hemoptysis and shortness of breath.   Cardiovascular:  Positive for palpitations. Negative for chest pain and leg swelling.  Gastrointestinal:  Negative for abdominal pain, blood in stool, constipation, diarrhea, nausea and vomiting.  Genitourinary:  Negative for hematuria.   Musculoskeletal:  Positive for arthralgias and myalgias.  Skin: Negative.   Neurological:   Positive for numbness. Negative for dizziness, headaches and light-headedness.  Hematological:  Does not bruise/bleed easily.  Psychiatric/Behavioral:  Positive for sleep disturbance.      PAST MEDICAL/SURGICAL HISTORY:  Past Medical History:  Diagnosis Date   Anxiety    Asthma    Chronic headaches    IBS (irritable bowel syndrome)    MS (multiple sclerosis) (HCC)    Palpitations    Pneumonia    Rotator cuff arthropathy, left    Past Surgical History:  Procedure Laterality Date   ABDOMINAL HYSTERECTOMY     APPENDECTOMY     CESAREAN SECTION     CHOLECYSTECTOMY     plate and pin in right arm     ROTATOR CUFF REPAIR       SOCIAL HISTORY:  Social History   Socioeconomic History   Marital status: Married    Spouse name: Not on file   Number of children: Not on file   Years of education: Not on file   Highest education level: Not on file  Occupational History   Not on file  Tobacco Use   Smoking status: Never   Smokeless tobacco: Never  Substance and Sexual Activity   Alcohol use: No   Drug use: No   Sexual activity: Yes    Birth control/protection: Surgical    Comment: hyst  Other Topics Concern   Not on file  Social History Narrative   Homemaker. Does not work outside the home.   Social Determinants of Health   Financial Resource Strain: Not on file  Food Insecurity: Not on file  Transportation Needs: Not on file  Physical Activity: Not on file  Stress: Not on file  Social Connections: Not on file  Intimate Partner Violence: Not on file    FAMILY HISTORY:  Family History  Problem Relation Age of Onset   Cancer Mother    Diabetes Mother    Diabetes type I Daughter    Other Daughter        stomach issue   Cancer Maternal Grandmother    Cancer Maternal Grandfather    Congestive Heart Failure Maternal Grandfather    Other Sister        blood clot in leg   Seizures Son     CURRENT MEDICATIONS:  Outpatient Encounter Medications as of 04/07/2022   Medication Sig   Blood Pressure KIT 1 Bag by Does not apply route daily.   NON FORMULARY Apply 1 application topically daily as needed (affected area/pain). CBD oil   warfarin (COUMADIN) 5 MG tablet Take 1 tablet (5 mg total) by mouth daily. Start on 09/13/21 (Patient not taking: Reported on 02/14/2022)   No facility-administered encounter medications on file as of 04/07/2022.    ALLERGIES:  Allergies  Allergen Reactions   Morphine And Related Swelling   Penicillins Nausea And Vomiting    Has patient had a PCN reaction causing immediate rash, facial/tongue/throat swelling, SOB or lightheadedness with hypotension: No Has patient had a PCN reaction causing severe rash involving mucus membranes or skin necrosis: No Has patient had a PCN reaction that required hospitalization No Has patient had a PCN reaction occurring within the last 10 years: No If all of the above answers are "NO", then may proceed with Cephalosporin use.    Prednisone Nausea And Vomiting     PHYSICAL EXAM:  ECOG PERFORMANCE STATUS: 1 - Symptomatic but completely ambulatory  There were no vitals filed for this visit. There were no vitals filed for this visit. Physical Exam Constitutional:      Appearance: Normal appearance. She is obese.  HENT:     Head: Normocephalic and atraumatic.     Mouth/Throat:     Mouth: Mucous membranes are moist.  Eyes:     Extraocular Movements: Extraocular movements intact.     Pupils: Pupils are equal, round, and reactive to light.  Cardiovascular:     Rate and Rhythm: Normal rate and regular rhythm.     Pulses: Normal pulses.     Heart sounds: Normal heart sounds.  Pulmonary:     Effort: Pulmonary effort is normal.     Breath sounds: Normal breath sounds.  Abdominal:     General: Bowel sounds are normal.     Palpations: Abdomen is soft.     Tenderness: There is no abdominal tenderness.  Musculoskeletal:        General: No swelling.     Right lower leg: No edema.      Left lower leg: No edema.  Lymphadenopathy:     Cervical: No cervical adenopathy.  Skin:    General: Skin is warm and dry.  Neurological:     General: No focal deficit present.     Mental Status: She is alert and oriented to person, place, and time.  Psychiatric:        Mood and Affect: Mood normal.        Behavior: Behavior normal.     LABORATORY DATA:  I have reviewed the labs as listed.  CBC    Component Value Date/Time   WBC 8.9 02/14/2022 1019   RBC 4.98 02/14/2022 1019   HGB 14.4 02/14/2022 1019  HCT 44.4 02/14/2022 1019   PLT 348 02/14/2022 1019   MCV 89.2 02/14/2022 1019   MCH 28.9 02/14/2022 1019   MCHC 32.4 02/14/2022 1019   RDW 14.0 02/14/2022 1019   LYMPHSABS 3.4 08/21/2021 2243   MONOABS 0.9 08/21/2021 2243   EOSABS 0.1 08/21/2021 2243   BASOSABS 0.1 08/21/2021 2243      Latest Ref Rng & Units 02/14/2022   10:19 AM 08/21/2021   10:43 PM 08/18/2021    4:29 AM  CMP  Glucose 70 - 99 mg/dL 118   104   105     106    BUN 6 - 20 mg/dL $Remove'14   8   10     10    'vDFNzqz$ Creatinine 0.44 - 1.00 mg/dL 0.72   0.90   0.80     0.82    Sodium 135 - 145 mmol/L 141   140   139     139    Potassium 3.5 - 5.1 mmol/L 3.6   4.1   3.5     3.4    Chloride 98 - 111 mmol/L 107   104   106     106    CO2 22 - 32 mmol/L $RemoveB'27   27   26     26    'olfZwGOy$ Calcium 8.9 - 10.3 mg/dL 9.2   9.2   9.0     9.0    Total Protein 6.5 - 8.1 g/dL   6.7     6.8    Total Bilirubin 0.3 - 1.2 mg/dL   0.6     0.6    Alkaline Phos 38 - 126 U/L   77     76    AST 15 - 41 U/L   15     14    ALT 0 - 44 U/L   14     15      DIAGNOSTIC IMAGING:  I have independently reviewed the relevant imaging and discussed with the patient.  ASSESSMENT & PLAN: 1.  Unprovoked small subsegmental pulmonary embolism - Presentation to the ER on 08/18/2021 with left chest pain and shortness of breath. - CT angiogram on 08/18/2021 showed small subsegmental pulmonary embolus in a branch of the left upper lobe pulmonary artery. -  Ultrasound of the lower extremities was negative for DVT on 08/18/2021. - She was started on Eliquis. - Went to [facility] on 08/22/2021 with worsening left chest pain. - CT angiogram on 08/22/2021-no pulmonary embolus identified; distal lower lobe branches are degraded by motion. - She transitioned to warfarin after a month of Eliquis.  She stopped warfarin as of 11/13/2021. 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  No prior history of DVT. - Blood work from 12/23/2021 -- Lupus anticoagulant was positive.  Antibeta 2 glycoprotein 1 and anticardiolipin antibodies were negative.  Factor V Leiden, prothrombin gene mutation, protein C and S were normal.  D-dimer was elevated at 1.17, but upon further review, she had elevated D-dimer since 2010. - Repeat testing showed lupus anticoagulant negative on 03/31/2022.  Anticardiolipin antibodies negative.  D-dimer remains mildly elevated at 0.90, but improved from previous. - Since she does not have persistently positive lupus anticoagulant, she does not meet criteria for antiphospholipid syndrome or lupus anticoagulant syndrome. - She denies any chest pain or shortness of breath. - PLAN: Since repeat lupus anticoagulant testing was  negative, there is no indication for indefinite anticoagulation at this time. - Since she had a negative CT angiogram on 08/22/2021 and again on 02/14/2022, and is asymptomatic, she can continue without anticoagulation at this time. - She was told to go to the ER if she develops any similar symptoms to her previous pulmonary embolism. - She can follow with [place] on an as-needed basis.  She is aware that if she has any DVT or PE in the future, she should return to [place] for additional consultation and indefinite anticoagulation.  2.  Other history - She never had colonoscopy.  Last mammogram on 03/08/2017, benign. - She has diagnosis of MS  which presents as a pain and numbness in the legs, trouble swallowing and slurred speech and decreased concentration.  She is currently on CBD oils. - She lives at home.  She is currently not working.  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  She is a non-smoker. - Mother had right leg DVT which was provoked after a long road trip, received limited duration anticoagulation. - Sister had clot in her heart, has been on warfarin for 1 year. - Maternal grandmother had pancreatic cancer.  Mother had colon cancer.  Maternal uncle had throat cancer.   All questions were answered. The patient knows to call the clinic with any problems, questions or concerns.  Medical decision making: Low  Time spent on visit: I spent 20 minutes counseling the patient face to face. The total time spent in the appointment was 30 minutes and more than 50% was on counseling.   Harriett Rush, PA-C  04/07/2022 3:06 PM

## 2022-04-07 ENCOUNTER — Inpatient Hospital Stay (HOSPITAL_BASED_OUTPATIENT_CLINIC_OR_DEPARTMENT_OTHER): Payer: Self-pay | Admitting: Physician Assistant

## 2022-04-07 VITALS — BP 126/76 | HR 82 | Temp 97.5°F | Resp 18 | Ht 60.0 in | Wt 226.4 lb

## 2022-04-07 DIAGNOSIS — I2693 Single subsegmental pulmonary embolism without acute cor pulmonale: Secondary | ICD-10-CM

## 2022-04-07 NOTE — Patient Instructions (Signed)
[place] at [facility] Discharge Instructions  You were seen today by Tarri Abernethy PA-C for your history of pulmonary embolism (blood clot in your lungs).  Your lab test did NOT show any underlying blood disorders that cause to this blood clot.  Your most recent imaging studies did not show any residual blood clot.  Therefore, you do not need to be on any blood thinner at this time.  However, it is important for you to know that you are still at risk for recurrent blood clots in the future.  Make sure that you read the attached handouts for important information about the signs and symptoms of blood clots in your legs and in your lungs and seek IMMEDIATE medical attention if these symptoms occur.  At this time, you do not need to schedule any follow-up appointments with our office.  However, if you are diagnosed with any blood clots in the future, please make an appointment to see [place] again so that we can help to manage your ongoing blood thinner medications.    Thank you for choosing [place] at [facility] to provide your oncology and hematology care.  To afford each patient quality time with our provider, please arrive at least 15 minutes before your scheduled appointment time.   If you have a lab appointment with the [facility] please come in thru the Main Entrance and check in at the main information desk.  You need to re-schedule your appointment should you arrive 10 or more minutes late.  We strive to give you quality time with our providers, and arriving late affects you and other patients whose appointments are after yours.  Also, if you no show three or more times for appointments you may be dismissed from the clinic at the providers discretion.     Again, thank you for choosing [facility].  Our hope is that these requests will decrease the amount of time that you wait before being seen by our physicians.        _____________________________________________________________  Should you have questions after your visit to [facility], please contact our office at [phone] and follow the prompts.  Our office hours are 8:00 a.m. and 4:30 p.m. Monday - Friday.  Please note that voicemails left after 4:00 p.m. may not be returned until the following business day.  We are closed weekends and major holidays.  You do have access to a nurse 24-7, just call the main number to the clinic [phone] and do not press any options, hold on the line and a nurse will answer the phone.    For prescription refill requests, have your pharmacy contact our office and allow 72 hours.    Due to Covid, you will need to wear a mask upon entering the hospital. If you do not have a mask, a mask will be given to you at the Main Entrance upon arrival. For doctor visits, patients may have 1 support person age 80 or older with them. For treatment visits, patients can not have anyone with them due to social distancing guidelines and our immunocompromised population.

## 2022-05-03 ENCOUNTER — Telehealth: Payer: Self-pay | Admitting: *Deleted

## 2022-05-03 NOTE — Telephone Encounter (Signed)
Patient states she has MS and never knows if her legs will give out on her or get weak

## 2022-05-03 NOTE — Telephone Encounter (Signed)
Patient dropped off form for handicap plaquard- Dr Nicki Reaper requests clarification on why patient needs this and what is her qualifying reason- form at nurses station   Left message to return call

## 2022-05-04 NOTE — Telephone Encounter (Signed)
This was completed thank you 

## 2022-05-05 NOTE — Telephone Encounter (Signed)
LMTRC

## 2022-05-05 NOTE — Telephone Encounter (Signed)
Pt returned call and form giving to front desk for pickup.

## 2022-05-09 ENCOUNTER — Other Ambulatory Visit (HOSPITAL_COMMUNITY): Payer: Self-pay | Admitting: Family Medicine

## 2022-05-23 ENCOUNTER — Ambulatory Visit: Payer: Self-pay | Admitting: Nurse Practitioner

## 2022-05-26 ENCOUNTER — Ambulatory Visit (INDEPENDENT_AMBULATORY_CARE_PROVIDER_SITE_OTHER): Payer: Self-pay | Admitting: Nurse Practitioner

## 2022-05-26 ENCOUNTER — Encounter: Payer: Self-pay | Admitting: Nurse Practitioner

## 2022-05-26 VITALS — BP 122/80 | HR 76 | Temp 97.7°F | Ht 60.0 in | Wt 225.0 lb

## 2022-05-26 DIAGNOSIS — G35 Multiple sclerosis: Secondary | ICD-10-CM

## 2022-05-26 DIAGNOSIS — N644 Mastodynia: Secondary | ICD-10-CM

## 2022-05-26 DIAGNOSIS — G35D Multiple sclerosis, unspecified: Secondary | ICD-10-CM

## 2022-05-26 DIAGNOSIS — L988 Other specified disorders of the skin and subcutaneous tissue: Secondary | ICD-10-CM

## 2022-05-26 NOTE — Progress Notes (Signed)
Pt has appt scheduled for 07/21/22

## 2022-05-26 NOTE — Progress Notes (Signed)
Subjective:    Patient ID: Kristine Miller, female    DOB: 1961/04/20, 61 y.o.   MRN: [id number]  HPI  Left side breast pain for several months that comes and goes.  Patient states that the pain is near her left axilla.  Patient states that pain is aggravated when she presses on it.  Patient has history of dense breast tissue to bilateral breast however, patient denies seeing or feeling any new lumps.  Patient denies any nipple discharges or dimpling.  Patient does admit to having a mole that has grown in the lateral aspect of her left breast near her nipple.  Patient states that the area started out small but now has gotten larger.  Patient denies any bleeding from the lesion or discharge.  Patient has history of MS and states that she has not seen a neurology specialist since her diagnosis in 2014.  Patient would like to get in with a neurologist.  Patient does admit to having multiple relapses but states that she manages them at home.  Patient states that she did have left-sided weakness however the weakness has now migrated to her right side.  Patient also admits to having some vision changes and is being followed by ophthalmology.   Review of Systems  Skin:        Lesion to left breast.  Left breast pain       Objective:   Physical Exam Vitals reviewed.  Constitutional:      General: She is not in acute distress.    Appearance: Normal appearance. She is obese. She is not ill-appearing, toxic-appearing or diaphoretic.  HENT:     Head: Normocephalic and atraumatic.  Chest:  Breasts:    Right: Normal.     Left: Skin change and tenderness present. No swelling, bleeding, inverted nipple or nipple discharge.       Comments: Dense breast tissue palpated possibly obscuring any palpation of masses or lesions.  Area of tenderness noted to left axilla and tail of Spence.  Adenopathy to left axillary unable to be appreciated due to the density of breast tissue.   Musculoskeletal:      Comments: Grossly intact  Skin:    General: Skin is warm.     Capillary Refill: Capillary refill takes less than 2 seconds.  Neurological:     Mental Status: She is alert.     Comments: Grossly intact  Psychiatric:        Mood and Affect: Mood normal.        Behavior: Behavior normal.           Assessment & Plan:   1. Lesion of skin of breast -Unsure of etiology -However lesion to left breast looks suspicious for malignancy -We will refer to dermatology for further evaluation and conduct diagnostic mammogram and ultrasound - MM Digital Diagnostic Bilat - US BREAST LTD UNI LEFT INC AXILLA - US BREAST LTD UNI RIGHT INC AXILLA - Ambulatory referral to Dermatology -Return to clinic in 8 weeks  2. MS (multiple sclerosis) ([place]) -We will refer to neurology for patient to get establish with neurologist given her diagnosis of multiple sclerosis in 2014 - Ambulatory referral to Neurology  3. Breast pain -Unable to appreciate any lymphadenopathy due to the dense nature of patient's breast however suspect pain possibly secondary to lymphadenopathy. - MM Digital Diagnostic Bilat - US BREAST LTD UNI LEFT INC AXILLA - US BREAST LTD UNI RIGHT INC AXILLA -Return to clinic in 8 weeks  Note:  This document was prepared using Dragon voice recognition software and may include unintentional dictation errors. Note - This record has been created using [employer].  Chart creation errors have been sought, but may not always  have been located. Such creation errors do not reflect on  the standard of medical care.

## 2022-06-07 NOTE — Progress Notes (Unsigned)
[facility]  PATIENT: Kristine Miller DOB: 03/15/1961  REFERRING DOCTOR OR PCP: [name] MD SOURCE: Patient, notes from primary care, notes from [facility] neurology, imaging and lab reports, MRI images personally reviewed.  _________________________________   HISTORICAL  CHIEF COMPLAINT:  Chief Complaint  Patient presents with   New Patient (Initial Visit)    Rm 2, w husband. Pt here to establish care for her MS. Last MRI done in 2017 (in [place]) and is highly claustrophobic. Pt's MS has not been managed in over 5 years. Pt having trouble walking, pn in both legs and using CBD oil cream, which has helped a lot. Having trouble speaking and dropping things.     HISTORY OF PRESENT ILLNESS:  I have the pleasure of seeing your patient, Kristine Miller, at [facility] for a diagnosis of multiple sclerosis.   She is a 61 year old woman with the onset of leg weakness, numbness in October 2014 with residual gait issues who was diagnosed with MS at the time   In 2014, she went to the ED due to reduced sensation in her legs.  She felt gait was off balanced.   She also had chest pain at the time,  She had an MRI showing some non-specific white matter foci and was told she had MS.   She also had an LP and she reports being told CSF was abnormal but I reviewed the results and they actually show no oligoclonal bands and a normal IgG index.  She had 5 days of IV solumedrol ad felt a little better. Upon discharge form the hospital her gait was still poor due to reduced balance more than strength.   She continues to feel reduced strength in her legs (initially worse on her left and now worse on rght).    Since then, she has had one epsidos of vertigo lasting a week or two and several episodes of gait doing worse.   She received IV steroids a couple times for possible exacerbation.  She was never on a DMT.  MRI brain in 2017 unchanged compared to 2014 and 2015.  MRI  cervical pine showed normal spinal cord   Currently, she has reduced gait due to resuced balance more than strength.   She continues to feel reduced strength in her legs (initially worse on her left and now worse on rght).   She reports numbness in legs, also initially worse on the left and now worse on her right.   She has pain in the legs helped by CBD cream.  She sometimes drops items with her right arm .  The left arm is fine.    She reports urinary urgency and has constipation.   No incontinence (rare with a cough/sneeze).    Vision is fine.     She has fatigue, cognitive and physical.   SHe notes reduced ability to come up with words and with her memory.   She has some depression and is not on any medication.   She sleeps a few hours, then wakes up for a while and then falls back asleep.   She snores but no other OSA sign noted by husband.     She reports Dr. [name] diagnosed  MS in 2014.  On 06/25/2014, she saw Dr. George Hugh at [facility] who felt that the MS changes were nonspecific and that she did not multiple sclerosis.   She never was on a DMT.     She does  not smoke.   No DM or HTN   MRI of the brain 03/17/2016 showed T2/FLAIR hyperintense foci predominantly in the subcortical and deep white matter, the largest focus in the posterior left frontal lobe.  There were no definite periventricular foci noted.  No infratentorial foci.  Normal enhancement pattern.  The MRI was unchanged compared to 04/28/2014.   MRI of the cervical spine 03/17/2016 showed a normal spinal cord and no significant degenerative changes.      REVIEW OF SYSTEMS: Constitutional: No fevers, chills, sweats, or change in appetite Eyes: No visual changes, double vision, eye pain Ear, nose and throat: No hearing loss, ear pain, nasal congestion, sore throat Cardiovascular: No chest pain, palpitations Respiratory:  No shortness of breath at rest or with exertion.   No wheezes GastrointestinaI: No nausea, vomiting,  diarrhea, abdominal pain, fecal incontinence Genitourinary:  No dysuria, urinary retention or frequency.  No nocturia. Musculoskeletal:  No neck pain, back pain Integumentary: No rash, pruritus, skin lesions Neurological: as above Psychiatric: No depression at this time.  No anxiety Endocrine: No palpitations, diaphoresis, change in appetite, change in weigh or increased thirst Hematologic/Lymphatic:  No anemia, purpura, petechiae. Allergic/Immunologic: No itchy/runny eyes, nasal congestion, recent allergic reactions, rashes  ALLERGIES: Allergies  Allergen Reactions   Morphine And Related Swelling   Penicillins Nausea And Vomiting    Has patient had a PCN reaction causing immediate rash, facial/tongue/throat swelling, SOB or lightheadedness with hypotension: No Has patient had a PCN reaction causing severe rash involving mucus membranes or skin necrosis: No Has patient had a PCN reaction that required hospitalization No Has patient had a PCN reaction occurring within the last 10 years: No If all of the above answers are "NO", then may proceed with Cephalosporin use.    Prednisone Nausea And Vomiting    HOME MEDICATIONS:  Current Outpatient Medications:    Aspirin-Salicylamide-Caffeine (BC HEADACHE PO), Take by mouth as needed (for headache)., Disp: , Rfl:    LORazepam (ATIVAN) 1 MG tablet, Take 2 to 3 pills as needed prior to MRi, Disp: 3 tablet, Rfl: 0   NON FORMULARY, Apply 1 application topically daily as needed (affected area/pain). CBD oil, Disp: , Rfl:   PAST MEDICAL HISTORY: Past Medical History:  Diagnosis Date   Anxiety    Asthma    Chronic headaches    IBS (irritable bowel syndrome)    MS (multiple sclerosis) ([place]) 2014   Palpitations    Pneumonia    Rotator cuff arthropathy, left     PAST SURGICAL HISTORY: Past Surgical History:  Procedure Laterality Date   ABDOMINAL HYSTERECTOMY     APPENDECTOMY     CESAREAN SECTION     CHOLECYSTECTOMY     plate and pin  in right arm     ROTATOR CUFF REPAIR      FAMILY HISTORY: Family History  Problem Relation Age of Onset   Dementia Mother    Cancer Mother    Diabetes Mother    Other Sister        blood clot in leg   Cancer Maternal Grandmother    Cancer Maternal Grandfather    Congestive Heart Failure Maternal Grandfather    Diabetes type I Daughter    Other Daughter        stomach issue   Seizures Son     SOCIAL HISTORY:  Social History   Socioeconomic History   Marital status: Married    Spouse name: Meda Coffee   Number of children: 2  Years of education: Not on file   Highest education level: 11th grade  Occupational History   Not on file  Tobacco Use   Smoking status: Never   Smokeless tobacco: Never  Substance and Sexual Activity   Alcohol use: No   Drug use: No    Comment: CBD   Sexual activity: Yes    Birth control/protection: Surgical    Comment: hyst  Other Topics Concern   Not on file  Social History Narrative   Lives at home with husband and son (64)   R handed   Caffeine: 5 Cups of iced coffee a week    Social Determinants of Radio broadcast assistant Strain: Not on file  Food Insecurity: Not on file  Transportation Needs: Not on file  Physical Activity: Not on file  Stress: Not on file  Social Connections: Not on file  Intimate Partner Violence: Not on file     PHYSICAL EXAM  Vitals:   06/08/22 0850  BP: 140/89  Pulse: 74  Weight: 226 lb (102.5 kg)  Height: 5' (1.524 m)    Body mass index is 44.14 kg/m.   General: The patient is well-developed and well-nourished and in no acute distress   HEENT:  Head is [place]/AT.  Sclera are anicteric.  Funduscopic exam shows normal optic discs and retinal vessels.   Neck: No carotid bruits are noted.  The neck is nontender.   Cardiovascular: The heart has a regular rate and rhythm with a normal S1 and S2. There were no murmurs, gallops or rubs.     Skin: Extremities are without rash or  edema.    Musculoskeletal:  Back is nontender   Neurologic Exam   Mental status: The patient is alert and oriented x 3 at the time of the examination. The patient has apparent normal recent and remote memory, with an apparently normal attention span and concentration ability.   Speech is normal.   Cranial nerves: Extraocular movements are full. Pupils are equal, round, and reactive to light and accomodation.  Visual fields are full.  Facial symmetry is present. There is good facial sensation to soft touch bilaterally.Facial strength is normal.  Trapezius and sternocleidomastoid strength is normal. No dysarthria is noted.  The tongue is midline, and the patient has symmetric elevation of the soft palate. No obvious hearing deficits are noted.   Motor:  Muscle bulk is normal.   Tone is normal. Strength is  5 / 5 in arms and 4+/5 in proximal legs and ankle/toe extension   Sensory: Sensory testing is intact to pinprick, soft touch and vibration sensation in the arms.    Vibration and touch are normal at knees but reduced in feet.    She has Tinel's signs over  median (wrist) and ulnar (elbows) nerves.    Coordination: Cerebellar testing reveals good finger-nose-finger and heel-to-shin bilaterally.   Gait and station: Station is mildly wide.   Gait is wide with mild foot drops. Tandem is poor.  Romberg is borderline.    Reflexes: Deep tendon reflexes are symmetric and normal bilaterally.   Plantar responses are flexor.  Reflexes: Deep tendon reflexes are symmetric and normal bilaterally.   Plantar responses are flexor.    DIAGNOSTIC DATA (LABS, IMAGING, TESTING) - I reviewed patient records, labs, notes, testing and imaging myself where available.  Lab Results  Component Value Date   WBC 8.9 02/14/2022   HGB 14.4 02/14/2022   HCT 44.4 02/14/2022   MCV 89.2 02/14/2022  PLT 348 02/14/2022      Component Value Date/Time   NA 141 02/14/2022 1019   K 3.6 02/14/2022 1019   CL 107 02/14/2022 1019    CO2 27 02/14/2022 1019   GLUCOSE 118 (H) 02/14/2022 1019   BUN 14 02/14/2022 1019   CREATININE 0.72 02/14/2022 1019   CALCIUM 9.2 02/14/2022 1019   PROT 6.7 08/18/2021 0429   PROT 6.8 08/18/2021 0429   ALBUMIN 3.4 (L) 08/18/2021 0429   ALBUMIN 3.4 (L) 08/18/2021 0429   AST 15 08/18/2021 0429   AST 14 (L) 08/18/2021 0429   ALT 14 08/18/2021 0429   ALT 15 08/18/2021 0429   ALKPHOS 77 08/18/2021 0429   ALKPHOS 76 08/18/2021 0429   BILITOT 0.6 08/18/2021 0429   BILITOT 0.6 08/18/2021 0429   GFRNONAA >60 02/14/2022 1019   GFRAA >60 06/09/2019 1513   No results found for: "CHOL", "HDL", "LDLCALC", "LDLDIRECT", "TRIG", "CHOLHDL" Lab Results  Component Value Date   HGBA1C 5.7 (H) 04/29/2014   Lab Results  Component Value Date   VITAMINB12 412 03/16/2016   Lab Results  Component Value Date   TSH 5.175 (H) 03/16/2016       ASSESSMENT AND PLAN  Gait disturbance - Plan: Multiple Myeloma Panel (SPEP&IFE w/QIG), Vitamin B12, MR THORACIC SPINE WO CONTRAST, MR CERVICAL SPINE WO CONTRAST  Ataxia - Plan: MR BRAIN WO CONTRAST  Numbness - Plan: Multiple Myeloma Panel (SPEP&IFE w/QIG), Vitamin B12, MR THORACIC SPINE WO CONTRAST, MR CERVICAL SPINE WO CONTRAST  White matter abnormality on MRI of brain  In summary, Ms. Reining is a 61 year old woman with gait disturbance and mild leg weakness and numbness since 2014.  The MRI of the brain had shown some white matter foci though they are nonspecific and not in a pattern typical for MS.  MRI of the cervical spine showed a normal spinal cord.  Therefore, the MRIs do not explain her symptoms.  An MRI of the thoracic spine was not performed.  Symptoms would actually map out more to that region as her arms were fine at the time.  I think the likelihood she has MS is low given the MRI of the brain appearance though there is still some possibility.  She could also have had a transverse myelitis in the thoracic spine that was not covered by the MRI  images of the cervical spine and that could better explain symptoms in the legs and not in the arms.  We will  check an MRI of the brain, cervical and thoracic spine to determine if there is a transverse myelitis or extrinsic myelopathy that could explain her leg symptoms.  Additionally the brain will allow [place] to determine if this change over time consistent with MS. the numbness in the legs is more in the feet than elsewhere and I will check SPEP and B12 to rule out treatable causes of neuropathy.  She could have carpal tunnel and/or ulnar neuropathy in the arms..  If symptoms worsen we will consider checking NCV/EMG.   She will return to see [place] in 4 to 6 months or sooner if there are new or worsening neurologic symptoms.   Thank you for asking me to see Ms. Monterosso.  Please let me know if I can be of further assistance with her or other patients in the future    Clayvon Parlett A. Felecia Shelling, MD, [facility] 12/04/9756, 8:32 PM Certified in Neurology, Clinical Neurophysiology, Sleep Medicine and Neuroimaging  [facility] [address], [address]  [place], [place] [address] [phone]

## 2022-06-08 ENCOUNTER — Encounter: Payer: Self-pay | Admitting: Neurology

## 2022-06-08 ENCOUNTER — Ambulatory Visit (INDEPENDENT_AMBULATORY_CARE_PROVIDER_SITE_OTHER): Payer: Self-pay | Admitting: Neurology

## 2022-06-08 VITALS — BP 140/89 | HR 74 | Ht 60.0 in | Wt 226.0 lb

## 2022-06-08 DIAGNOSIS — R270 Ataxia, unspecified: Secondary | ICD-10-CM

## 2022-06-08 DIAGNOSIS — R269 Unspecified abnormalities of gait and mobility: Secondary | ICD-10-CM

## 2022-06-08 DIAGNOSIS — R9082 White matter disease, unspecified: Secondary | ICD-10-CM

## 2022-06-08 DIAGNOSIS — R200 Anesthesia of skin: Secondary | ICD-10-CM

## 2022-06-08 MED ORDER — LORAZEPAM 1 MG PO TABS: ORAL_TABLET | ORAL | 0 refills | Status: DC

## 2022-06-08 NOTE — Progress Notes (Signed)
[facility]  PATIENT: Kristine Miller DOB: Apr 12, 1961  REFERRING DOCTOR OR PCP:  [name], MD SOURCE: patient, husband, notes from PCP and [place] (2015 neurology), imaging reports, MRI images personally reviewed.     _________________________________   HISTORICAL  CHIEF COMPLAINT:  Chief Complaint  Patient presents with   New Patient (Initial Visit)    Rm 2, w husband. Pt here to establish care for her MS. Last MRI done in 2017 (in [place]) and is highly claustrophobic. Pt's MS has not been managed in over 5 years. Pt having trouble walking, pn in both legs and using CBD oil cream, which has helped a lot. Having trouble speaking and dropping things.     HISTORY OF PRESENT ILLNESS:  I have the pleasure of seeing your patient, Kristine Miller, at [facility] for a diagnosis of multiple sclerosis.  She is a 61 year old woman with the onset of leg weakness, numbness in October 2014 with residual gait issues who was diagnosed with MS at the time  In 2014, she went to the ED due to reduced sensation in her legs.  She felt gait was off balanced.   She also had chest pain at the time,  She had an MRI showing some non-specific white matter foci and was told she had MS.   She also had an LP and she reports being told CSF was abnormal but I reviewed the results and they actually show no oligoclonal bands and a normal IgG index.  She had 5 days of IV solumedrol ad felt a little better. Upon discharge form the hospital her gait was still poor due to reduced balance more than strength.   She continues to feel reduced strength in her legs (initially worse on her left and now worse on rght).    Since then, she has had one epsidos of vertigo lasting a week or two and several episodes of gait doing worse.   She received IV steroids a couple times for possible exacerbation.  She was never on a DMT.  MRI brain in 2017 unchanged compared to 2014 and 2015.  MRI cervical pine  showed normal spinal cord  Currently, she has reduced gait due to resuced balance more than strength.   She continues to feel reduced strength in her legs (initially worse on her left and now worse on rght).   She reports numbness in legs, also initially worse on the left and now worse on her right.   She has pain in the legs helped by CBD cream.  She sometimes drops items with her right arm .  The left arm is fine.    She reports urinary urgency and has constipation.   No incontinence (rare with a cough/sneeze).    Vision is fine.    She has fatigue, cognitive and physical.   SHe notes reduced ability to come up with words and with her memory.   She has some depression and is not on any medication.   She sleeps a few hours, then wakes up for a while and then falls back asleep.   She snores but no other OSA sign noted by husband.    She reports Dr. [name] diagnosed  MS in 2014.  On 06/25/2014, she saw Dr. George Hugh at [facility] who felt that the MS changes were nonspecific and that she did not multiple sclerosis.   She never was on a DMT.    She does not smoke.   No  DM or HTN  MRI of the brain 03/17/2016 showed T2/FLAIR hyperintense foci predominantly in the subcortical and deep white matter, the largest focus in the posterior left frontal lobe.  There were no definite periventricular foci noted.  No infratentorial foci.  Normal enhancement pattern.  The MRI was unchanged compared to 04/28/2014.  MRI of the cervical spine 03/17/2016 showed a normal spinal cord and no significant degenerative changes.   REVIEW OF SYSTEMS: Constitutional: No fevers, chills, sweats, or change in appetite  Has fatigue Eyes: No visual changes, double vision, eye pain Ear, nose and throat: No hearing loss, ear pain, nasal congestion, sore throat Cardiovascular: No chest pain, palpitations Respiratory:  No shortness of breath at rest or with exertion.   No wheezes GastrointestinaI: No nausea, vomiting, diarrhea,  abdominal pain, fecal incontinence Genitourinary:  has urgency/ frequency.  No nocturia. Musculoskeletal:  No neck pain, back pain Integumentary: No rash, pruritus, skin lesions Neurological: as above Psychiatric: No depression at this time.  No anxiety Endocrine: No palpitations, diaphoresis, change in appetite, change in weigh or increased thirst Hematologic/Lymphatic:  No anemia, purpura, petechiae. Allergic/Immunologic: No itchy/runny eyes, nasal congestion, recent allergic reactions, rashes  ALLERGIES: Allergies  Allergen Reactions   Morphine And Related Swelling   Penicillins Nausea And Vomiting    Has patient had a PCN reaction causing immediate rash, facial/tongue/throat swelling, SOB or lightheadedness with hypotension: No Has patient had a PCN reaction causing severe rash involving mucus membranes or skin necrosis: No Has patient had a PCN reaction that required hospitalization No Has patient had a PCN reaction occurring within the last 10 years: No If all of the above answers are "NO", then may proceed with Cephalosporin use.    Prednisone Nausea And Vomiting    HOME MEDICATIONS:  Current Outpatient Medications:    Aspirin-Salicylamide-Caffeine (BC HEADACHE PO), Take by mouth as needed (for headache)., Disp: , Rfl:    LORazepam (ATIVAN) 1 MG tablet, Take 2 to 3 pills as needed prior to MRi, Disp: 3 tablet, Rfl: 0   NON FORMULARY, Apply 1 application topically daily as needed (affected area/pain). CBD oil, Disp: , Rfl:   PAST MEDICAL HISTORY: Past Medical History:  Diagnosis Date   Anxiety    Asthma    Chronic headaches    IBS (irritable bowel syndrome)    MS (multiple sclerosis) ([place]) 2014   Palpitations    Pneumonia    Rotator cuff arthropathy, left     PAST SURGICAL HISTORY: Past Surgical History:  Procedure Laterality Date   ABDOMINAL HYSTERECTOMY     APPENDECTOMY     CESAREAN SECTION     CHOLECYSTECTOMY     plate and pin in right arm     ROTATOR CUFF  REPAIR      FAMILY HISTORY: Family History  Problem Relation Age of Onset   Dementia Mother    Cancer Mother    Diabetes Mother    Other Sister        blood clot in leg   Cancer Maternal Grandmother    Cancer Maternal Grandfather    Congestive Heart Failure Maternal Grandfather    Diabetes type I Daughter    Other Daughter        stomach issue   Seizures Son     SOCIAL HISTORY:  Social History   Socioeconomic History   Marital status: Married    Spouse name: Meda Coffee   Number of children: 2   Years of education: Not on file   Highest  education level: 11th grade  Occupational History   Not on file  Tobacco Use   Smoking status: Never   Smokeless tobacco: Never  Substance and Sexual Activity   Alcohol use: No   Drug use: No    Comment: CBD   Sexual activity: Yes    Birth control/protection: Surgical    Comment: hyst  Other Topics Concern   Not on file  Social History Narrative   Lives at home with husband and son (23)   R handed   Caffeine: 5 Cups of iced coffee a week    Social Determinants of Radio broadcast assistant Strain: Not on file  Food Insecurity: Not on file  Transportation Needs: Not on file  Physical Activity: Not on file  Stress: Not on file  Social Connections: Not on file  Intimate Partner Violence: Not on file     PHYSICAL EXAM  Vitals:   06/08/22 0850  BP: 140/89  Pulse: 74  Weight: 226 lb (102.5 kg)  Height: 5' (1.524 m)    Body mass index is 44.14 kg/m.   General: The patient is well-developed and well-nourished and in no acute distress  HEENT:  Head is [place]/AT.  Sclera are anicteric.  Funduscopic exam shows normal optic discs and retinal vessels.  Neck: No carotid bruits are noted.  The neck is nontender.  Cardiovascular: The heart has a regular rate and rhythm with a normal S1 and S2. There were no murmurs, gallops or rubs.    Skin: Extremities are without rash or  edema.  Musculoskeletal:  Back is  nontender  Neurologic Exam  Mental status: The patient is alert and oriented x 3 at the time of the examination. The patient has apparent normal recent and remote memory, with an apparently normal attention span and concentration ability.   Speech is normal.  Cranial nerves: Extraocular movements are full. Pupils are equal, round, and reactive to light and accomodation.  Visual fields are full.  Facial symmetry is present. There is good facial sensation to soft touch bilaterally.Facial strength is normal.  Trapezius and sternocleidomastoid strength is normal. No dysarthria is noted.  The tongue is midline, and the patient has symmetric elevation of the soft palate. No obvious hearing deficits are noted.  Motor:  Muscle bulk is normal.   Tone is normal. Strength is  5 / 5 in arms and 4+/5 in proximal legs and ankle/toe extension  Sensory: Sensory testing is intact to pinprick, soft touch and vibration sensation in the arms.    Vibration and touch are normal at knees but reduced in feet.    She has Tinel's signs over  median (wrist) and ulnar (elbows) nerves.   Coordination: Cerebellar testing reveals good finger-nose-finger and heel-to-shin bilaterally.  Gait and station: Station is mildly wide.   Gait is wide with mild foot drops. Tandem is poor.  Romberg is borderline.   Reflexes: Deep tendon reflexes are symmetric and normal bilaterally.   Plantar responses are flexor.    DIAGNOSTIC DATA (LABS, IMAGING, TESTING) - I reviewed patient records, labs, notes, testing and imaging myself where available.  Lab Results  Component Value Date   WBC 8.9 02/14/2022   HGB 14.4 02/14/2022   HCT 44.4 02/14/2022   MCV 89.2 02/14/2022   PLT 348 02/14/2022      Component Value Date/Time   NA 141 02/14/2022 1019   K 3.6 02/14/2022 1019   CL 107 02/14/2022 1019   CO2 27 02/14/2022 1019  GLUCOSE 118 (H) 02/14/2022 1019   BUN 14 02/14/2022 1019   CREATININE 0.72 02/14/2022 1019   CALCIUM 9.2  02/14/2022 1019   PROT 6.7 08/18/2021 0429   PROT 6.8 08/18/2021 0429   ALBUMIN 3.4 (L) 08/18/2021 0429   ALBUMIN 3.4 (L) 08/18/2021 0429   AST 15 08/18/2021 0429   AST 14 (L) 08/18/2021 0429   ALT 14 08/18/2021 0429   ALT 15 08/18/2021 0429   ALKPHOS 77 08/18/2021 0429   ALKPHOS 76 08/18/2021 0429   BILITOT 0.6 08/18/2021 0429   BILITOT 0.6 08/18/2021 0429   GFRNONAA >60 02/14/2022 1019   GFRAA >60 06/09/2019 1513   No results found for: "CHOL", "HDL", "LDLCALC", "LDLDIRECT", "TRIG", "CHOLHDL" Lab Results  Component Value Date   HGBA1C 5.7 (H) 04/29/2014   Lab Results  Component Value Date   VITAMINB12 412 03/16/2016   Lab Results  Component Value Date   TSH 5.175 (H) 03/16/2016       ASSESSMENT AND PLAN  Gait disturbance - Plan: Multiple Myeloma Panel (SPEP&IFE w/QIG), Vitamin B12, MR THORACIC SPINE WO CONTRAST, MR CERVICAL SPINE WO CONTRAST  Ataxia - Plan: MR BRAIN WO CONTRAST  Numbness - Plan: Multiple Myeloma Panel (SPEP&IFE w/QIG), Vitamin B12, MR THORACIC SPINE WO CONTRAST, MR CERVICAL SPINE WO CONTRAST  White matter abnormality on MRI of brain   In summary, Ms. Pates is a 61 year old woman with gait disturbance and mild leg weakness and numbness since 2014.  The MRI of the brain had shown some white matter foci though they are nonspecific and not in a pattern typical for MS.  MRI of the cervical spine showed a normal spinal cord.  Therefore, the MRIs do not explain her symptoms.  An MRI of the thoracic spine was not performed.  Symptoms would actually map out more to that region as her arms were fine at the time.  I think the likelihood she has MS is low given the MRI of the brain appearance though there is still some possibility.  She could also have had a transverse myelitis in the thoracic spine that was not covered by the MRI images of the cervical spine and that could better explain symptoms in the legs and not in the arms.  We will  check an MRI of the  brain, cervical and thoracic spine to determine if there is a transverse myelitis or extrinsic myelopathy that could explain her leg symptoms.  Additionally the brain will allow [place] to determine if this change over time consistent with MS. the numbness in the legs is more in the feet than elsewhere and I will check SPEP and B12 to rule out treatable causes of neuropathy.  She could have carpal tunnel and/or ulnar neuropathy in the arms..  If symptoms worsen we will consider checking NCV/EMG.  She will return to see [place] in 4 to 6 months or sooner if there are new or worsening neurologic symptoms.  Thank you for asking me to see Ms. Goethe.  Please let me know if I can be of further assistance with her or other patients in the future   Jess Toney A. Felecia Shelling, MD, [facility] 03/22/3266, 1:24 PM Certified in Neurology, Clinical Neurophysiology, Sleep Medicine and Neuroimaging  [facility] [address] [place], [place] [address] [phone]

## 2022-06-09 ENCOUNTER — Telehealth: Payer: Self-pay | Admitting: Neurology

## 2022-06-09 NOTE — Telephone Encounter (Signed)
self-pay sent to GI

## 2022-06-12 IMAGING — CT CT ANGIO CHEST
2 of 7 series · 18 of 46 positions shown · IV contrast (Omnipaque or Isovue)
Comparison: Chest x-ray from same day. CT chest dated August 22, 2021.

CLINICAL DATA: Chest pain and left arm numbness since [REDACTED].
History of blood clots.

EXAM:
CT ANGIOGRAPHY CHEST WITH CONTRAST
TECHNIQUE: Multidetector CT imaging of the chest was performed using the
standard protocol during bolus administration of intravenous
contrast. Multiplanar CT image reconstructions and MIPs were
obtained to evaluate the vascular anatomy.

[Series 6: pe axial thins · axial · 0.73mm/px · z∈[+1226,+1482]mm · 15 of 362 slices shown]
[im 21/362  lung]
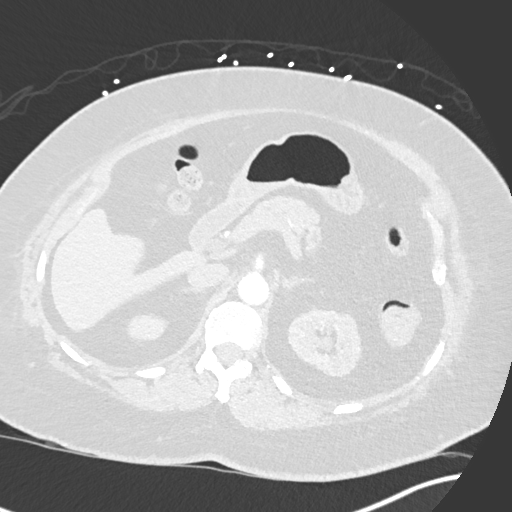
[im 41/362  soft-tissue]
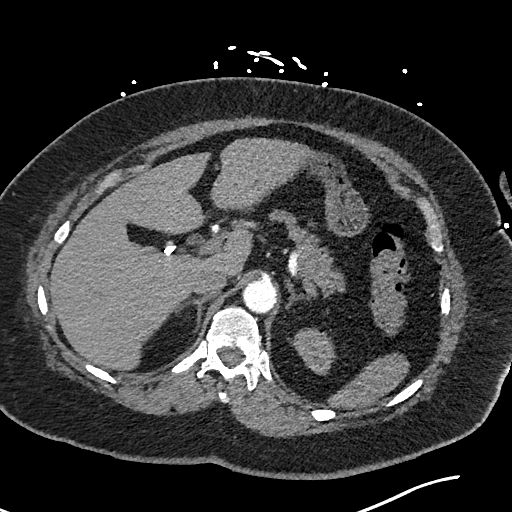
[im 61/362  lung]
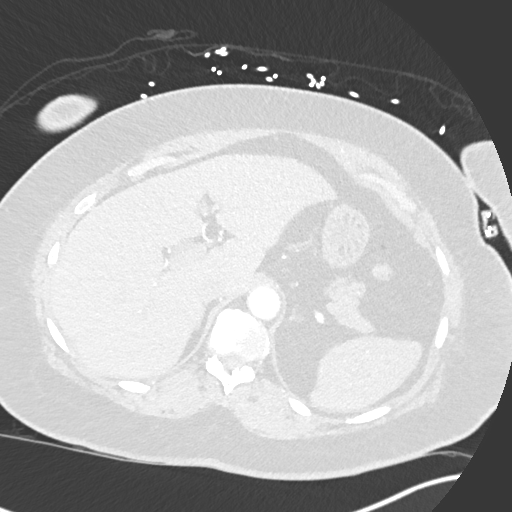
[im 81/362  soft-tissue]
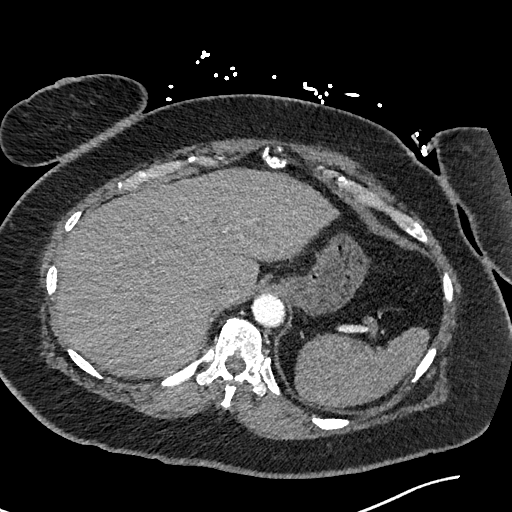
[im 121/362  lung]
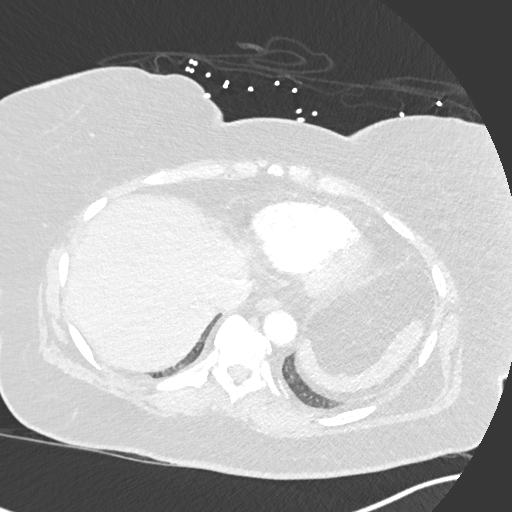
[im 141/362  soft-tissue]
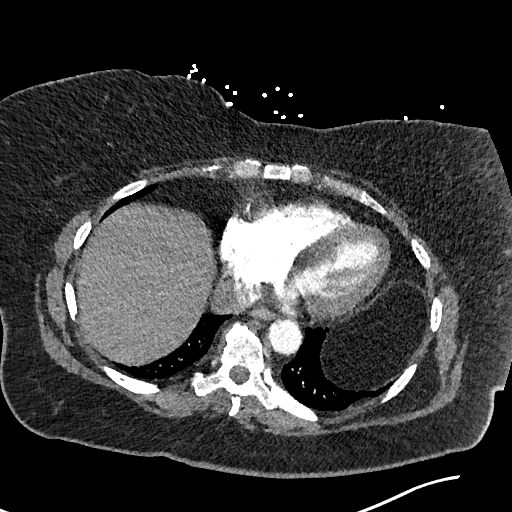
[im 161/362  lung]
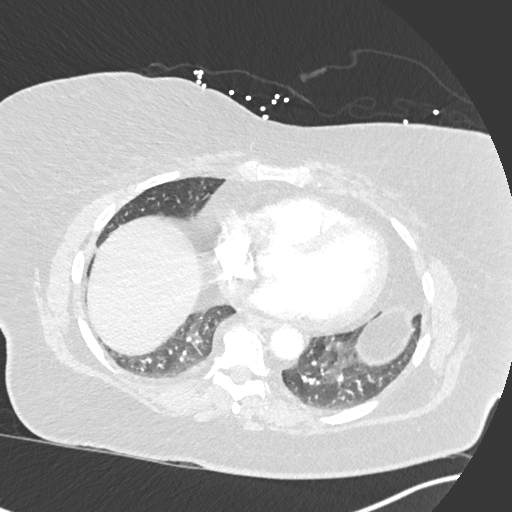
[im 181/362  soft-tissue]
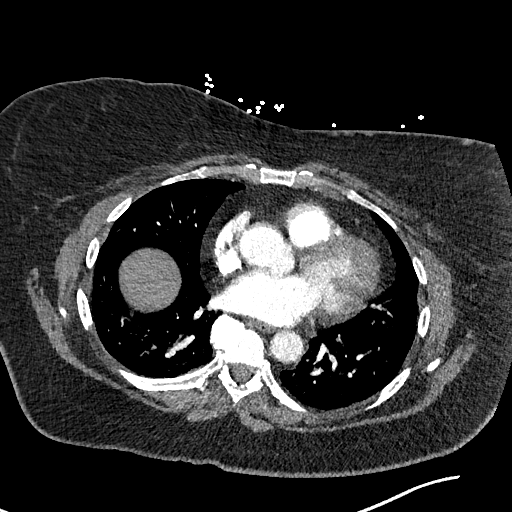
[im 201/362  lung]
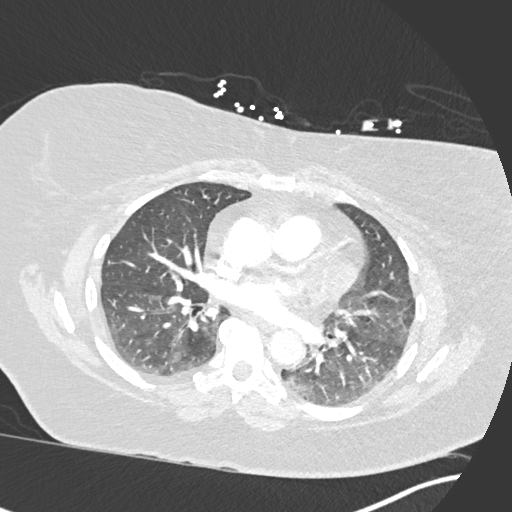
[im 221/362  soft-tissue]
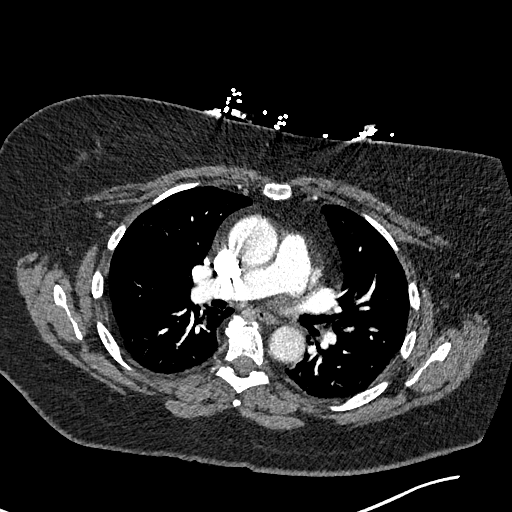
[im 241/362  lung]
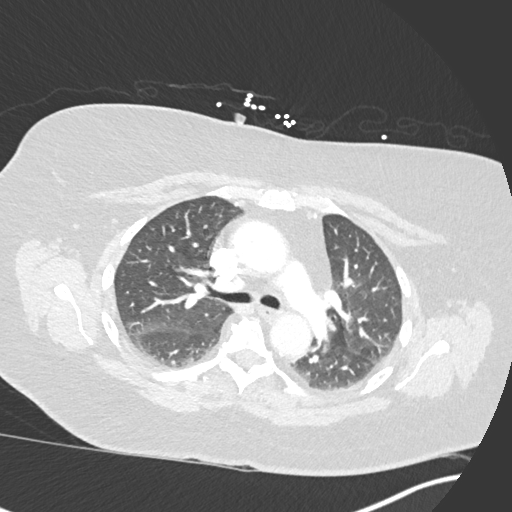
[im 281/362  soft-tissue]
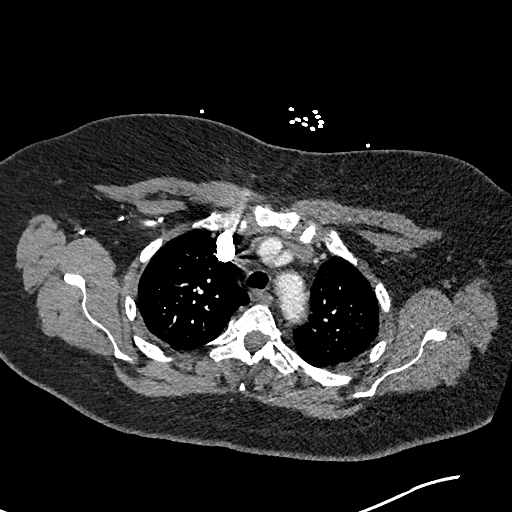
[im 301/362  lung]
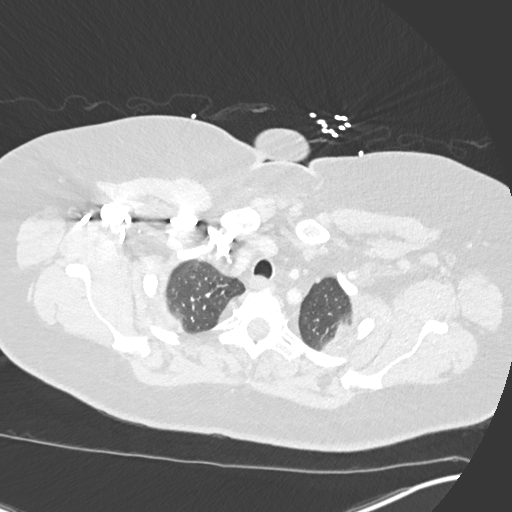
[im 321/362  soft-tissue]
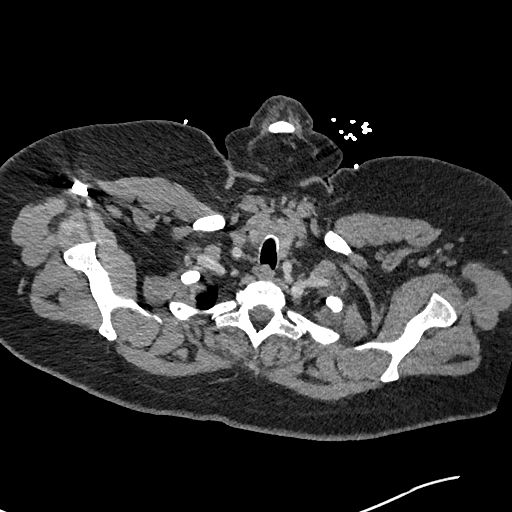
[im 341/362  lung]
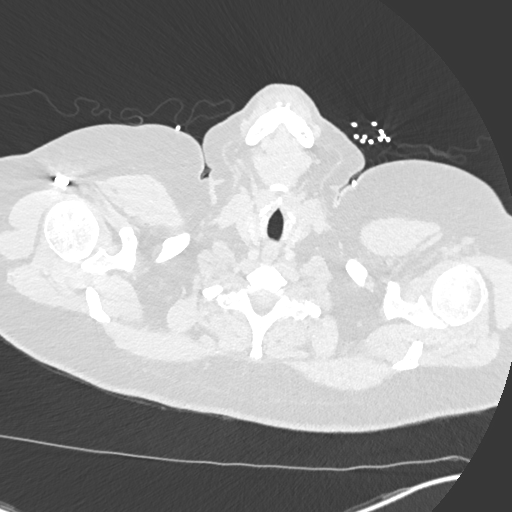

[Series 8: cor soft · coronal · 0.58mm/px · 3 of 159 slices shown]
[im 40/159  soft-tissue]
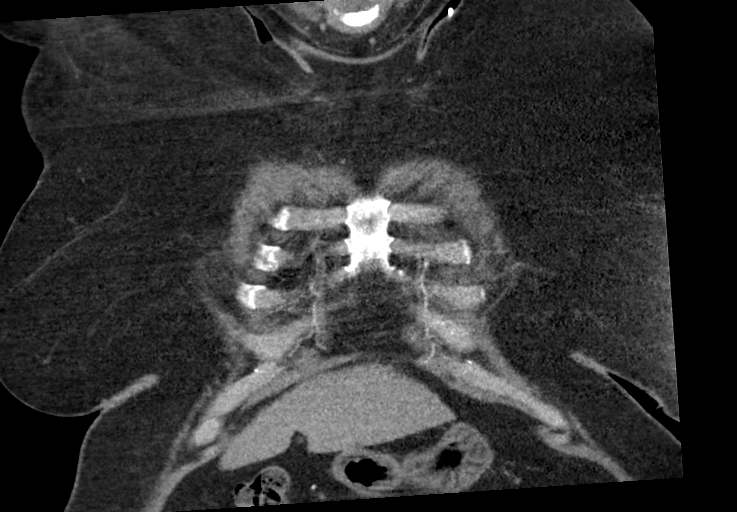
[im 80/159  soft-tissue]
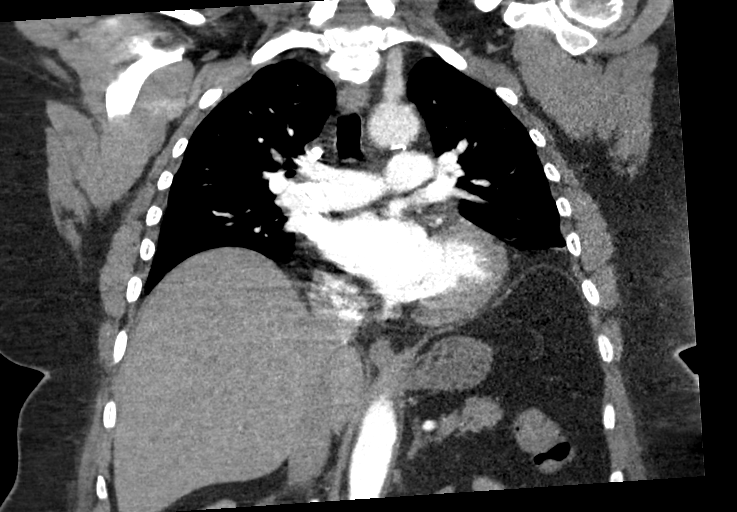
[im 119/159  soft-tissue]
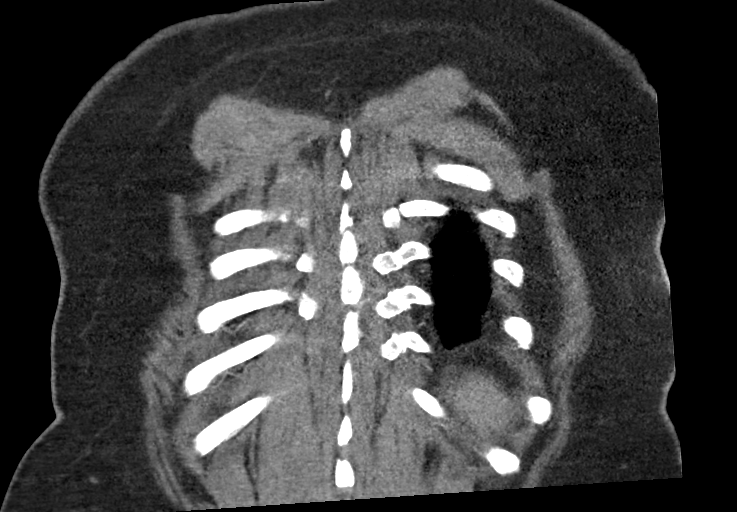

[18 of 46 positions shown; findings below may reference images not displayed]

RADIATION DOSE REDUCTION: This exam was performed according to the
departmental dose-optimization program which includes automated
exposure control, adjustment of the mA and/or kV according to
patient size and/or use of iterative reconstruction technique.

CONTRAST:  75mL OMNIPAQUE IOHEXOL 350 MG/ML SOLN
FINDINGS: Cardiovascular: Satisfactory opacification of the pulmonary arteries
to the segmental level. No evidence of pulmonary embolism. Normal
heart size. No pericardial effusion. No thoracic aortic aneurysm or
dissection.

Mediastinum/Nodes: No enlarged mediastinal, hilar, or axillary lymph
nodes. Thyroid gland, trachea, and esophagus demonstrate no
significant findings.

Lungs/Pleura: No focal consolidation, pleural effusion, or
pneumothorax.

Upper Abdomen: No acute abnormality.  Prior cholecystectomy.

Musculoskeletal: No chest wall abnormality. No acute or significant
osseous findings.

Review of the MIP images confirms the above findings.
IMPRESSION: 1. No evidence of pulmonary embolism. No acute intrathoracic
process.

## 2022-06-12 IMAGING — DX DG CHEST 2V
2 series · 2 of 2 positions shown · non-contrast
Comparison: Chest radiograph dated August 22, 2021

CLINICAL DATA: Chest pain

EXAM:
CHEST - 2 VIEW

[chest pa]
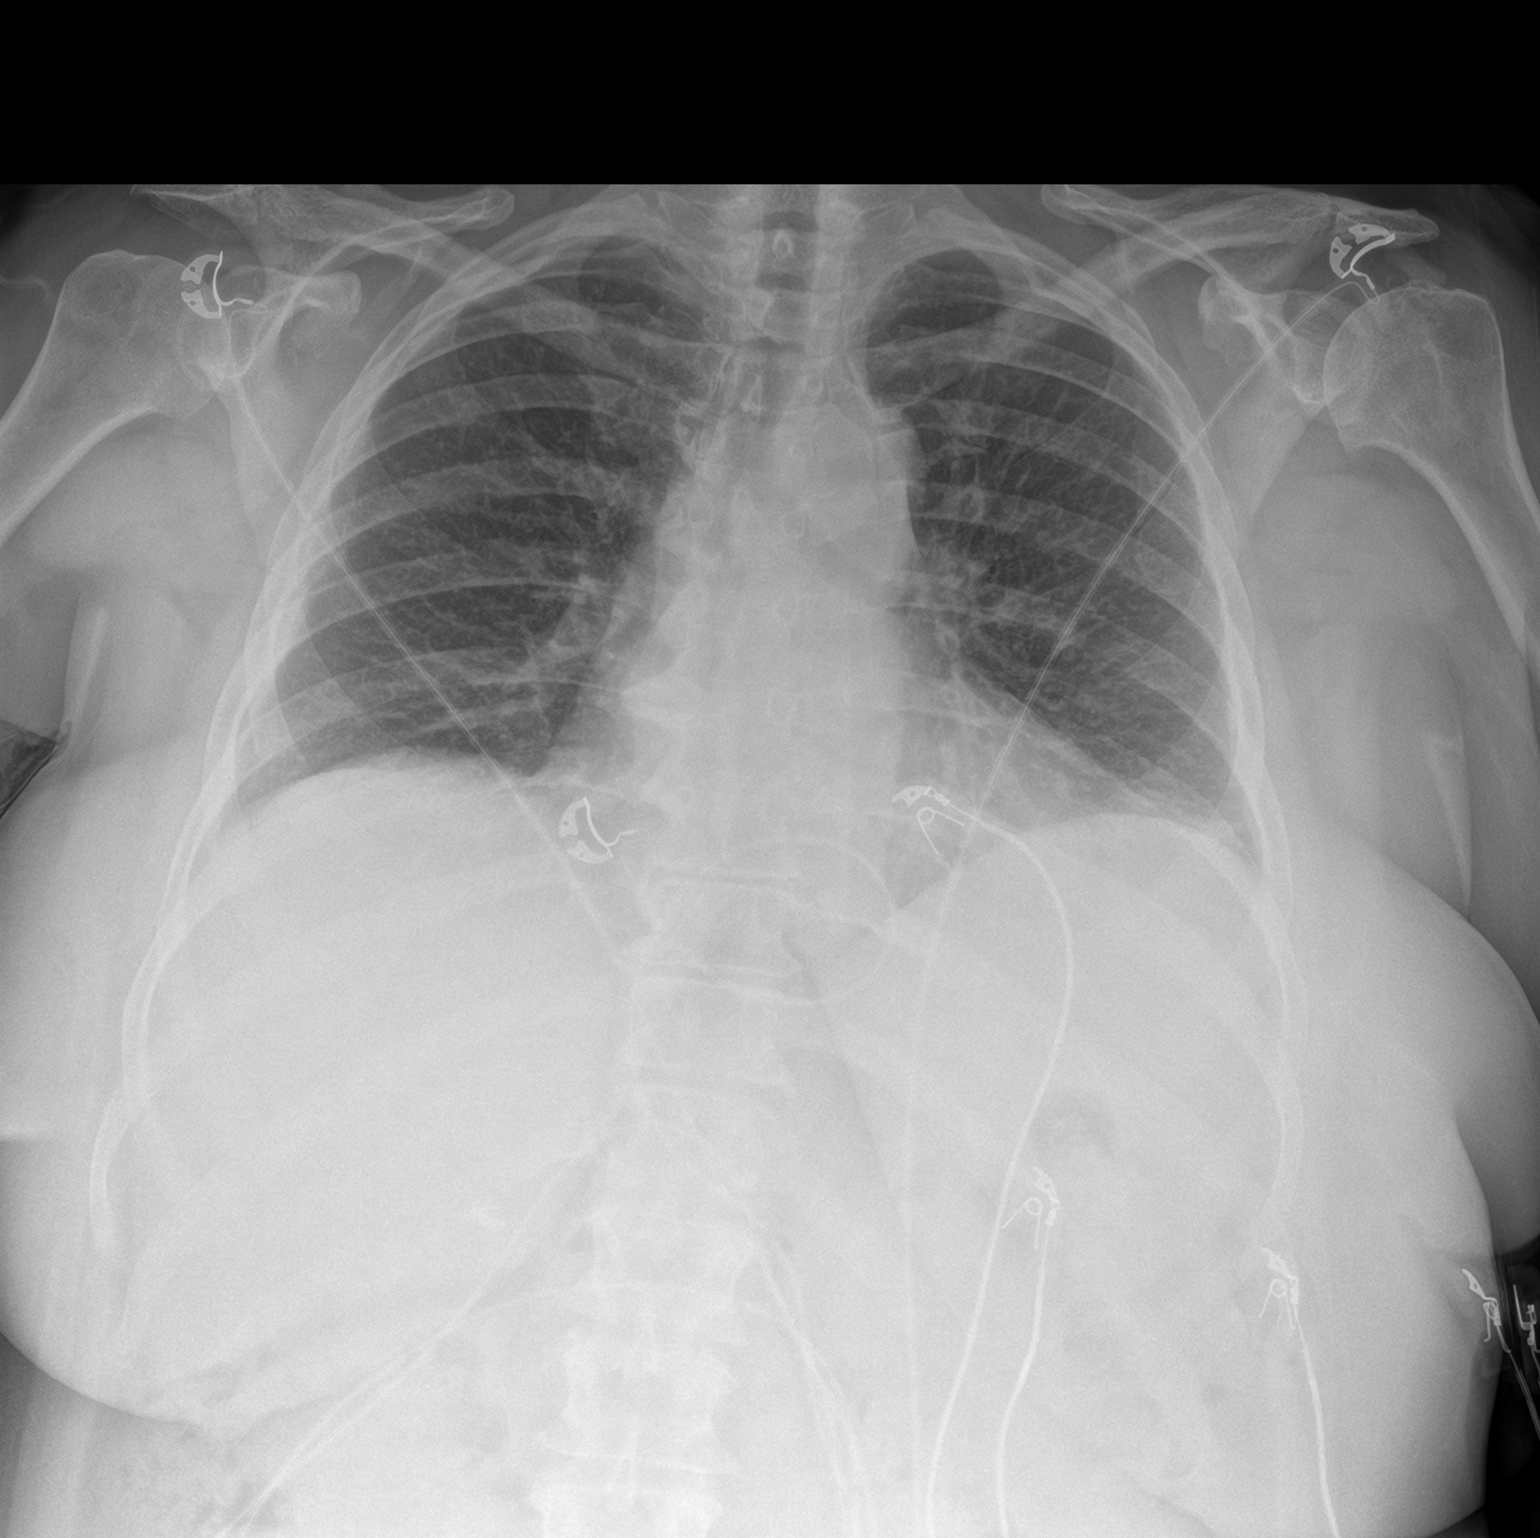

[chest lat]
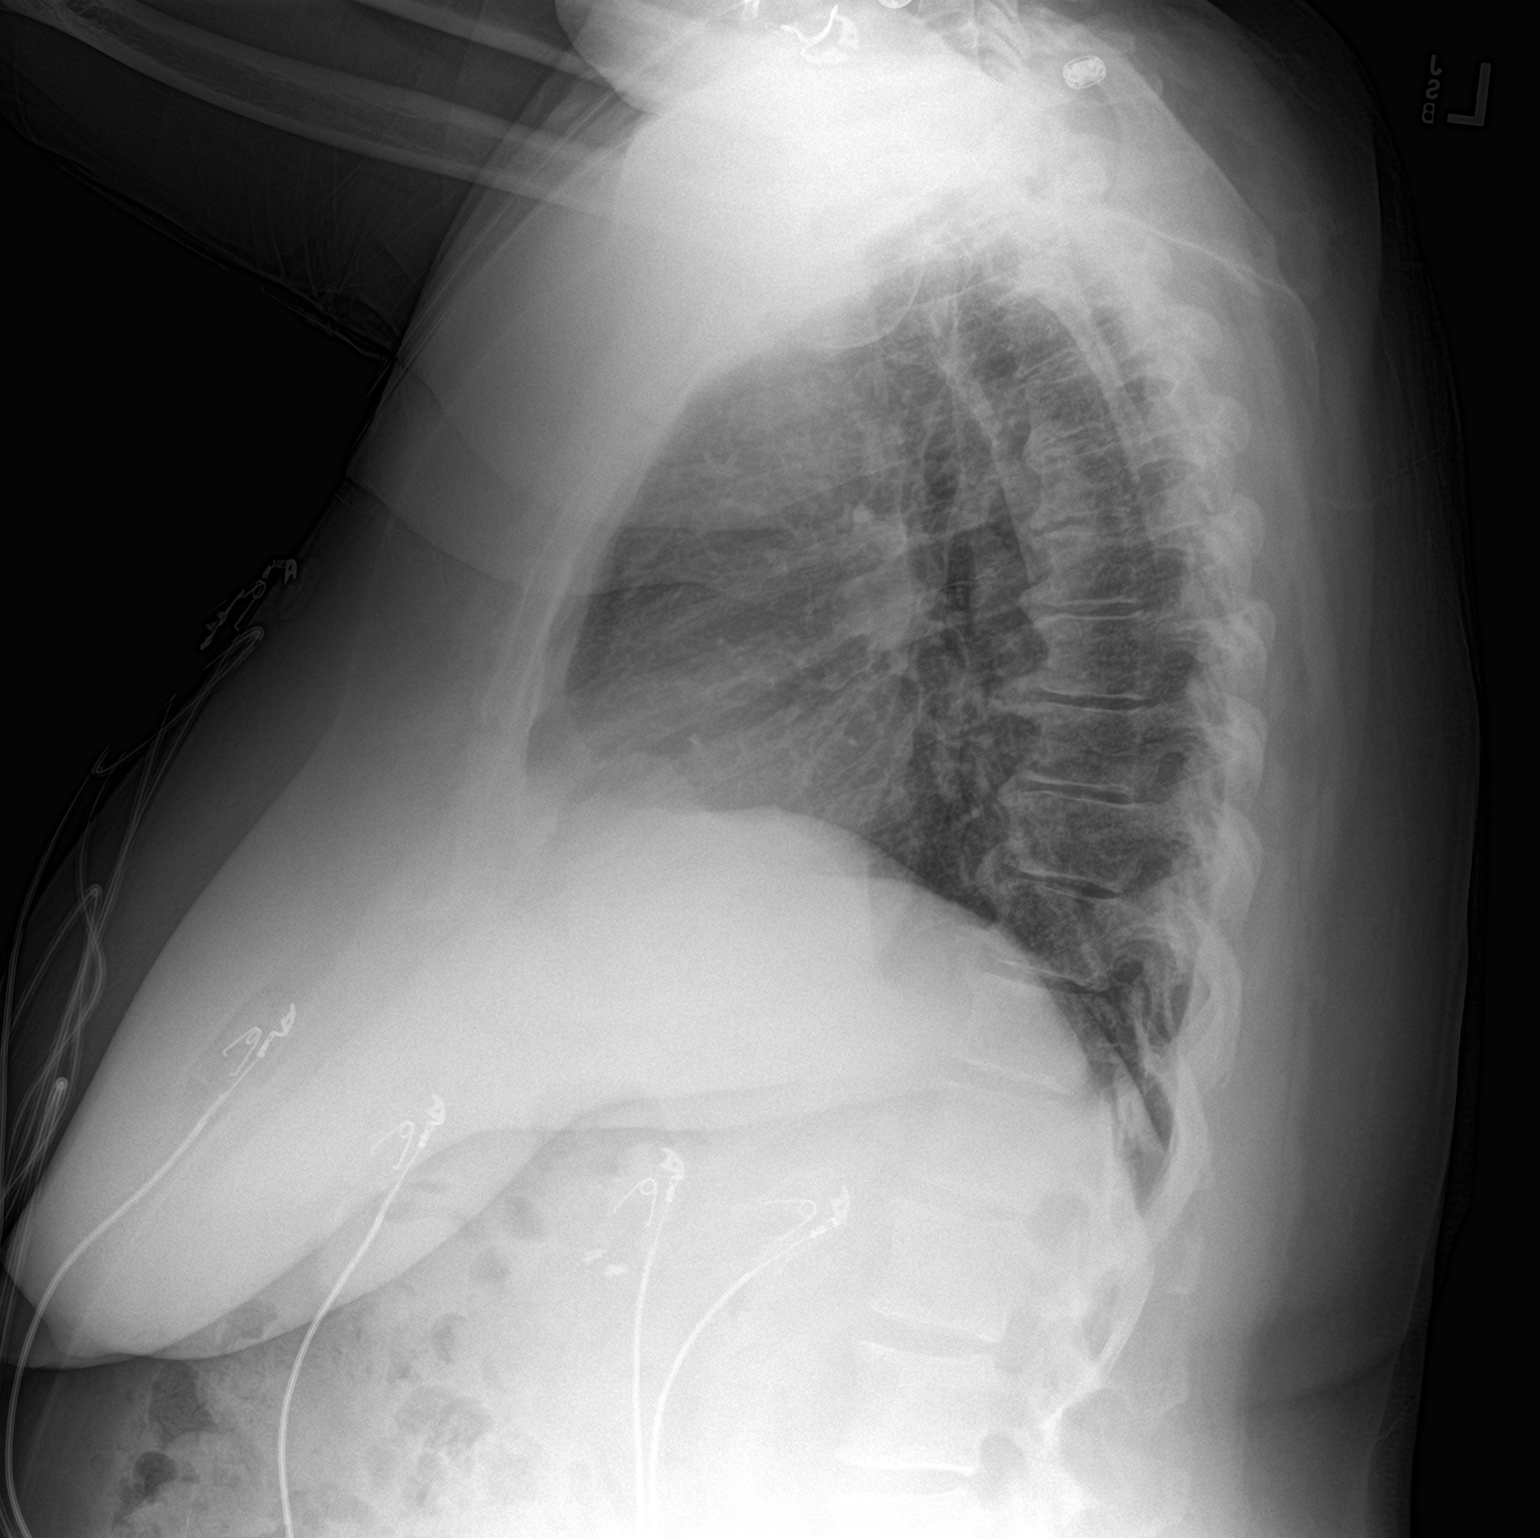

[2 of 2 positions shown; findings below may reference images not displayed]

FINDINGS: The heart size and mediastinal contours are within normal limits.
Low lung volumes. No focal consolidation or pleural effusion. The
visualized skeletal structures are unremarkable.
IMPRESSION: No active cardiopulmonary disease.

## 2022-06-14 LAB — MULTIPLE MYELOMA PANEL, SERUM
Albumin SerPl Elph-Mcnc: 3.4 g/dL (ref 2.9–4.4)
Albumin/Glob SerPl: 1.1 (ref 0.7–1.7)
Alpha 1: 0.2 g/dL (ref 0.0–0.4)
Alpha2 Glob SerPl Elph-Mcnc: 0.8 g/dL (ref 0.4–1.0)
B-Globulin SerPl Elph-Mcnc: 1.5 g/dL — ABNORMAL HIGH (ref 0.7–1.3)
Gamma Glob SerPl Elph-Mcnc: 0.9 g/dL (ref 0.4–1.8)
Globulin, Total: 3.4 g/dL (ref 2.2–3.9)
IgA/Immunoglobulin A, Serum: 354 mg/dL — ABNORMAL HIGH (ref 87–352)
IgG (Immunoglobin G), Serum: 1034 mg/dL (ref 586–1602)
IgM (Immunoglobulin M), Srm: 108 mg/dL (ref 26–217)
Total Protein: 6.8 g/dL (ref 6.0–8.5)

## 2022-06-14 LAB — VITAMIN B12: Vitamin B-12: 394 pg/mL (ref 232–1245)

## 2022-06-15 ENCOUNTER — Emergency Department (HOSPITAL_COMMUNITY): Payer: Self-pay

## 2022-06-15 ENCOUNTER — Emergency Department (HOSPITAL_COMMUNITY)
Admission: EM | Admit: 2022-06-15 | Discharge: 2022-06-15 | Disposition: A | Discharge status: Home / Self Care | Payer: Self-pay | Attending: Student | Admitting: Student

## 2022-06-15 ENCOUNTER — Telehealth (HOSPITAL_COMMUNITY): Payer: Self-pay | Admitting: Student

## 2022-06-15 ENCOUNTER — Encounter (HOSPITAL_COMMUNITY): Payer: Self-pay | Admitting: Emergency Medicine

## 2022-06-15 ENCOUNTER — Other Ambulatory Visit: Payer: Self-pay

## 2022-06-15 DIAGNOSIS — R079 Chest pain, unspecified: Secondary | ICD-10-CM | POA: Insufficient documentation

## 2022-06-15 DIAGNOSIS — R0602 Shortness of breath: Secondary | ICD-10-CM | POA: Insufficient documentation

## 2022-06-15 DIAGNOSIS — J45909 Unspecified asthma, uncomplicated: Secondary | ICD-10-CM | POA: Insufficient documentation

## 2022-06-15 DIAGNOSIS — F410 Panic disorder [episodic paroxysmal anxiety] without agoraphobia: Secondary | ICD-10-CM | POA: Insufficient documentation

## 2022-06-15 DIAGNOSIS — R110 Nausea: Secondary | ICD-10-CM | POA: Insufficient documentation

## 2022-06-15 DIAGNOSIS — F0390 Unspecified dementia without behavioral disturbance: Secondary | ICD-10-CM | POA: Insufficient documentation

## 2022-06-15 DIAGNOSIS — R209 Unspecified disturbances of skin sensation: Secondary | ICD-10-CM | POA: Insufficient documentation

## 2022-06-15 LAB — CBC WITH DIFFERENTIAL/PLATELET
Abs Immature Granulocytes: 0.05 10*3/uL (ref 0.00–0.07)
Basophils Absolute: 0.1 10*3/uL (ref 0.0–0.1)
Basophils Relative: 0 %
Eosinophils Absolute: 0.1 10*3/uL (ref 0.0–0.5)
Eosinophils Relative: 1 %
HCT: 42.7 % (ref 36.0–46.0)
Hemoglobin: 14 g/dL (ref 12.0–15.0)
Immature Granulocytes: 0 %
Lymphocytes Relative: 29 %
Lymphs Abs: 3.4 10*3/uL (ref 0.7–4.0)
MCH: 29.4 pg (ref 26.0–34.0)
MCHC: 32.8 g/dL (ref 30.0–36.0)
MCV: 89.5 fL (ref 80.0–100.0)
Monocytes Absolute: 0.9 10*3/uL (ref 0.1–1.0)
Monocytes Relative: 7 %
Neutro Abs: 7.3 10*3/uL (ref 1.7–7.7)
Neutrophils Relative %: 63 %
Platelets: 365 10*3/uL (ref 150–400)
RBC: 4.77 MIL/uL (ref 3.87–5.11)
RDW: 13.4 % (ref 11.5–15.5)
WBC: 11.8 10*3/uL — ABNORMAL HIGH (ref 4.0–10.5)
nRBC: 0 % (ref 0.0–0.2)

## 2022-06-15 LAB — COMPREHENSIVE METABOLIC PANEL
ALT: 15 U/L (ref 0–44)
AST: 16 U/L (ref 15–41)
Albumin: 3.5 g/dL (ref 3.5–5.0)
Alkaline Phosphatase: 77 U/L (ref 38–126)
Anion gap: 7 (ref 5–15)
BUN: 13 mg/dL (ref 6–20)
CO2: 26 mmol/L (ref 22–32)
Calcium: 8.7 mg/dL — ABNORMAL LOW (ref 8.9–10.3)
Chloride: 108 mmol/L (ref 98–111)
Creatinine, Ser: 0.85 mg/dL (ref 0.44–1.00)
GFR, Estimated: >60 mL/min (ref >60)
Glucose, Bld: 116 mg/dL — ABNORMAL HIGH (ref 70–99)
Potassium: 4 mmol/L (ref 3.5–5.1)
Sodium: 141 mmol/L (ref 135–145)
Total Bilirubin: 0.5 mg/dL (ref 0.3–1.2)
Total Protein: 7 g/dL (ref 6.5–8.1)

## 2022-06-15 LAB — TROPONIN I (HIGH SENSITIVITY)
Troponin I (High Sensitivity): 6 ng/L (ref <18)
Troponin I (High Sensitivity): 4 ng/L (ref <18)

## 2022-06-15 LAB — D-DIMER, QUANTITATIVE: D-Dimer, Quant: 0.75 ug/mL-FEU — ABNORMAL HIGH (ref 0.00–0.50)

## 2022-06-15 LAB — BRAIN NATRIURETIC PEPTIDE: B Natriuretic Peptide: 28 pg/mL (ref 0.0–100.0)

## 2022-06-15 MED ORDER — ONDANSETRON HCL 4 MG/2ML IJ SOLN
4.0000 mg | Freq: Once | INTRAMUSCULAR | Status: AC
  Administered 2022-06-15: 4 mg via INTRAVENOUS
  Filled 2022-06-15: qty 2

## 2022-06-15 MED ORDER — IOHEXOL 350 MG/ML SOLN
100.0000 mL | Freq: Once | INTRAVENOUS | Status: AC | PRN
  Administered 2022-06-15: 100 mL via INTRAVENOUS

## 2022-06-15 MED ORDER — LORAZEPAM 2 MG/ML IJ SOLN
1.0000 mg | Freq: Once | INTRAMUSCULAR | Status: DC | PRN
Start: 2022-06-15 — End: 2022-06-15
  Filled 2022-06-15: qty 1

## 2022-06-15 MED ORDER — LORAZEPAM 0.5 MG PO TABS
0.5000 mg | ORAL_TABLET | Freq: Once | ORAL | Status: AC
  Administered 2022-06-15: 0.5 mg via ORAL
  Filled 2022-06-15: qty 1

## 2022-06-15 MED ORDER — HYDROXYZINE HCL 25 MG PO TABS: 25.0000 mg | ORAL_TABLET | Freq: Three times a day (TID) | ORAL | 0 refills | Status: DC | PRN

## 2022-06-15 MED ORDER — LORAZEPAM 2 MG/ML IJ SOLN
0.5000 mg | Freq: Once | INTRAMUSCULAR | Status: AC | PRN
Start: 2022-06-15 — End: 2022-06-15
  Administered 2022-06-15: 0.5 mg via INTRAVENOUS
  Filled 2022-06-15: qty 1

## 2022-06-15 MED ORDER — GADOBUTROL 1 MMOL/ML IV SOLN
10.0000 mL | Freq: Once | INTRAVENOUS | Status: AC | PRN
  Administered 2022-06-15: 10 mL via INTRAVENOUS

## 2022-06-15 NOTE — ED Provider Notes (Addendum)
[facility] EMERGENCY DEPARTMENT Provider Note  CSN: [id number] Arrival date & time: 06/15/22 2841  Chief Complaint(s) Shortness of Breath  HPI Kristine Miller is a 61 y.o. female with PMH anxiety, asthma, previous PE no longer on anticoagulation, previous diagnosis of MS that is currently in question and she is not on current MS treatment who presents emergency department for evaluation of multiple complaints including chest pain, shortness of breath, arm tingling, nausea.  Patient states that she awoke at approximately 4 AM this morning with acute onset chest pain, shortness of breath, palpitations and left arm tingling with associated nausea but no vomiting.  She states that she is primarily worried about her heart and possibly a blood clot as she has had this before.  Patient does endorse significant increased stressors over the last 2 months as she has started taking care of her mother who has been diagnosed with dementia.  Denies illicit substance use.  Of note, patient states that she has a diagnostic mammogram next week due to concerning lesion in her left breast and possibly a melanomatous lesion near the nipple on the left breast.   Past Medical History Past Medical History:  Diagnosis Date   Anxiety    Asthma    Chronic headaches    IBS (irritable bowel syndrome)    MS (multiple sclerosis) ([place]) 2014   Palpitations    Pneumonia    Rotator cuff arthropathy, left    Patient Active Problem List   Diagnosis Date Noted   Gait disturbance 06/08/2022   Pulmonary embolism ([place]) 08/18/2021   QT prolongation 08/18/2021   Change in mole 02/24/2017   Cellulitis of chest wall 02/24/2017   Numbness 03/16/2016   Left-sided weakness 03/16/2016   MS ([place]) [place] 04/28/2014   Multiple sclerosis exacerbation ([place]) 04/28/2014   Difficulty in walking(719.7) 08/23/2013   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[place]) 08/23/2013   White matter abnormality on MRI of brain  08/20/2013   Anxiety state, unspecified 08/19/2013   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 Authorizing Provider  hydrOXYzine (ATARAX) 25 MG tablet Take 1 tablet (25 mg total) by mouth every 8 (eight) hours as needed for anxiety. 06/15/22  Yes Demetrice Amstutz, MD  LORazepam (ATIVAN) 1 MG tablet Take 2 to 3 pills as needed prior to MRi Patient not taking: Reported on 06/15/2022 06/08/22   Britt Bottom, MD                                                                                                                                    Past Surgical History Past Surgical History:  Procedure Laterality Date   ABDOMINAL HYSTERECTOMY     APPENDECTOMY  CESAREAN SECTION     CHOLECYSTECTOMY     plate and pin in right arm     ROTATOR CUFF REPAIR     Family History Family History  Problem Relation Age of Onset   Dementia Mother    Cancer Mother    Diabetes Mother    Other Sister        blood clot in leg   Cancer Maternal Grandmother    Cancer Maternal Grandfather    Congestive Heart Failure Maternal Grandfather    Diabetes type I Daughter    Other Daughter        stomach issue   Seizures Son     Social History Social History   Tobacco Use   Smoking status: Never   Smokeless tobacco: Never  Substance Use Topics   Alcohol use: No   Drug use: No    Comment: CBD   Allergies Morphine and related, Penicillins, and Prednisone  Review of Systems Review of Systems  Respiratory:  Positive for shortness of breath.   Cardiovascular:  Positive for chest pain and palpitations.  Gastrointestinal:  Positive for nausea.  Neurological:  Positive for numbness.    Physical Exam Vital Signs  I have reviewed the triage vital  signs BP (!) 132/59   Pulse 70   Temp 98 F (36.7 C) (Oral)   Resp (!) 25   Ht 5' (1.524 m)   Wt 102.5 kg   SpO2 90%   BMI 44.14 kg/m   Physical Exam Vitals and nursing note reviewed.  Constitutional:      General: She is not in acute distress.    Appearance: She is well-developed.  HENT:     Head: Normocephalic and atraumatic.  Eyes:     Conjunctiva/sclera: Conjunctivae normal.  Cardiovascular:     Rate and Rhythm: Normal rate and regular rhythm.     Heart sounds: No murmur heard. Pulmonary:     Effort: Pulmonary effort is normal. No respiratory distress.     Breath sounds: Normal breath sounds.  Abdominal:     Palpations: Abdomen is soft.     Tenderness: There is no abdominal tenderness.  Musculoskeletal:        General: No swelling.     Cervical back: Neck supple.  Skin:    General: Skin is warm and dry.     Capillary Refill: Capillary refill takes less than 2 seconds.  Neurological:     General: No focal deficit present.     Mental Status: She is alert.     Cranial Nerves: No cranial nerve deficit.     Motor: No weakness.  Psychiatric:        Mood and Affect: Mood normal.     ED Results and Treatments Labs (all labs ordered are listed, but only abnormal results are displayed) Labs Reviewed  COMPREHENSIVE METABOLIC PANEL - Abnormal; Notable for the following components:      Result Value   Glucose, Bld 116 (*)    Calcium 8.7 (*)    All other components within normal limits  CBC WITH DIFFERENTIAL/PLATELET - Abnormal; Notable for the following components:   WBC 11.8 (*)    All other components within normal limits  D-DIMER, QUANTITATIVE - Abnormal; Notable for the following components:   D-Dimer, Quant 0.75 (*)    All other components within normal limits  BRAIN NATRIURETIC PEPTIDE  TROPONIN I (HIGH SENSITIVITY)  TROPONIN I (HIGH SENSITIVITY)  Radiology MR Cervical Spine W or Wo Contrast  Result Date: 06/15/2022 CLINICAL DATA:  Multiple sclerosis, head pain and left arm numbness today EXAM: MRI CERVICAL AND THORACIC SPINE WITHOUT AND WITH CONTRAST TECHNIQUE: Multiplanar and multiecho pulse sequences of the cervical spine, to include the craniocervical junction and cervicothoracic junction, and the thoracic spine, were obtained without and with intravenous contrast. CONTRAST:  12m GADAVIST GADOBUTROL 1 MMOL/ML IV SOLN COMPARISON:  MRI cervical spine 03/17/2016, no prior MRI thoracic spine. FINDINGS: MRI CERVICAL SPINE FINDINGS Alignment: Straightening of the normal cervical lordosis. No listhesis. Vertebrae: No acute fracture or suspicious osseous lesion. No abnormal enhancement. Cord: Normal signal and morphology. No abnormal T2 hyperintense signal. No abnormal contrast enhancement. Posterior Fossa, vertebral arteries, paraspinal tissues: Negative. Disc levels: No significant disc bulges, spinal canal stenosis or neural foraminal narrowing. MRI THORACIC SPINE FINDINGS Alignment: Mild S shaped curvature of the thoracolumbar spine. No listhesis. Vertebrae: No acute fracture or suspicious osseous lesion. T1 and T2 hyperintense lesions in the vertebral bodies, compatible with benign hemangiomas, most prominently at T6 and L1. No abnormal osseous enhancement. Cord: Normal cord signal and morphology. No abnormal T2 hyperintense signal. No abnormal contrast enhancement. Paraspinal and other soft tissues: Tiny renal cysts. Otherwise negative. Disc levels: No significant disc bulges, spinal canal stenosis, or neural foraminal narrowing. IMPRESSION: 1. No evidence of demyelinating disease in the cervical or thoracic spinal cord. 2. No spinal canal stenosis or neural foraminal narrowing in the cervical or thoracic spine. Electronically Signed   By: AMerilyn BabaM.D.   On: 06/15/2022 11:57   MR THORACIC SPINE W WO CONTRAST  Result Date:  06/15/2022 CLINICAL DATA:  Multiple sclerosis, head pain and left arm numbness today EXAM: MRI CERVICAL AND THORACIC SPINE WITHOUT AND WITH CONTRAST TECHNIQUE: Multiplanar and multiecho pulse sequences of the cervical spine, to include the craniocervical junction and cervicothoracic junction, and the thoracic spine, were obtained without and with intravenous contrast. CONTRAST:  135mGADAVIST GADOBUTROL 1 MMOL/ML IV SOLN COMPARISON:  MRI cervical spine 03/17/2016, no prior MRI thoracic spine. FINDINGS: MRI CERVICAL SPINE FINDINGS Alignment: Straightening of the normal cervical lordosis. No listhesis. Vertebrae: No acute fracture or suspicious osseous lesion. No abnormal enhancement. Cord: Normal signal and morphology. No abnormal T2 hyperintense signal. No abnormal contrast enhancement. Posterior Fossa, vertebral arteries, paraspinal tissues: Negative. Disc levels: No significant disc bulges, spinal canal stenosis or neural foraminal narrowing. MRI THORACIC SPINE FINDINGS Alignment: Mild S shaped curvature of the thoracolumbar spine. No listhesis. Vertebrae: No acute fracture or suspicious osseous lesion. T1 and T2 hyperintense lesions in the vertebral bodies, compatible with benign hemangiomas, most prominently at T6 and L1. No abnormal osseous enhancement. Cord: Normal cord signal and morphology. No abnormal T2 hyperintense signal. No abnormal contrast enhancement. Paraspinal and other soft tissues: Tiny renal cysts. Otherwise negative. Disc levels: No significant disc bulges, spinal canal stenosis, or neural foraminal narrowing. IMPRESSION: 1. No evidence of demyelinating disease in the cervical or thoracic spinal cord. 2. No spinal canal stenosis or neural foraminal narrowing in the cervical or thoracic spine. Electronically Signed   By: AlMerilyn Baba.D.   On: 06/15/2022 11:57   CT Angio Chest PE W and/or Wo Contrast  Addendum Date: 06/15/2022   ADDENDUM REPORT: 06/15/2022 08:57 CONTRAST:  80 mL Omnipaque  350, intravenous Electronically Signed   By: DyRuthann Cancer.D.   On: 06/15/2022 08:57   Result Date: 06/15/2022 CLINICAL DATA:  [age]ear old female with concern for pulmonary embolism. EXAM: CT ANGIOGRAPHY CHEST  WITH CONTRAST TECHNIQUE: Multidetector CT imaging of the chest was performed using the standard protocol during bolus administration of intravenous contrast. Multiplanar CT image reconstructions and MIPs were obtained to evaluate the vascular anatomy. RADIATION DOSE REDUCTION: This exam was performed according to the departmental dose-optimization program which includes automated exposure control, adjustment of the mA and/or kV according to patient size and/or use of iterative reconstruction technique. CONTRAST:  Eighty mL , intravenous COMPARISON:  02/14/2022 FINDINGS: Cardiovascular: Satisfactory opacification of the pulmonary arteries to the segmental level. No evidence of pulmonary embolism. Normal heart size. No pericardial effusion. Mediastinum/Nodes: No enlarged mediastinal, hilar, or axillary lymph nodes. Thyroid gland, trachea, and esophagus demonstrate no significant findings. Lungs/Pleura: Mild basilar dependent mosaic attenuation, likely secondary to expiratory phase image acquisition. No focal consolidations. No suspicious pulmonary nodules. No pleural effusion or pneumothorax. Upper Abdomen: The visualized upper abdomen is within normal limits. Musculoskeletal: No chest wall abnormality. No acute or significant osseous findings. Review of the MIP images confirms the above findings. IMPRESSION: Vascular: No evidence of pulmonary embolism. Non-Vascular: No acute intrathoracic abnormality. Ruthann Cancer, MD [facility] Radiology Electronically Signed: By: Ruthann Cancer M.D. On: 06/15/2022 08:49   DG Chest Portable 1 View  Result Date: 06/15/2022 CLINICAL DATA:  Chest pain, shortness of breath, nausea and abdominal pain. EXAM: PORTABLE CHEST 1  VIEW COMPARISON:  02/14/2022 FINDINGS: Stable cardiomediastinal contours. No pleural effusion or edema. No airspace opacities identified. The visualized osseous structures are unremarkable. IMPRESSION: No active disease. Electronically Signed   By: Kerby Moors M.D.   On: 06/15/2022 07:36    Pertinent labs & imaging results that were available during my care of the patient were reviewed by me and considered in my medical decision making (see MDM for details).  Medications Ordered in ED Medications  ondansetron (ZOFRAN) injection 4 mg (4 mg Intravenous Given 06/15/22 0731)  iohexol (OMNIPAQUE) 350 MG/ML injection 100 mL (100 mLs Intravenous Contrast Given 06/15/22 0832)  LORazepam (ATIVAN) tablet 0.5 mg (0.5 mg Oral Given 06/15/22 0926)  gadobutrol (GADAVIST) 1 MMOL/ML injection 10 mL (10 mLs Intravenous Contrast Given 06/15/22 1142)  LORazepam (ATIVAN) injection 0.5 mg (0.5 mg Intravenous Given 06/15/22 1218)                                                                                                                                     Procedures Procedures  (including critical care time)  Medical Decision Making / ED Course   This patient presents to the ED for concern of chest pain, dyspnea numbness, nausea, this involves an extensive number of treatment options, and is a complaint that carries with it a high risk of complications and morbidity.  The differential diagnosis includes panic attack, MS flare, PE, pneumonia, ACS, PTX  MDM: Patient seen emergency room for evaluation of multiple complaints as described above.  Physical exam is largley unremarkable with no focal motor  or sensory deficits, but she does have a 3 cm lesion to the left of the left nipple that is concerning for melanoma.  Laboratory evaluation largely unremarkable outside of a mild leukocytosis to 11.8.  Troponin and delta troponin unremarkable.  Chest x-ray unremarkable.  D-dimer slightly elevated 0.75 and CT PE  unremarkable.  Patient's initial constellation of symptoms sound like a panic attack as she has had significant increase in stressors at home and woke with the constellation of symptoms described above.  We did an Ativan trial and her chest pain resolved but on reevaluation she states that she is currently feeling the "MS hug" in the lower thoracic spine and thus I consulted neurology and spoke with Dr. Curly Shores who recommends MRI C-spine and T-spine.  These scans were performed and she reassuringly does not have any evidence of MS or spinal stenosis.  Patient will need to follow-up outpatient with her primary care physician to discuss anxiety management and she was discharged with a prescription for Atarax.  Heart score is 2.  She was given return precautions of which she voiced understanding and discharged with outpatient dermatology and PCP follow-up.   Additional history obtained: -Additional history obtained from husband -External records from outside source obtained and reviewed including: Chart review including previous notes, labs, imaging, consultation notes   Lab Tests: -I ordered, reviewed, and interpreted labs.   The pertinent results include:   Labs Reviewed  COMPREHENSIVE METABOLIC PANEL - Abnormal; Notable for the following components:      Result Value   Glucose, Bld 116 (*)    Calcium 8.7 (*)    All other components within normal limits  CBC WITH DIFFERENTIAL/PLATELET - Abnormal; Notable for the following components:   WBC 11.8 (*)    All other components within normal limits  D-DIMER, QUANTITATIVE - Abnormal; Notable for the following components:   D-Dimer, Quant 0.75 (*)    All other components within normal limits  BRAIN NATRIURETIC PEPTIDE  TROPONIN I (HIGH SENSITIVITY)  TROPONIN I (HIGH SENSITIVITY)      EKG   EKG Interpretation  Date/Time:  Wednesday June 15 2022 06:36:34 EDT Ventricular Rate:  77 PR Interval:  166 QRS Duration: 104 QT Interval:  370 QTC  Calculation: 419 R Axis:   -36 Text Interpretation: Sinus rhythm Left axis deviation No significant change since last tracing Confirmed by [place] ([id number]) on 06/15/2022 8:00:51 AM         Imaging Studies ordered: I ordered imaging studies including CT PE, chest x-ray, MRI C and T-spine I independently visualized and interpreted imaging. I agree with the radiologist interpretation   Medicines ordered and prescription drug management: Meds ordered this encounter  Medications   ondansetron (ZOFRAN) injection 4 mg   iohexol (OMNIPAQUE) 350 MG/ML injection 100 mL   LORazepam (ATIVAN) tablet 0.5 mg   DISCONTD: LORazepam (ATIVAN) injection 1 mg   gadobutrol (GADAVIST) 1 MMOL/ML injection 10 mL   LORazepam (ATIVAN) injection 0.5 mg   hydrOXYzine (ATARAX) 25 MG tablet    Sig: Take 1 tablet (25 mg total) by mouth every 8 (eight) hours as needed for anxiety.    Dispense:  30 tablet    Refill:  0    -I have reviewed the patients home medicines and have made adjustments as needed  Critical interventions none  Consultations Obtained: I requested consultation with the neurologist,  and discussed lab and imaging findings as well as pertinent plan - they recommend: MRI C-spine, T-spine   Cardiac  Monitoring: The patient was maintained on a cardiac monitor.  I personally viewed and interpreted the cardiac monitored which showed an underlying rhythm of: NSR  Social Determinants of Health:  Factors impacting patients care include: Significant increased stressors at home   Reevaluation: After the interventions noted above, I reevaluated the patient and found that they have :improved  Co morbidities that complicate the patient evaluation  Past Medical History:  Diagnosis Date   Anxiety    Asthma    Chronic headaches    IBS (irritable bowel syndrome)    MS (multiple sclerosis) ([place]) 2014   Palpitations    Pneumonia    Rotator cuff arthropathy, left       Dispostion: I  considered admission for this patient, but she currently does not meet inpatient criteria for admission is safe for discharge to outpatient follow-up.     Final Clinical Impression(s) / ED Diagnoses Final diagnoses:  Chest pain, unspecified type  Panic attack     '@PCDICTATION'$ @    Teressa Lower, MD 06/15/22 [place], MD 06/15/22 1242

## 2022-06-15 NOTE — ED Triage Notes (Signed)
Pt states she woke up at 0230 with sob and chest pain with nausea and abd pain. She states she laid back down and got up at 0530 with  head pain and left arm numbness.

## 2022-06-17 ENCOUNTER — Telehealth: Payer: Self-pay | Admitting: Neurology

## 2022-06-17 NOTE — Telephone Encounter (Signed)
Pt said went to [facility] ED on Wednesday, 8/2. They did MRI of thoracic spinal. Asking If need to still keep the MRI appt on 08/06/22. Would like a call from the nurse.

## 2022-06-17 NOTE — Telephone Encounter (Signed)
Called the patient and advised per imaging completed it looks like she had MRI of thoracic spine and cervical spine completed. I do not see MRI of brain done. Informed her she can call [place] [facility] imaging and cancel those two but she should keep the MRI of brain that was ordered. Pt verbalized understanding. Pt had no questions at this time but was encouraged to call back if questions arise.

## 2022-06-21 ENCOUNTER — Ambulatory Visit (HOSPITAL_COMMUNITY)
Admission: RE | Admit: 2022-06-21 | Discharge: 2022-06-21 | Disposition: A | Discharge status: Home / Self Care | Payer: Self-pay | Source: Ambulatory Visit | Attending: Nurse Practitioner | Admitting: Nurse Practitioner

## 2022-06-21 ENCOUNTER — Ambulatory Visit (HOSPITAL_COMMUNITY): Admission: RE | Admit: 2022-06-21 | Payer: Self-pay | Source: Ambulatory Visit

## 2022-06-21 DIAGNOSIS — L988 Other specified disorders of the skin and subcutaneous tissue: Secondary | ICD-10-CM | POA: Insufficient documentation

## 2022-06-21 DIAGNOSIS — N644 Mastodynia: Secondary | ICD-10-CM | POA: Insufficient documentation

## 2022-07-21 ENCOUNTER — Ambulatory Visit: Payer: Self-pay | Admitting: Nurse Practitioner

## 2022-08-06 ENCOUNTER — Other Ambulatory Visit: Payer: Self-pay

## 2022-09-21 ENCOUNTER — Ambulatory Visit: Payer: Self-pay | Admitting: Neurology

## 2023-03-30 ENCOUNTER — Other Ambulatory Visit: Payer: Self-pay

## 2023-03-30 ENCOUNTER — Emergency Department (HOSPITAL_COMMUNITY): Payer: Self-pay

## 2023-03-30 ENCOUNTER — Emergency Department (HOSPITAL_COMMUNITY)
Admission: EM | Admit: 2023-03-30 | Discharge: 2023-03-30 | Disposition: A | Discharge status: Home / Self Care | Payer: Self-pay | Attending: Emergency Medicine | Admitting: Emergency Medicine

## 2023-03-30 DIAGNOSIS — R42 Dizziness and giddiness: Secondary | ICD-10-CM | POA: Insufficient documentation

## 2023-03-30 DIAGNOSIS — R0602 Shortness of breath: Secondary | ICD-10-CM | POA: Insufficient documentation

## 2023-03-30 DIAGNOSIS — M25572 Pain in left ankle and joints of left foot: Secondary | ICD-10-CM | POA: Insufficient documentation

## 2023-03-30 DIAGNOSIS — R079 Chest pain, unspecified: Secondary | ICD-10-CM | POA: Insufficient documentation

## 2023-03-30 LAB — BASIC METABOLIC PANEL
Anion gap: 5 (ref 5–15)
BUN: 13 mg/dL (ref 6–20)
CO2: 27 mmol/L (ref 22–32)
Calcium: 8.8 mg/dL — ABNORMAL LOW (ref 8.9–10.3)
Chloride: 107 mmol/L (ref 98–111)
Creatinine, Ser: 0.78 mg/dL (ref 0.44–1.00)
GFR, Estimated: >60 mL/min (ref >60)
Glucose, Bld: 119 mg/dL — ABNORMAL HIGH (ref 70–99)
Potassium: 3.3 mmol/L — ABNORMAL LOW (ref 3.5–5.1)
Sodium: 139 mmol/L (ref 135–145)

## 2023-03-30 LAB — CBC
HCT: 43.2 % (ref 36.0–46.0)
Hemoglobin: 14.4 g/dL (ref 12.0–15.0)
MCH: 29.4 pg (ref 26.0–34.0)
MCHC: 33.3 g/dL (ref 30.0–36.0)
MCV: 88.2 fL (ref 80.0–100.0)
Platelets: 341 10*3/uL (ref 150–400)
RBC: 4.9 MIL/uL (ref 3.87–5.11)
RDW: 14.1 % (ref 11.5–15.5)
WBC: 12.3 10*3/uL — ABNORMAL HIGH (ref 4.0–10.5)
nRBC: 0 % (ref 0.0–0.2)

## 2023-03-30 LAB — TROPONIN I (HIGH SENSITIVITY)
Troponin I (High Sensitivity): 3 ng/L (ref <18)
Troponin I (High Sensitivity): 3 ng/L (ref <18)

## 2023-03-30 MED ORDER — ACETAMINOPHEN 325 MG PO TABS
650.0000 mg | ORAL_TABLET | Freq: Once | ORAL | Status: AC
  Administered 2023-03-30: 650 mg via ORAL
  Filled 2023-03-30: qty 2

## 2023-03-30 MED ORDER — ASPIRIN 81 MG PO CHEW
324.0000 mg | CHEWABLE_TABLET | Freq: Once | ORAL | Status: AC
Start: 2023-03-30 — End: 2023-03-30
  Administered 2023-03-30: 324 mg via ORAL
  Filled 2023-03-30: qty 4

## 2023-03-30 NOTE — Discharge Instructions (Signed)
I have provided you a referral to cardiology for your chest pain.  Your labs today were normal and not indicative of anything serious going on with your heart.  Your ankle x-ray was normal.  If you to take 600 mg of ibuprofen every 6 hours as needed for pain and swelling. You may return to emergency room for any worsening symptoms.

## 2023-03-30 NOTE — ED Triage Notes (Addendum)
Pt states she went to the dentist Monday and had a systolic BP in the 200s.  Pt has had high BP throughout the week and now feels like she is weak and her "heart is struggling to keep up"  Tuesday night woke from sleep with tachycardia and diaphoresis.  Reports pain to left ankle/heel and has moderate swelling to that site.  Pt is concerned this pain has caused her high BP   Pt states she has a questionable diagnosis of MS as well.

## 2023-03-30 NOTE — ED Provider Triage Note (Signed)
Emergency Medicine Provider Triage Evaluation Note  Kristine Miller , a 62 y.o. female  was evaluated in triage.  Pt complains of high blood pressure and palpitations for the past 4 days, as well as chest pressure for the past 2 days. Patient reports symptoms are brought on by stress, caffeine, and chocolate and are relieved with stress reduction. She has not taken any taken any medication aside from CBD oil. She does see any doctors regularly.  Review of Systems  Positive: Palpitations, chest pressure, intermittent right eye blurriness Negative:   Physical Exam  BP (!) 155/106 (BP Location: Right Arm)   Pulse 88   Temp 98.7 F (37.1 C) (Oral)   Resp 18   SpO2 99%  Gen:   Awake, no distress   Resp:  Normal effort  MSK:   Moves extremities without difficulty  Other:    Medical Decision Making  Medically screening exam initiated at 7:07 PM.  Appropriate orders placed.  [name] was informed that the remainder of the evaluation will be completed by another provider, this initial triage assessment does not replace that evaluation, and the importance of remaining in the ED until their evaluation is complete.     Maxwell Marion, PA-C 03/30/23 [id number]

## 2023-03-30 NOTE — ED Provider Notes (Signed)
Kristine Miller Provider Note   CSN: [id number] Arrival date & time: 03/30/23  1824     History Chief Complaint  Patient presents with   Chest Pain    Kristine Miller is a 62 y.o. female patient who presents to the emergency department today for further evaluation of left-sided chest pain.  She describes this pain as a dull ache sensation.  It is been present for 2 days.  She also states that her blood pressure has been elevated since Monday.  She does not take any antihypertensive medication.  She states that her chest pain is worse with exertion and she does feel lightheaded and short of breath when she is exerting herself.  She denies any syncope, fever, chills, cough, abdominal pain, nausea, vomiting, and diarrhea.   Chest Pain      Home Medications Prior to Admission medications   Medication Sig Start Date End Date Taking? Authorizing Provider  Aspirin-Salicylamide-Caffeine (BC HEADACHE POWDER PO) Take 1 packet by mouth as needed (pain).   Yes [provider]      Allergies    Morphine and codeine, Penicillins, and Prednisone    Review of Systems   Review of Systems  Cardiovascular:  Positive for chest pain.  All other systems reviewed and are negative.   Physical Exam Updated Vital Signs BP 129/82   Pulse 69   Temp 98.8 F (37.1 C) (Oral)   Resp 16   SpO2 96%  Physical Exam Vitals and nursing note reviewed.  Constitutional:      General: She is not in acute distress.    Appearance: Normal appearance. She is obese.  HENT:     Head: Normocephalic and atraumatic.  Eyes:     General:        Right eye: No discharge.        Left eye: No discharge.  Cardiovascular:     Comments: Regular rate and rhythm.  S1/S2 are distinct without any evidence of murmur, rubs, or gallops.  Radial pulses are 2+ bilaterally.  Dorsalis pedis pulses are 2+ bilaterally.  No evidence of pedal edema. Pulmonary:     Comments: Clear to  auscultation bilaterally.  Normal effort.  No respiratory distress.  No evidence of wheezes, rales, or rhonchi heard throughout. Abdominal:     General: Abdomen is flat. Bowel sounds are normal. There is no distension.     Tenderness: There is no abdominal tenderness. There is no guarding or rebound.  Musculoskeletal:        General: Normal range of motion.     Cervical back: Neck supple.  Skin:    General: Skin is warm and dry.     Findings: No rash.  Neurological:     General: No focal deficit present.     Mental Status: She is alert.  Psychiatric:        Mood and Affect: Mood normal.        Behavior: Behavior normal.     ED Results / Procedures / Treatments   Labs (all labs ordered are listed, but only abnormal results are displayed) Labs Reviewed  BASIC METABOLIC PANEL - Abnormal; Notable for the following components:      Result Value   Potassium 3.3 (*)    Glucose, Bld 119 (*)    Calcium 8.8 (*)    All other components within normal limits  CBC - Abnormal; Notable for the following components:   WBC 12.3 (*)  All other components within normal limits  TROPONIN I (HIGH SENSITIVITY)  TROPONIN I (HIGH SENSITIVITY)    EKG EKG Interpretation  Date/Time:  Thursday Mar 30 2023 18:37:50 EDT Ventricular Rate:  88 PR Interval:  158 QRS Duration: 90 QT Interval:  352 QTC Calculation: 425 R Axis:   132 Text Interpretation: Sinus rhythm with marked sinus arrhythmia Left posterior fascicular block Cannot rule out Anterior infarct , age undetermined Abnormal ECG When compared with ECG of 15-Jun-2022 06:36, PREVIOUS ECG IS PRESENT Confirmed by Eber Hong ([id number]) on 03/30/2023 7:46:36 PM  Radiology DG Ankle Complete Left  Result Date: 03/30/2023 CLINICAL DATA:  Swelling/injury EXAM: LEFT ANKLE COMPLETE - 3+ VIEW COMPARISON:  None Available. FINDINGS: There is no evidence of fracture, dislocation. Possible small ankle joint effusion. Plantar and posterior calcaneal spur.  Subcutaneus soft tissue edema. IMPRESSION: 1. No acute displaced fracture or dislocation. 2. Possible small ankle joint effusion. Electronically Signed   By: Tish Frederickson M.D.   On: 03/30/2023 19:03   DG Chest 2 View  Result Date: 03/30/2023 CLINICAL DATA:  CP EXAM: CHEST - 2 VIEW COMPARISON:  Chest x-ray 06/15/2022, CT chest 06/15/2022 FINDINGS: The heart and mediastinal contours are unchanged. Low lung volumes. No focal consolidation. No pulmonary edema. No pleural effusion. No pneumothorax. No acute osseous abnormality. IMPRESSION: Low lung volumes with no active cardiopulmonary disease. Electronically Signed   By: Tish Frederickson M.D.   On: 03/30/2023 19:01    Procedures Procedures    Medications Ordered in ED Medications  aspirin chewable tablet 324 mg (324 mg Oral Given 03/30/23 1958)  acetaminophen (TYLENOL) tablet 650 mg (650 mg Oral Given 03/30/23 2116)    ED Course/ Medical Decision Making/ A&P Clinical Course as of 03/30/23 2204  Thu Mar 30, 2023  2019 CBC(!) There is evidence of leukocytosis. [CF]  2020 Basic metabolic panel(!) Mild hypokalemia elevated glucose. [CF]  2020 Troponin I (High Sensitivity) Initial troponin is normal. [CF]  2024 DG Ankle Complete Left I personally ordered and interpreted the study and denies any evidence of ankle fracture.  I do agree with radiologist interpretation. [CF]  2024 DG Chest 2 View I do agree with radiologist interpretation.  I do not see any evidence of pneumonia, pleural effusion, or cardiomegaly. [CF]  2143 Troponin I (High Sensitivity) Delta troponin is normal.  [CF]    Clinical Course User Index [CF] Kristine Lower, PA-C   {   Click here for ABCD2, HEART and other calculators  Medical Decision Making TAYTEN CRITTENDEN is a 62 y.o. female patient who presents to the emergency department today for further evaluation of left-sided chest pain.  Patient is hypertensive but the rest of her vital signs are completely normal.   I will plan to get some cardiac enzymes and basic labs.  Cardiac enzymes were normal in addition to her blood work apart from some hypokalemia.  I have given her a referral to cardiology.  I have a low suspicion this is cardiac related at this time.  She was encouraged to return to the emergency room for any worsening symptoms.  Strict return precaution were discussed.  She is safe for discharge.  Amount and/or Complexity of Data Reviewed Labs: ordered. Decision-making details documented in ED Course. Radiology: ordered. Decision-making details documented in ED Course.  Risk OTC drugs.   Final Clinical Impression(s) / ED Diagnoses Final diagnoses:  Chest pain, unspecified type  Acute left ankle pain    Rx / DC Orders ED Discharge Orders  Ordered    Ambulatory referral to Cardiology        03/30/23 2148              Jolyn Lent 03/30/23 2204    Eber Hong, MD 04/01/23 [phone]

## 2023-03-31 ENCOUNTER — Telehealth: Payer: Self-pay

## 2023-03-31 NOTE — Transitions of Care (Post Inpatient/ED Visit) (Unsigned)
   03/31/2023  Name: Kristine Miller MRN: [id number] DOB: [birth year]  Today's TOC FU Call Status: Today's TOC FU Call Status:: Unsuccessul Call (1st Attempt) Unsuccessful Call (1st Attempt) Date: 03/31/23  Attempted to reach the patient regarding the most recent Inpatient/ED visit.  Follow Up Plan: Additional outreach attempts will be made to reach the patient to complete the Transitions of Care (Post Inpatient/ED visit) call.   Signature Karena Addison, LPN [facility] Nurse Health Advisor Direct Dial [phone]

## 2023-04-03 NOTE — Transitions of Care (Post Inpatient/ED Visit) (Signed)
   04/03/2023  Name: Kristine Miller MRN: [id number] DOB: 22-May-1961  Today's TOC FU Call Status: Today's TOC FU Call Status:: Successful TOC FU Call Competed Unsuccessful Call (1st Attempt) Date: 03/31/23 [facility] FU Call Complete Date: 04/03/23  Transition Care Management Follow-up Telephone Call Date of Discharge: 03/30/23 [facility] Type of Discharge: Emergency Department Reason for ED Visit: Other: (chest pain) How have you been since you were released from the hospital?: Better Any questions or concerns?: No  Items Reviewed: Did you receive and understand the discharge instructions provided?: Yes Medications obtained,verified, and reconciled?: Yes (Medications Reviewed) Any new allergies since your discharge?: No Dietary orders reviewed?: Yes Do you have support at home?: Yes People in Home: spouse  Medications Reviewed Today: Medications Reviewed Today     Reviewed by Karena Addison, LPN (Licensed Practical Nurse) on 04/03/23 at 1611  Med List Status: <None>   Medication Order Taking? Sig Documenting Provider Last Dose Status Informant  Aspirin-Salicylamide-Caffeine (BC HEADACHE POWDER PO) [id number] Yes Take 1 packet by mouth as needed (pain). [provider] Taking Active Self, Pharmacy Records  Med List Note Abbe Amsterdam, Macarthur Critchley, CPhT 03/16/16 1028): Patient uses essential oils (DDR) to aid in treatment of MS in addition            Home Care and Equipment/Supplies: Were Home Health Services Ordered?: NA Any new equipment or medical supplies ordered?: NA  Functional Questionnaire: Do you need assistance with bathing/showering or dressing?: No Do you need assistance with meal preparation?: No Do you need assistance with eating?: No Do you have difficulty maintaining continence: No Do you need assistance with getting out of bed/getting out of a chair/moving?: No Do you have difficulty managing or taking your medications?:  No  Follow up appointments reviewed: PCP Follow-up appointment confirmed?: NA Specialist Hospital Follow-up appointment confirmed?: No Reason Specialist Follow-Up Not Confirmed: Patient has Specialist Provider Number and will Call for Appointment (has call into Dr Wyline Mood cardio) Do you need transportation to your follow-up appointment?: No Do you understand care options if your condition(s) worsen?: Yes-patient verbalized understanding    SIGNATURE Karena Addison, LPN [facility] Nurse Health Advisor Direct Dial [phone]

## 2023-05-31 ENCOUNTER — Ambulatory Visit: Payer: Self-pay | Admitting: Cardiology

## 2023-08-03 ENCOUNTER — Telehealth: Payer: Self-pay | Admitting: Cardiology

## 2023-08-03 ENCOUNTER — Ambulatory Visit: Payer: Self-pay | Attending: Cardiology

## 2023-08-03 ENCOUNTER — Other Ambulatory Visit: Payer: Self-pay | Admitting: Cardiology

## 2023-08-03 ENCOUNTER — Encounter: Payer: Self-pay | Admitting: Cardiology

## 2023-08-03 ENCOUNTER — Ambulatory Visit: Payer: Self-pay | Attending: Cardiology | Admitting: Cardiology

## 2023-08-03 VITALS — BP 138/90 | HR 69 | Ht 61.0 in | Wt 232.8 lb

## 2023-08-03 DIAGNOSIS — R002 Palpitations: Secondary | ICD-10-CM

## 2023-08-03 DIAGNOSIS — R079 Chest pain, unspecified: Secondary | ICD-10-CM

## 2023-08-03 DIAGNOSIS — Z79899 Other long term (current) drug therapy: Secondary | ICD-10-CM

## 2023-08-03 NOTE — Progress Notes (Signed)
Clinical Summary Kristine Miller is a 62 y.o.female seen today as a new patient for the following medical problems.   1.Chest pain/palpitations - ER visit 03/2023 - trops neg, EKG no ischemic changes - feeeling of heart racing. Occurs daily, can occur at rest or with activity - can last a few hours, sometimes can last a day - associated headache, slight changes in breathing - working to cut back on caffeine. Caffeine free sodas, coffee rare, rare tea - tend to be worst with caffeine or stress     2. Anxiety Past Medical History:  Diagnosis Date   Anxiety    Asthma    Chronic headaches    IBS (irritable bowel syndrome)    MS (multiple sclerosis) (HCC) 2014   Palpitations    Pneumonia    Rotator cuff arthropathy, left      Allergies  Allergen Reactions   Morphine And Codeine Swelling   Penicillins Nausea And Vomiting    Has patient had a PCN reaction causing immediate rash, facial/tongue/throat swelling, SOB or lightheadedness with hypotension: No Has patient had a PCN reaction causing severe rash involving mucus membranes or skin necrosis: No Has patient had a PCN reaction that required hospitalization No Has patient had a PCN reaction occurring within the last 10 years: No If all of the above answers are "NO", then may proceed with Cephalosporin use.    Prednisone Nausea And Vomiting     Current Outpatient Medications  Medication Sig Dispense Refill   Aspirin-Salicylamide-Caffeine (BC HEADACHE POWDER PO) Take 1 packet by mouth as needed (pain).     No current facility-administered medications for this visit.     Past Surgical History:  Procedure Laterality Date   ABDOMINAL HYSTERECTOMY     APPENDECTOMY     CESAREAN SECTION     CHOLECYSTECTOMY     plate and pin in right arm     ROTATOR CUFF REPAIR       Allergies  Allergen Reactions   Morphine And Codeine Swelling   Penicillins Nausea And Vomiting    Has patient had a PCN reaction causing  immediate rash, facial/tongue/throat swelling, SOB or lightheadedness with hypotension: No Has patient had a PCN reaction causing severe rash involving mucus membranes or skin necrosis: No Has patient had a PCN reaction that required hospitalization No Has patient had a PCN reaction occurring within the last 10 years: No If all of the above answers are "NO", then may proceed with Cephalosporin use.    Prednisone Nausea And Vomiting      Family History  Problem Relation Age of Onset   Dementia Mother    Cancer Mother    Diabetes Mother    Other Sister        blood clot in leg   Cancer Maternal Grandmother    Cancer Maternal Grandfather    Congestive Heart Failure Maternal Grandfather    Diabetes type I Daughter    Other Daughter        stomach issue   Seizures Son      Social History Kristine Miller reports that she has never smoked. She has never used smokeless tobacco. Kristine Miller reports no history of alcohol use.   Review of Systems CONSTITUTIONAL: No weight loss, fever, chills, weakness or fatigue.  HEENT: Eyes: No visual loss, blurred vision, double vision or yellow sclerae.No hearing loss, sneezing, congestion, runny nose or sore throat.  SKIN: No rash or itching.  CARDIOVASCULAR: per hpi RESPIRATORY: No  shortness of breath, cough or sputum.  GASTROINTESTINAL: No anorexia, nausea, vomiting or diarrhea. No abdominal pain or blood.  GENITOURINARY: No burning on urination, no polyuria NEUROLOGICAL: No headache, dizziness, syncope, paralysis, ataxia, numbness or tingling in the extremities. No change in bowel or bladder control.  MUSCULOSKELETAL: No muscle, back pain, joint pain or stiffness.  LYMPHATICS: No enlarged nodes. No history of splenectomy.  PSYCHIATRIC: No history of depression or anxiety.  ENDOCRINOLOGIC: No reports of sweating, cold or heat intolerance. No polyuria or polydipsia.  Marland Kitchen   Physical Examination Today's Vitals   08/03/23 1032  Pulse: 69  SpO2:  97%  Weight: 232 lb 12.8 oz (105.6 kg)  Height: 5\' 1"  (1.549 m)  Bp  Body mass index is 43.99 kg/m.  Gen: resting comfortably, no acute distress HEENT: no scleral icterus, pupils equal round and reactive, no palptable cervical adenopathy,  CV: RRR, no m/rg no jvd Resp: Clear to auscultation bilaterally GI: abdomen is soft, non-tender, non-distended, normal bowel sounds, no hepatosplenomegaly MSK: extremities are warm, no edema.  Skin: warm, no rash Neuro:  no focal deficits Psych: appropriate affect   Diagnostic Studies   08/2021 echo 1. Left ventricular ejection fraction, by estimation, is 55 to 60%. The  left ventricle has normal function. The left ventricle has no regional  wall motion abnormalities. Left ventricular diastolic parameters are  indeterminate.   2. Right ventricular systolic function is normal. The right ventricular  size is normal. There is normal pulmonary artery systolic pressure. The  estimated right ventricular systolic pressure is 22.7 mmHg.   3. The mitral valve is grossly normal. Trivial mitral valve  regurgitation.   4. The aortic valve is tricuspid. Aortic valve regurgitation is not  visualized. No aortic stenosis is present. Aortic valve mean gradient  measures 3.0 mmHg.   5. The inferior vena cava is normal in size with greater than 50%  respiratory variability, suggesting right atrial pressure of 3 mmHg.    Assessment and Plan  1.Palpitations - EKG today NSR - plan for 7 day monitor  2. Elevated blood pressure - does not have diagnosis of HTN. First elevated bp today, she will bring a bp log to follow up       Antoine Poche, M.D.

## 2023-08-03 NOTE — Patient Instructions (Addendum)
Medication Instructions:   Continue all current medications.   Labwork:  BMET, TSH, MG - orders given  Testing/Procedures:  Your physician has recommended that you wear a 7 day event monitor. Event monitors are medical devices that record the heart's electrical activity. Doctors most often [place] these monitors to diagnose arrhythmias. Arrhythmias are problems with the speed or rhythm of the heartbeat. The monitor is a small, portable device. You can wear one while you do your normal daily activities. This is usually used to diagnose what is causing palpitations/syncope (passing out).   Follow-Up:  Office will contact with results via phone, letter or mychart.    1 month   Any Other Special Instructions Will Be Listed Below (If Applicable).  Please bring BP log with you to next office visit   If you need a refill on your cardiac medications before your next appointment, please call your pharmacy.

## 2023-08-03 NOTE — Telephone Encounter (Signed)
Checking percert on the following patient for    7 day event monitor

## 2023-08-04 LAB — BASIC METABOLIC PANEL
BUN: 14 mg/dL (ref 7–25)
CO2: 29 mmol/L (ref 20–32)
Calcium: 9.4 mg/dL (ref 8.6–10.4)
Chloride: 104 mmol/L (ref 98–110)
Creat: 0.87 mg/dL (ref 0.50–1.05)
Glucose, Bld: 97 mg/dL (ref 65–99)
Potassium: 4.1 mmol/L (ref 3.5–5.3)
Sodium: 141 mmol/L (ref 135–146)

## 2023-08-04 LAB — MAGNESIUM: Magnesium: 2 mg/dL (ref 1.5–2.5)

## 2023-08-04 LAB — TSH: TSH: 3.51 mIU/L (ref 0.40–4.50)

## 2023-08-23 ENCOUNTER — Encounter: Payer: Self-pay | Admitting: *Deleted

## 2023-09-04 ENCOUNTER — Ambulatory Visit: Payer: Self-pay | Admitting: Nurse Practitioner

## 2023-10-17 ENCOUNTER — Encounter: Payer: Self-pay | Admitting: *Deleted

## 2024-04-12 ENCOUNTER — Ambulatory Visit (INDEPENDENT_AMBULATORY_CARE_PROVIDER_SITE_OTHER): Payer: Self-pay | Admitting: Podiatry

## 2024-04-12 ENCOUNTER — Ambulatory Visit (INDEPENDENT_AMBULATORY_CARE_PROVIDER_SITE_OTHER): Payer: Self-pay

## 2024-04-12 ENCOUNTER — Encounter: Payer: Self-pay | Admitting: Podiatry

## 2024-04-12 ENCOUNTER — Other Ambulatory Visit: Payer: Self-pay

## 2024-04-12 DIAGNOSIS — M25572 Pain in left ankle and joints of left foot: Secondary | ICD-10-CM

## 2024-04-12 DIAGNOSIS — M7672 Peroneal tendinitis, left leg: Secondary | ICD-10-CM

## 2024-04-12 DIAGNOSIS — G8929 Other chronic pain: Secondary | ICD-10-CM

## 2024-04-12 DIAGNOSIS — M7662 Achilles tendinitis, left leg: Secondary | ICD-10-CM

## 2024-04-12 NOTE — Progress Notes (Signed)
 Patient presents with complaint of pain in the posterior aspect of the left heel.  Has been bothering her for several weeks now.  Also complains of some pain on the lateral aspect of the ankle indicating along the course of the peroneal tendons.  Quality injury to the area has not noticed any ecchymosis or redness.   Physical exam:  General appearance: Pleasant, and in no acute distress. AOx3.  Vascular: Pedal pulses: DP 2/4, PT 2/4.  Mild edema lower legs bilaterally. Capillary fill time immediate.  Neurological: Light touch intact feet bilaterally.  Normal Achilles reflex bilaterally.  No clonus or spasticity noted.  Negative Tinel's sign tarsal tunnel, sural nerve, porta pedis left  Dermatologic:   Skin normal temperature bilaterally.  Skin normal color, tone, and texture bilaterally.   Musculoskeletal: Tenderness posterior aspect calcaneus.  Tenderness at insertion of the Achilles tendon along the distal 3 to 4 cm of the Achilles tendon.  No tenderness with lateral compression of the calcaneus.  Tenderness along the peroneal tendons at the medial malleolus and retromalleolar.  Some tenderness along the peroneus brevis tendon at its insertion on the fifth metatarsal.  Some tenderness to palpation and range of motion of the subtalar joint.  Radiographs: 3 views foot left: Osteophytic changes in the plantar aspect of the calcaneus and the posterior aspect of the calcaneus.  No disruption of Kagar's triangle.  Normal bone density.  No evidence of bone tumors.  No evidence of fractures.  2 views ankle left: Normal ankle joint space with no degenerative changes noted.  No evidence of any osteochondral lesions.  No evidence of fracture.  Diagnosis: 1.  Achilles tendinitis left 2.  Peroneal tendinitis left. 3.  Joints ankle and rear foot left  Plan: -Just over the Achilles tendinitis and etiology and treatment.  Recommended RICE.  Discussed with the peroneal tendinitis as well as the  arthralgia in the subtalar joint and ankle.  This is probably secondary to compensations for the Achilles tendinitis. -Injected 3cc 2:1 mixture 0.5 cc Marcaine:Kenolog 10mg /52ml at tendon sheath and retrocalcaneal space left.     Return 1 week follow-up injection left

## 2024-04-19 ENCOUNTER — Ambulatory Visit: Payer: Self-pay | Admitting: Podiatry

## 2024-04-24 ENCOUNTER — Ambulatory Visit: Payer: Self-pay | Admitting: Podiatry

## 2024-04-26 ENCOUNTER — Ambulatory Visit (INDEPENDENT_AMBULATORY_CARE_PROVIDER_SITE_OTHER): Payer: Self-pay | Admitting: Podiatry

## 2024-04-26 DIAGNOSIS — M7661 Achilles tendinitis, right leg: Secondary | ICD-10-CM

## 2024-04-26 MED ORDER — TRIAMCINOLONE ACETONIDE 10 MG/ML IJ SUSP
10.0000 mg | Freq: Once | INTRAMUSCULAR | Status: AC
Start: 2024-04-26 — End: 2024-04-26
  Administered 2024-04-26: 10 mg

## 2024-04-26 NOTE — Progress Notes (Signed)
 Presents follow-up injection Achilles tendon sheath posterior heel right foot.  Very well for the first several days but in the past 2 or 3 days it started to get painful again.  Is been icing it 3-4 times a day for 20 minutes   Physical exam:  General appearance: Pleasant, and in no acute distress. AOx3.  Vascular: Pedal pulses: DP 2/4 bilaterally, PT 2/4 bilaterally.  Mild edema lower legs bilaterally. Capillary fill time immediate.  Neurological: Light touch intact feet bilaterally.  Normal Achilles reflex bilaterally.  No clonus or spasticity noted.   Dermatologic:   Skin normal temperature bilaterally.  Skin normal color, tone, and texture bilaterally.   Musculoskeletal: Posterior aspect heel and distal 4 to 5 cm of the Achilles tendon especially on the medial aspect right foot.  No defects in Achilles tendon noted.   Diagnosis: 1.  Achilles tendinitis right-some improvement  Plan: -Continue icing as needed.  Also recommended Voltaren gel applied 3-4 times a day to affected area -injected 3cc 2:1 mixture 0.5 cc Marcaine:Kenolog 10mg /32ml at Achilles tendon sheath and retrocalcaneal space right.   Return 2 weeks follow-up injection

## 2024-05-10 ENCOUNTER — Ambulatory Visit: Payer: Self-pay | Admitting: Podiatry

## 2024-05-14 ENCOUNTER — Ambulatory Visit: Payer: Self-pay | Admitting: Podiatry

## 2024-05-22 ENCOUNTER — Encounter: Payer: Self-pay | Admitting: Podiatry

## 2024-05-22 ENCOUNTER — Ambulatory Visit (INDEPENDENT_AMBULATORY_CARE_PROVIDER_SITE_OTHER): Payer: Self-pay | Admitting: Podiatry

## 2024-05-22 DIAGNOSIS — M7662 Achilles tendinitis, left leg: Secondary | ICD-10-CM

## 2024-05-22 NOTE — Progress Notes (Signed)
 Presents for follow-up Achilles tendinitis left.  She is doing much better probably about 80 to 85% better.  Still has some soreness is more of an ache now and a sharp sensation.  She has been using OTC Voltaren gel and another inflammatory anti-inflammatory balm that she is not sure the name.  Also still icing it.  Physical exam:  General appearance: Pleasant, and in no acute distress. AOx3.  Vascular: Pedal pulses: DP 2/4 bilaterally, PT 2/4 bilaterally.  Mild edema lower legs bilaterally. Capillary fill time immediate.  Neurological: Grossly intact  Dermatologic:   Skin normal temperature bilaterally.  Skin normal color, tone, and texture bilaterally.   Musculoskeletal: Slight tenderness to the posterior aspect of the heel at the insertion of the Achilles tendon left.  Slight tenderness in the distal 2 cm of the Achilles left.  No defects in Achilles tendon palpable.    Diagnosis: 1.  Achilles tendinitis left-improved  Plan: -Established office visit for evaluation and management level 3. - Discussed with her continued icing 20 minutes an hour several times a day.  Continue Voltaren gel.  Discussed OTC NSAIDs.  Also discussed prednisone  but she gets very strong reactions with prednisone .  Told her to give it another 4 to 5 weeks and if it is doing well she is she does come back as needed.  If not call and we can try another injection  Return prn

## 2024-07-24 ENCOUNTER — Ambulatory Visit (INDEPENDENT_AMBULATORY_CARE_PROVIDER_SITE_OTHER): Payer: Self-pay | Admitting: Podiatry

## 2024-07-24 DIAGNOSIS — M7672 Peroneal tendinitis, left leg: Secondary | ICD-10-CM

## 2024-07-24 DIAGNOSIS — M7662 Achilles tendinitis, left leg: Secondary | ICD-10-CM

## 2024-07-24 NOTE — Progress Notes (Signed)
 Patient presents for follow-up Achilles tendinitis on the left it was getting better and she is got up to about 90% better and then began to regress again.  Has been hurting her more.  She says she felt a pop and it began hurting a lot also is complaining of pain at lateral aspect of the ankle.  Has not noticed any redness or ecchymosis or swelling.   Physical exam:  General appearance: Pleasant, and in no acute distress. AOx3.  Vascular: Pedal pulses: DP 2/4 bilaterally, PT 2/4 bilaterally.  Mild to moderate edema lower legs bilaterally. Capillary fill time immediate bilaterally.  Neurological: Grossly intact bilaterally  Dermatologic:   Skin normal temperature bilaterally.  Skin normal color, tone, and texture bilaterally.   Musculoskeletal: Tenderness along distal Achilles tendon left at the insertion.  No defects in Achilles tendon noted.  Tenderness along the peroneal tendons at the retromalleolar area.  Good strength and eversion against resistance however there is tenderness because when she tries to evert against resistance.   Diagnosis: 1.  Achilles tendinitis left 2.  Peroneal tendinitis left  Plan: -S/p visit for evaluation and management level 3. - Discussed over continued pain at the Achilles tendon.  Had been getting better but now it started to regress again.  Will try pneumatic cast today on the left and see how she does with this over 4 weeks continue to ice and stretch as she has been doing.  Discussed with her also doing another corticosteroid injection.  PRP injection could also be considered. -Dispensed pneumatic cast left  Return 4 weeks follow-up Achilles tendinitis left

## 2024-08-22 ENCOUNTER — Ambulatory Visit (INDEPENDENT_AMBULATORY_CARE_PROVIDER_SITE_OTHER): Payer: Self-pay | Admitting: Podiatry

## 2024-08-22 DIAGNOSIS — M7662 Achilles tendinitis, left leg: Secondary | ICD-10-CM

## 2024-08-22 MED ORDER — IBUPROFEN 800 MG PO TABS
800.0000 mg | ORAL_TABLET | Freq: Three times a day (TID) | ORAL | 0 refills | Status: AC
Start: 2024-08-22 — End: ?

## 2024-08-22 NOTE — Progress Notes (Signed)
 Patient presents follow-up Achilles tendon explant.  The been using the pneumatic cast.  For weightbearing except when she is in the house.  This is about 30% better overall but still fairly tender.   Physical exam:  General appearance: Pleasant, and in no acute distress. AOx3.  Vascular: Pedal pulses: DP 2/4 bilaterally, PT 2/4 bilaterally.  Mild edema lower legs bilaterally. Capillary fill time immediate left.  Neurological: Grossly intact bilaterally  Dermatologic:   Skin normal temperature bilaterally.  Skin normal color, tone, and texture bilaterally.   Musculoskeletal: Tenderness posterior aspect heel at the insertion Achilles tendon.  Tenderness along the distal 3 to 4 cm of the Achilles tendon.  No defects noted left Achilles tendon.  \  Diagnosis: 1.  Achilles tendinitis right  Plan: -Established office visit for evaluation and management level 3. - Discussed with her the continued Achilles tendinitis.  Is improved about 30%.  Will continue using the boot for another 4 weeks.  Instructed to wear the boot for all weightbearing.  In addition we will add ibuprofen 800 mg 3 times daily.  She had some swelling increase swelling along the lateral ankle probably due to some peroneal tendon inflammation.  The ibuprofen should help keepperoneal tendinitis in check also wearing the boot. -Discussed with her other options if this does not work.  Discussed PRP with her.  Discussed Topaz microdebrider along with a gastrocnemius recession.  Also discussed open surgery on the Achilles tendon and posterior calcaneus  Return 4 weeks follow-up Achilles tendinitis right

## 2024-09-19 ENCOUNTER — Ambulatory Visit (INDEPENDENT_AMBULATORY_CARE_PROVIDER_SITE_OTHER): Payer: Self-pay | Admitting: Podiatry

## 2024-09-19 ENCOUNTER — Encounter: Payer: Self-pay | Admitting: Podiatry

## 2024-09-19 DIAGNOSIS — M7661 Achilles tendinitis, right leg: Secondary | ICD-10-CM

## 2024-09-19 NOTE — Progress Notes (Signed)
 Patient presents still complaining of pain at the Achilles tendon posterior heel right.  Still having discomfort.  No significant improvement due to using the pneumatic cast.  She continues to do her exercises at home.  Does have MS mostly affecting the left side..  We discussed physical therapy but she looked into it and she is not can to be able to afford her insurance.   Physical exam:  General appearance: Pleasant, and in no acute distress. AOx3.  Vascular: Pedal pulses: DP 2/4 bilaterally, PT 2/4 bilaterally. Mild edema lower legs bilaterally. Capillary fill time immediate bilaterally.  Neurological: Grossly intact bilateral  Dermatologic:   Skin normal temperature bilaterally.  Skin normal color, tone, and texture bilaterally.   Musculoskeletal: Tenderness posterior aspect heel at insertion Achilles tendon right.  Numbness along distal 3 to 4 cm of Achilles tendon.  No defects palpable.   Diagnosis: 1.  Achilles tendinitis right  Plan: -Established office visit for evaluation and management level 3 -Discussed with patient the continued Achilles tendinitis on the right.  She has had injections, immobilization, exercises at home, NSAIDs, and prednisone  with only short-term and intermittent relief.  She looked into PT however with her insurance she is unable to afford it.  Discussed PRP injections however he will not be able to afford this, as insurance does not cover it.  Given the amount of pain she continues to have, I would recommend consult with one of our surgeons for corrective surgery.   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

## 2024-09-27 ENCOUNTER — Encounter: Payer: Self-pay | Admitting: Podiatry

## 2024-09-27 ENCOUNTER — Institutional Professional Consult (permissible substitution) (INDEPENDENT_AMBULATORY_CARE_PROVIDER_SITE_OTHER): Payer: Self-pay | Admitting: Podiatry

## 2024-09-27 DIAGNOSIS — M9262 Juvenile osteochondrosis of tarsus, left ankle: Secondary | ICD-10-CM

## 2024-09-27 DIAGNOSIS — M7662 Achilles tendinitis, left leg: Secondary | ICD-10-CM

## 2024-09-27 NOTE — Progress Notes (Signed)
 Subjective:  Patient ID: Kristine Miller, female    DOB: Mar 30, 1961,  MRN: [id number]  Chief Complaint  Patient presents with   Foot Pain    Patient is here for surgical consult for Achilles tendinitis of right lower extremity      63 y.o. female presents with the above complaint.  Patient presents with continuous pain to the left Achilles tendon.  Hurts with ambulation and shoe pressure she has been to surgery by Dr. Gabriel has progressive gotten worse.  She wishes to undergo surgical intervention she has failed all conservative care including shoe gear modification padding protecting offloading she wishes to undergo surgical intervention at this time pain scale 7 out of 10 dull aching nature   Review of Systems: Negative except as noted in the HPI. Denies N/V/F/Ch.  Past Medical History:  Diagnosis Date   Anxiety    Asthma    Chronic headaches    IBS (irritable bowel syndrome)    MS (multiple sclerosis) 2014   Palpitations    Pneumonia    Rotator cuff arthropathy, left     Current Outpatient Medications:    Aspirin -Salicylamide-Caffeine  (BC HEADACHE POWDER PO), Take 1 packet by mouth as needed (pain)., Disp: , Rfl:    docusate (COLACE) 60 MG/15ML syrup, Take 60 mg by mouth daily., Disp: , Rfl:    ibuprofen  (ADVIL ) 800 MG tablet, Take 1 tablet (800 mg total) by mouth 3 (three) times daily., Disp: 30 tablet, Rfl: 0  Social History   Tobacco Use  Smoking Status Never  Smokeless Tobacco Never    Allergies  Allergen Reactions   Morphine  And Codeine Swelling   Penicillins Nausea And Vomiting    Has patient had a PCN reaction causing immediate rash, facial/tongue/throat swelling, SOB or lightheadedness with hypotension: No Has patient had a PCN reaction causing severe rash involving mucus membranes or skin necrosis: No Has patient had a PCN reaction that required hospitalization No Has patient had a PCN reaction occurring within the last 10 years: No If all of the above  answers are NO, then may proceed with Cephalosporin use.    Prednisone  Nausea And Vomiting   Objective:  There were no vitals filed for this visit. There is no height or weight on file to calculate BMI. Constitutional Well developed. Well nourished.  Vascular Dorsalis pedis pulses palpable bilaterally. Posterior tibial pulses palpable bilaterally. Capillary refill normal to all digits.  No cyanosis or clubbing noted. Pedal hair growth normal.  Neurologic Normal speech. Oriented to person, place, and time. Epicritic sensation to light touch grossly present bilaterally.  Dermatologic Nails well groomed and normal in appearance. No open wounds. No skin lesions.  Orthopedic: Pain on palpation left Achilles tendon insertional positive Haglund's deformity noted positive gastrocnemius equinus noted.  Pain with dorsiflexion of the ankle joint no pain with plantarflexion of the ankle joint no pain at the posterior tibial tendon peroneal tendon ATFL ligament.  No deep intra-articular ankle pain noted.   Radiographs: 3 views of skeletally mature adult left foot reviewed: Posterior heel spurring noted with Haglund's deformity noted.  Plantar heel spurring noted midfoot arthritis noted no other abnormalities identified Assessment:   1. Achilles tendinitis of left lower extremity   2. Haglund's deformity, left    Plan:  Patient was evaluated and treated and all questions answered.  Left Achilles tendinitis with underlying Haglund's deformity and gastroc recession  - All questions and concerns were discussed with the patient in extensive detail given the amount of pain  that she is experiencing should benefit from surgical intervention I discussed my preoperative entire postoperative plan with the patient in extensive detail she states understanding like to proceed with surgery I discussed that she would benefit from left Haglund's deformity resection with Achilles tendon repair with gastrocnemius  recession. -Informed surgical risk consent was reviewed and read aloud to the patient.  I reviewed the films.  I have discussed my findings with the patient in great detail.  I have discussed all risks including but not limited to infection, stiffness, scarring, limp, disability, deformity, damage to blood vessels and nerves, numbness, poor healing, need for braces, arthritis, chronic pain, amputation, death.  All benefits and realistic expectations discussed in great detail.  I have made no promises as to the outcome.  I have provided realistic expectations.  I have offered the patient a 2nd opinion, which they have declined and assured me they preferred to proceed despite the risks   No follow-ups on file.

## 2024-10-29 ENCOUNTER — Telehealth: Payer: Self-pay | Admitting: Podiatry

## 2024-10-29 NOTE — Telephone Encounter (Signed)
 Patient tentatively scheduled for surgery in February and she is wanting to know if she should continue to wear the boot she received at her last appointment.
